# Patient Record
Sex: Male | Born: 1960
Health system: Southern US, Community
[De-identification: ages and names within clinical notes are randomized; demographics above are authoritative.]

## PROBLEM LIST (undated history)

## (undated) DIAGNOSIS — I509 Heart failure, unspecified: Secondary | ICD-10-CM

## (undated) DIAGNOSIS — K219 Gastro-esophageal reflux disease without esophagitis: Secondary | ICD-10-CM

## (undated) DIAGNOSIS — G4733 Obstructive sleep apnea (adult) (pediatric): Secondary | ICD-10-CM

## (undated) DIAGNOSIS — M1990 Unspecified osteoarthritis, unspecified site: Secondary | ICD-10-CM

## (undated) DIAGNOSIS — R809 Proteinuria, unspecified: Secondary | ICD-10-CM

## (undated) DIAGNOSIS — I10 Essential (primary) hypertension: Secondary | ICD-10-CM

## (undated) DIAGNOSIS — J45909 Unspecified asthma, uncomplicated: Secondary | ICD-10-CM

## (undated) DIAGNOSIS — N281 Cyst of kidney, acquired: Secondary | ICD-10-CM

## (undated) DIAGNOSIS — E785 Hyperlipidemia, unspecified: Secondary | ICD-10-CM

## (undated) HISTORY — DX: Gastro-esophageal reflux disease without esophagitis: K21.9

## (undated) HISTORY — DX: Proteinuria, unspecified: R80.9

## (undated) HISTORY — DX: Morbid (severe) obesity due to excess calories: E66.01

## (undated) HISTORY — PX: OTHER SURGICAL HISTORY: SHX169

## (undated) HISTORY — DX: Unspecified osteoarthritis, unspecified site: M19.90

## (undated) HISTORY — DX: Heart failure, unspecified: I50.9

## (undated) HISTORY — DX: Hyperlipidemia, unspecified: E78.5

## (undated) HISTORY — DX: Obstructive sleep apnea (adult) (pediatric): G47.33

---
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
## 2004-10-21 ENCOUNTER — Emergency Department (HOSPITAL_COMMUNITY): Admission: EM | Admit: 2004-10-21 | Discharge: 2004-10-21 | Payer: Self-pay | Admitting: Emergency Medicine

## 2006-11-06 ENCOUNTER — Emergency Department (HOSPITAL_COMMUNITY): Admission: EM | Admit: 2006-11-06 | Discharge: 2006-11-06 | Payer: Self-pay | Admitting: Emergency Medicine

## 2006-11-24 ENCOUNTER — Inpatient Hospital Stay (HOSPITAL_COMMUNITY): Admission: EM | Admit: 2006-11-24 | Discharge: 2006-12-01 | Payer: Self-pay | Admitting: Emergency Medicine

## 2006-11-24 ENCOUNTER — Encounter: Admission: RE | Admit: 2006-11-24 | Discharge: 2006-11-24 | Payer: Self-pay | Admitting: Orthopedic Surgery

## 2006-11-25 ENCOUNTER — Encounter: Payer: Self-pay | Admitting: Vascular Surgery

## 2006-11-25 ENCOUNTER — Ambulatory Visit: Payer: Self-pay | Admitting: Infectious Diseases

## 2006-11-25 ENCOUNTER — Ambulatory Visit: Payer: Self-pay | Admitting: Vascular Surgery

## 2006-11-26 ENCOUNTER — Encounter (INDEPENDENT_AMBULATORY_CARE_PROVIDER_SITE_OTHER): Payer: Self-pay | Admitting: *Deleted

## 2009-11-06 ENCOUNTER — Ambulatory Visit: Payer: Self-pay | Admitting: Internal Medicine

## 2009-11-06 ENCOUNTER — Inpatient Hospital Stay (HOSPITAL_COMMUNITY): Admission: AD | Admit: 2009-11-06 | Discharge: 2009-11-14 | Payer: Self-pay | Admitting: Internal Medicine

## 2009-11-07 ENCOUNTER — Encounter (INDEPENDENT_AMBULATORY_CARE_PROVIDER_SITE_OTHER): Payer: Self-pay | Admitting: Internal Medicine

## 2009-11-07 ENCOUNTER — Ambulatory Visit: Payer: Self-pay | Admitting: Vascular Surgery

## 2009-11-08 ENCOUNTER — Encounter (INDEPENDENT_AMBULATORY_CARE_PROVIDER_SITE_OTHER): Payer: Self-pay | Admitting: Internal Medicine

## 2010-02-15 ENCOUNTER — Ambulatory Visit (HOSPITAL_BASED_OUTPATIENT_CLINIC_OR_DEPARTMENT_OTHER): Admission: RE | Admit: 2010-02-15 | Discharge: 2010-02-15 | Payer: Self-pay | Admitting: Internal Medicine

## 2010-02-28 ENCOUNTER — Ambulatory Visit: Payer: Self-pay | Admitting: Pulmonary Disease

## 2010-12-02 LAB — URINALYSIS, MICROSCOPIC ONLY
Ketones, ur: NEGATIVE mg/dL
Nitrite: NEGATIVE
Specific Gravity, Urine: 1.013 (ref 1.005–1.030)
Urobilinogen, UA: 1 mg/dL (ref 0.0–1.0)

## 2010-12-02 LAB — CBC
HCT: 44 % (ref 39.0–52.0)
HCT: 44.5 % (ref 39.0–52.0)
HCT: 46.8 % (ref 39.0–52.0)
HCT: 49.5 % (ref 39.0–52.0)
HCT: 52.1 % — ABNORMAL HIGH (ref 39.0–52.0)
Hemoglobin: 12.8 g/dL — ABNORMAL LOW (ref 13.0–17.0)
Hemoglobin: 13.3 g/dL (ref 13.0–17.0)
Hemoglobin: 13.6 g/dL (ref 13.0–17.0)
Hemoglobin: 13.8 g/dL (ref 13.0–17.0)
Hemoglobin: 14.7 g/dL (ref 13.0–17.0)
Hemoglobin: 15.3 g/dL (ref 13.0–17.0)
MCHC: 29 g/dL — ABNORMAL LOW (ref 30.0–36.0)
MCHC: 29.4 g/dL — ABNORMAL LOW (ref 30.0–36.0)
MCHC: 29.4 g/dL — ABNORMAL LOW (ref 30.0–36.0)
MCHC: 29.5 g/dL — ABNORMAL LOW (ref 30.0–36.0)
MCHC: 30.4 g/dL (ref 30.0–36.0)
MCHC: 30.6 g/dL (ref 30.0–36.0)
MCV: 72.8 fL — ABNORMAL LOW (ref 78.0–100.0)
MCV: 72.9 fL — ABNORMAL LOW (ref 78.0–100.0)
MCV: 73.1 fL — ABNORMAL LOW (ref 78.0–100.0)
MCV: 73.2 fL — ABNORMAL LOW (ref 78.0–100.0)
MCV: 73.8 fL — ABNORMAL LOW (ref 78.0–100.0)
Platelets: 224 10*3/uL (ref 150–400)
Platelets: 233 10*3/uL (ref 150–400)
Platelets: 248 10*3/uL (ref 150–400)
Platelets: 261 10*3/uL (ref 150–400)
Platelets: 329 K/uL (ref 150–400)
Platelets: 332 10*3/uL (ref 150–400)
Platelets: 345 10*3/uL (ref 150–400)
Platelets: 354 10*3/uL (ref 150–400)
RBC: 6.22 MIL/uL — ABNORMAL HIGH (ref 4.22–5.81)
RBC: 6.27 MIL/uL — ABNORMAL HIGH (ref 4.22–5.81)
RBC: 6.62 MIL/uL — ABNORMAL HIGH (ref 4.22–5.81)
RBC: 7.13 MIL/uL — ABNORMAL HIGH (ref 4.22–5.81)
RBC: 7.15 MIL/uL — ABNORMAL HIGH (ref 4.22–5.81)
RDW: 23.3 % — ABNORMAL HIGH (ref 11.5–15.5)
RDW: 23.6 % — ABNORMAL HIGH (ref 11.5–15.5)
RDW: 23.9 % — ABNORMAL HIGH (ref 11.5–15.5)
RDW: 24 % — ABNORMAL HIGH (ref 11.5–15.5)
RDW: 24 % — ABNORMAL HIGH (ref 11.5–15.5)
RDW: 24 % — ABNORMAL HIGH (ref 11.5–15.5)
RDW: 25.2 % — ABNORMAL HIGH (ref 11.5–15.5)
WBC: 11.7 10*3/uL — ABNORMAL HIGH (ref 4.0–10.5)
WBC: 11.7 10*3/uL — ABNORMAL HIGH (ref 4.0–10.5)
WBC: 11.9 10*3/uL — ABNORMAL HIGH (ref 4.0–10.5)
WBC: 12.6 K/uL — ABNORMAL HIGH (ref 4.0–10.5)
WBC: 6.8 10*3/uL (ref 4.0–10.5)
WBC: 9.5 10*3/uL (ref 4.0–10.5)

## 2010-12-02 LAB — VITAMIN B12: Vitamin B-12: >2000 pg/mL — ABNORMAL HIGH (ref 211–911)

## 2010-12-02 LAB — LEGIONELLA ANTIGEN, URINE: Legionella Antigen, Urine: NEGATIVE

## 2010-12-02 LAB — URINALYSIS, ROUTINE W REFLEX MICROSCOPIC
Glucose, UA: NEGATIVE mg/dL
Ketones, ur: NEGATIVE mg/dL
Leukocytes, UA: NEGATIVE
Nitrite: NEGATIVE
Protein, ur: >300 mg/dL — AB
Specific Gravity, Urine: 1.016 (ref 1.005–1.030)
Urobilinogen, UA: 1 mg/dL (ref 0.0–1.0)
pH: 5 (ref 5.0–8.0)

## 2010-12-02 LAB — CARDIAC PANEL(CRET KIN+CKTOT+MB+TROPI)
CK, MB: 3.5 ng/mL (ref 0.3–4.0)
CK, MB: 5.5 ng/mL — ABNORMAL HIGH (ref 0.3–4.0)
CK, MB: 5.8 ng/mL — ABNORMAL HIGH (ref 0.3–4.0)
Relative Index: 2 (ref 0.0–2.5)
Total CK: 128 U/L (ref 7–232)
Total CK: 281 U/L — ABNORMAL HIGH (ref 7–232)
Troponin I: 0.47 ng/mL — ABNORMAL HIGH (ref 0.00–0.06)
Troponin I: 0.67 ng/mL (ref 0.00–0.06)

## 2010-12-02 LAB — PHOSPHORUS: Phosphorus: 5 mg/dL — ABNORMAL HIGH (ref 2.3–4.6)

## 2010-12-02 LAB — BASIC METABOLIC PANEL
BUN: 20 mg/dL (ref 6–23)
BUN: 29 mg/dL — ABNORMAL HIGH (ref 6–23)
BUN: 40 mg/dL — ABNORMAL HIGH (ref 6–23)
BUN: 46 mg/dL — ABNORMAL HIGH (ref 6–23)
BUN: 54 mg/dL — ABNORMAL HIGH (ref 6–23)
CO2: 40 mEq/L — ABNORMAL HIGH (ref 19–32)
CO2: 42 mEq/L (ref 19–32)
CO2: 44 mEq/L (ref 19–32)
Calcium: 7.8 mg/dL — ABNORMAL LOW (ref 8.4–10.5)
Calcium: 8.1 mg/dL — ABNORMAL LOW (ref 8.4–10.5)
Calcium: 8.4 mg/dL (ref 8.4–10.5)
Chloride: 90 mEq/L — ABNORMAL LOW (ref 96–112)
Chloride: 93 mEq/L — ABNORMAL LOW (ref 96–112)
Creatinine, Ser: 1.25 mg/dL (ref 0.4–1.5)
Creatinine, Ser: 1.42 mg/dL (ref 0.4–1.5)
Creatinine, Ser: 1.56 mg/dL — ABNORMAL HIGH (ref 0.4–1.5)
Creatinine, Ser: 1.66 mg/dL — ABNORMAL HIGH (ref 0.4–1.5)
GFR calc Af Amer: 58 mL/min — ABNORMAL LOW (ref >60)
GFR calc Af Amer: >60 mL/min (ref >60)
GFR calc non Af Amer: 38 mL/min — ABNORMAL LOW (ref >60)
GFR calc non Af Amer: 53 mL/min — ABNORMAL LOW (ref >60)
GFR calc non Af Amer: >60 mL/min (ref >60)
Glucose, Bld: 131 mg/dL — ABNORMAL HIGH (ref 70–99)
Glucose, Bld: 84 mg/dL (ref 70–99)
Glucose, Bld: 85 mg/dL (ref 70–99)
Glucose, Bld: 85 mg/dL (ref 70–99)
Potassium: 3.2 mEq/L — ABNORMAL LOW (ref 3.5–5.1)
Potassium: 3.3 mEq/L — ABNORMAL LOW (ref 3.5–5.1)
Sodium: 145 mEq/L (ref 135–145)

## 2010-12-02 LAB — RENAL FUNCTION PANEL
Albumin: 2.6 g/dL — ABNORMAL LOW (ref 3.5–5.2)
BUN: 64 mg/dL — ABNORMAL HIGH (ref 6–23)
BUN: 80 mg/dL — ABNORMAL HIGH (ref 6–23)
CO2: 32 mEq/L (ref 19–32)
CO2: 37 mEq/L — ABNORMAL HIGH (ref 19–32)
Calcium: 8.2 mg/dL — ABNORMAL LOW (ref 8.4–10.5)
Calcium: 8.3 mg/dL — ABNORMAL LOW (ref 8.4–10.5)
Chloride: 91 mEq/L — ABNORMAL LOW (ref 96–112)
Creatinine, Ser: 2.37 mg/dL — ABNORMAL HIGH (ref 0.4–1.5)
GFR calc Af Amer: 36 mL/min — ABNORMAL LOW (ref >60)
GFR calc non Af Amer: 27 mL/min — ABNORMAL LOW (ref >60)
GFR calc non Af Amer: 29 mL/min — ABNORMAL LOW (ref >60)
Glucose, Bld: 70 mg/dL (ref 70–99)
Glucose, Bld: 77 mg/dL (ref 70–99)
Phosphorus: 4.6 mg/dL (ref 2.3–4.6)
Potassium: 3.2 mEq/L — ABNORMAL LOW (ref 3.5–5.1)
Potassium: 3.5 mEq/L (ref 3.5–5.1)
Sodium: 143 mEq/L (ref 135–145)

## 2010-12-02 LAB — BLOOD GAS, ARTERIAL
Bicarbonate: 30.8 mEq/L — ABNORMAL HIGH (ref 20.0–24.0)
Bicarbonate: 30.8 mEq/L — ABNORMAL HIGH (ref 20.0–24.0)
Bicarbonate: 32 mEq/L — ABNORMAL HIGH (ref 20.0–24.0)
Expiratory PAP: 5
FIO2: 0.4 %
Inspiratory PAP: 12
O2 Saturation: 92.2 %
O2 Saturation: 93.3 %
Patient temperature: 98.6
Patient temperature: 98.6
TCO2: 33.3 mmol/L (ref 0–100)
TCO2: 33.9 mmol/L (ref 0–100)
pCO2 arterial: 87.2 mmHg (ref 35.0–45.0)
pO2, Arterial: 60.6 mmHg — ABNORMAL LOW (ref 80.0–100.0)
pO2, Arterial: 84.1 mmHg (ref 80.0–100.0)

## 2010-12-02 LAB — COMPREHENSIVE METABOLIC PANEL
ALT: 141 U/L — ABNORMAL HIGH (ref 0–53)
ALT: 80 U/L — ABNORMAL HIGH (ref 0–53)
ALT: 99 U/L — ABNORMAL HIGH (ref 0–53)
AST: 141 U/L — ABNORMAL HIGH (ref 0–37)
AST: 41 U/L — ABNORMAL HIGH (ref 0–37)
AST: 69 U/L — ABNORMAL HIGH (ref 0–37)
Albumin: 2.8 g/dL — ABNORMAL LOW (ref 3.5–5.2)
Albumin: 3.1 g/dL — ABNORMAL LOW (ref 3.5–5.2)
Albumin: 3.2 g/dL — ABNORMAL LOW (ref 3.5–5.2)
Alkaline Phosphatase: 100 U/L (ref 39–117)
Alkaline Phosphatase: 118 U/L — ABNORMAL HIGH (ref 39–117)
Alkaline Phosphatase: 124 U/L — ABNORMAL HIGH (ref 39–117)
CO2: 26 mEq/L (ref 19–32)
CO2: 28 mEq/L (ref 19–32)
Calcium: 7.4 mg/dL — ABNORMAL LOW (ref 8.4–10.5)
Calcium: 8.7 mg/dL (ref 8.4–10.5)
Chloride: 90 mEq/L — ABNORMAL LOW (ref 96–112)
Chloride: 92 mEq/L — ABNORMAL LOW (ref 96–112)
Chloride: 92 mEq/L — ABNORMAL LOW (ref 96–112)
Creatinine, Ser: 5.03 mg/dL — ABNORMAL HIGH (ref 0.4–1.5)
Creatinine, Ser: 5.2 mg/dL — ABNORMAL HIGH (ref 0.4–1.5)
GFR calc Af Amer: 13 mL/min — ABNORMAL LOW (ref >60)
GFR calc Af Amer: 14 mL/min — ABNORMAL LOW (ref >60)
GFR calc Af Amer: 15 mL/min — ABNORMAL LOW (ref >60)
GFR calc non Af Amer: 10 mL/min — ABNORMAL LOW (ref >60)
GFR calc non Af Amer: 12 mL/min — ABNORMAL LOW (ref >60)
Glucose, Bld: 122 mg/dL — ABNORMAL HIGH (ref 70–99)
Potassium: 4.2 mEq/L (ref 3.5–5.1)
Potassium: 4.4 mEq/L (ref 3.5–5.1)
Potassium: 4.9 mEq/L (ref 3.5–5.1)
Sodium: 133 mEq/L — ABNORMAL LOW (ref 135–145)
Sodium: 133 mEq/L — ABNORMAL LOW (ref 135–145)
Sodium: 135 mEq/L (ref 135–145)
Total Bilirubin: 2 mg/dL — ABNORMAL HIGH (ref 0.3–1.2)
Total Bilirubin: 2.1 mg/dL — ABNORMAL HIGH (ref 0.3–1.2)
Total Protein: 6.1 g/dL (ref 6.0–8.3)
Total Protein: 6.6 g/dL (ref 6.0–8.3)
Total Protein: 6.7 g/dL (ref 6.0–8.3)

## 2010-12-02 LAB — HEPARIN LEVEL (UNFRACTIONATED)
Heparin Unfractionated: 0.1 [IU]/mL — ABNORMAL LOW (ref 0.30–0.70)
Heparin Unfractionated: 0.36 IU/mL (ref 0.30–0.70)
Heparin Unfractionated: 0.46 IU/mL (ref 0.30–0.70)
Heparin Unfractionated: 0.47 IU/mL (ref 0.30–0.70)
Heparin Unfractionated: 1.07 IU/mL — ABNORMAL HIGH (ref 0.30–0.70)
Heparin Unfractionated: <0.1 IU/mL — ABNORMAL LOW (ref 0.30–0.70)

## 2010-12-02 LAB — URINE MICROSCOPIC-ADD ON

## 2010-12-02 LAB — HEMOGLOBINOPATHY EVALUATION
Hemoglobin Other: 0 % (ref 0.0–0.0)
Hgb A: 97.9 % — ABNORMAL HIGH (ref 96.8–97.8)

## 2010-12-02 LAB — LIPID PANEL
LDL Cholesterol: 68 mg/dL (ref 0–99)
Total CHOL/HDL Ratio: 4.5 RATIO
Triglycerides: 134 mg/dL (ref <150)
VLDL: 27 mg/dL (ref 0–40)

## 2010-12-02 LAB — RETICULOCYTES
RBC.: 6.44 MIL/uL — ABNORMAL HIGH (ref 4.22–5.81)
Retic Count, Absolute: 77.3 10*3/uL (ref 19.0–186.0)
Retic Ct Pct: 1.2 % (ref 0.4–3.1)

## 2010-12-02 LAB — IRON AND TIBC
Iron: 23 ug/dL — ABNORMAL LOW (ref 42–135)
Saturation Ratios: 8 % — ABNORMAL LOW (ref 20–55)
TIBC: 293 ug/dL (ref 215–435)
UIBC: 270 ug/dL

## 2010-12-02 LAB — MAGNESIUM
Magnesium: 1.2 mg/dL — ABNORMAL LOW (ref 1.5–2.5)
Magnesium: 1.6 mg/dL (ref 1.5–2.5)
Magnesium: 1.9 mg/dL (ref 1.5–2.5)

## 2010-12-02 LAB — STREP PNEUMONIAE URINARY ANTIGEN: Strep Pneumo Urinary Antigen: NEGATIVE

## 2010-12-02 LAB — URINE CULTURE

## 2010-12-02 LAB — CULTURE, BLOOD (ROUTINE X 2)

## 2010-12-02 LAB — BRAIN NATRIURETIC PEPTIDE: Pro B Natriuretic peptide (BNP): 339 pg/mL — ABNORMAL HIGH (ref 0.0–100.0)

## 2011-01-24 NOTE — Procedures (Signed)
EEG#:  [id number].   CLINICAL HISTORY:  A 50 year old man with a history of fall.  EEG is  performed for evaluation.  This is a portable EEG done without photic  stimulation or hyperventilation.  The patient is described as awake.   ATTENDING PHYSICIAN:  Dr. Waymon Amato.   DESCRIPTION:  The dominant rhythm of this tracing is a moderate  amplitude alpha rhythm of 9-10 Hz which predominates posteriorly without  abnormal asymmetry and attenuates with eye opening and closing.  Abundant low-amplitude fast activity is seen frontally and centrally and  it appears without abnormal asymmetry.  No focal slowing is noted.  No  epileptiform discharges are seen.  The patient remained in the awake  state throughout the recording.  Photic stimulation and hyperventilation  were not performed.  A single-channel devoted EKG revealed sinus rhythm  throughout with a rate of approximately 96 beats per minute.   CONCLUSIONS:  A normal study in awake state.      Michael L. Thad Ranger, M.D.  Electronically Signed     BJY:NWGN  D:  11/26/2006 14:07:00  T:  11/26/2006 14:23:09  Job #:  [id number]

## 2011-01-24 NOTE — Consult Note (Signed)
NAME:  [name], [name] NO.:  [id number]   MEDICAL RECORD NO.:  [id number]          PATIENT TYPE:  INP   LOCATION:  0103                         FACILITY:  [facility]   PHYSICIAN:  Marcellus Scott, MD     DATE OF BIRTH:  1961/02/03   DATE OF CONSULTATION:  DATE OF DISCHARGE:                                 CONSULTATION   IDENTIFICATION:  The patient is unassigned to the [facility].  [facility]   Referring physician is Georges Lynch. Darrelyn Hillock, M.D., orthopedics.   CHIEF COMPLAINT:  right lower extremity pain, redness, swelling, and  fever.   HISTORY OF PRESENT ILLNESS:  The patient is a 50 year old pleasant  African-American male patient with past medical history of untreated  hypertension, childhood asthma, obesity.  He has been admitted by the  orthopedic team for evaluation and management of his right lower  extremity cellulitis.  The patient apparently fell and hurt his right  lower extremity, the anterior aspect, on a sharp metal spike close to 2  weeks ago.  Since then, he has been treated with courses of antibiotics;  however, the patient's lower extremity continues to be painful, swollen,  hot, and red, with associated fever.  So, patient has been evaluated by  orthopedics and admitted to rule out an abscess versus DVT.  And we have  been asked to consult for his untreated hypertension.   PAST MEDICAL HISTORY:  1. Hypertension of 20 years' duration.  The patient says he was given      some kind of prescription for antihypertensive medication more than      a year ago but does not remember the name and never took it.  2. Asthma since childhood, but infrequent attacks.  Uses Primatene      mist infrequently.  Never been intubated.  3. Obesity.   PAST SURGICAL HISTORY:  Right wrist fracture surgery.   ALLERGIES:  THERE ARE NO KNOWN DRUG ALLERGIES.   MEDICATIONS:  Primatene mist p.r.n.   FAMILY HISTORY:  The patient has a  brother with history of diabetes.  The patient's mother with history of diabetes.   SOCIAL HISTORY:  The patient is single.  Works as a Glass blower/designer at [employer].  Denies tobacco use.  Social alcohol intake and no abuse of  drugs.   REVIEW OF SYSTEMS:  HEENT:  No headache or visual disturbances.  GENERAL:  With fever.  No chills or rigors.  RESPIRATORY:  With no  dyspnea, wheezing, chest tightness, cough.  CARDIOVASCULAR SYSTEM:  No  chest pain, palpitations, orthopnea, dyspnea.  ABDOMEN/GI:  No nausea,  vomiting, abdominal pain, constipation, or diarrhea.  GENITOURINARY:  With no frequency or dysuria.  CENTRAL NERVOUS SYSTEM:  Noncontributory.  EXTREMITIES:  Per the history of presenting illness.  The patient also  with history of leg swelling intermittently.   PHYSICAL EXAMINATION:  GENERAL:  The patient is a morbidly obese young  male patient in no obvious cardiopulmonary or painful distress.  VITAL SIGNS:  His current vital signs are temperature of 98.5 degrees  Fahrenheit, blood pressure of 223/122 mmHg with an appropriate sized  cuff, right arm, pulse of 88 per minute, respirations 22 per minute,  saturating at 94% on room air.  HEENT:  Nontraumatic, normocephalic.  Pupils equally reacting to light  and accommodation.  Fundus visualized, with questionable AV nicking, but  no hemorrhages, no papilledema.  Oral cavity without any pharyngeal  edema.  NECK:  Neck is thick.  No JVD, carotid bruit, lymphadenopathy, or  goiter. Supple  RESPIRATORY SYSTEM:  Clear to auscultation bilaterally.  CARDIOVASCULAR SYSTEM:  First and 2nd heart sounds heard.  No 3rd or 4th  heart sounds.  No murmurs, rubs, gallops, or clicks.  ABDOMEN:  Abdomen is obese, nontender.  No organomegaly or mass.  Bowel  sounds are preserved.  NEUROLOGIC:  awake, alert, and oriented x3, with no focal deficits.  EXTREMITIES:  His right lower extremity is swollen below the knee with a  wound on the anterior aspect just  below the knee with eschar.  The  patient's leg is also red, warm, tender.  Peripheral pulses are  symmetrically felt.  The patient's left lower extremity with 1+ pitting  edema.  SKIN:  The skin is without any rashes.   LABORATORY DATA:  A CBC with a hemoglobin of 11.9, hematocrit 36.9, MCV  of 78, WBC of 11.4, platelet count of 403,000.  Coagulation index is  normal.  Basic metabolic panel essentially BUN is 20, creatinine of 1.1.  Hepatic panel remarkable for albumin of 3.  Chest x-ray with impression  of cardiomegaly, but otherwise clear.  EKG with normal sinus rhythm,  possible left atrial enlargement, some T inversions in first 1 and aVL.   ASSESSMENT AND PLAN:  1. Hypertensive urgency.  Will suggest admitting the patient to step-      down.  Will obtain echocardiogram, urinary toxicology, urinalysis,      thyroid-stimulating hormone.  Will provide the patient with a stat      dose of intravenous Lasix and then place the patient on a      nicardipine drip and titrate the blood pressure.  Will also place      the patient on an angiotensin-converting enzyme inhibitor and      monitor BP closely.  2. Asthma, which is stable. For oxygen and nebulizations p.r.n.  3. Morbid obesity. Councilled regarding weight loss, diet, and      exercise as an outpatient.  4. Microcytic anemia.  Will obtain an iron panel and hemoglobin      electrophoresis.  5. Deep vein thrombosis prophylaxis per the primary team.  6. Right lower extremity cellulitis, R/O Abscess and DVT = management      per the primary team.      Marcellus Scott, MD  Electronically Signed     AH/MEDQ  D:  11/25/2006  T:  11/25/2006  Job:  [id number]   cc:   Georges Lynch. Darrelyn Hillock, M.D.  Fax: [phone]

## 2011-01-24 NOTE — H&P (Signed)
NAME:  [name], [name] NO.:  [id number]   MEDICAL RECORD NO.:  [id number]          PATIENT TYPE:  EMS   LOCATION:  ED                           FACILITY:  [facility]   PHYSICIAN:  Georges Lynch. Gioffre, M.D.DATE OF BIRTH:  [birth year]   DATE OF ADMISSION:  11/24/2006  DATE OF DISCHARGE:                              HISTORY & PHYSICAL   CHIEF COMPLAINT:  Painful, nonhealing ulcer of right lower extremity.   HISTORY OF PRESENT ILLNESS:  The patient is a 50 year old gentleman here  today for a follow-up evaluation of his right lower extremity.  The  patient was initially seen on November 17, 2006, for evaluation of an  injury to his right lower extremity.  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  He was out on the docks about November 08, 2006, when  he stepped between the dumpster and the loading dock, when his leg fell  down and he slipped and he cut his anterior lateral aspect of his  proximal tibia on a sharp metal spike.  The patient went to the  emergency room at [facility] and was placed on an antibiotic  and was given a tetanus shot.  He then returned to the [facility] clinic for  evaluation and had it changed over to Levaquin, and he continues to have  swelling and issues related to the wound on his right lower extremity.  The patient states his leg is significantly swollen.  It is hot.  It is  painful, but he denies any fevers or chills, and he has completed the  Levaquin today.  He denies any other injuries.   CURRENT MEDICATIONS:  1. Just completed Levaquin  2. Primatene Mist   PAST MEDICAL HISTORY:  1. Asthma.  2. Hypertension.  3. Obesity.   ALLERGIES:  NO KNOWN DRUG ALLERGIES.   PAST SURGICAL HISTORY:  Closed reduction of a fracture of the right  wrist   SOCIAL HISTORY:  The patient is single and lives in a three-bedroom  house.  Currently is not working due to this injury. Denies any tobacco  use.  Has an occasional alcoholic beverage,  mostly alcohol on weekends  and denies any drug use.   REVIEW OF SYSTEMS:  Review of systems is negative for any fevers or  chills.  No nausea or vomiting.  He does occasionally loose breath with  nerves, for which she uses Primatene Mist. He has never been  hospitalized.  He denies any cardiac symptoms like chest pain, rapid  heart beats or skipping beats.  He denies any GI or GU issues.  He  denies any neurologic issues.  He denies any diabetes.   PHYSICAL EXAMINATION:  VITAL SIGNS:  Temperature is 98.3, blood pressure  is 220/120, pulse of 74 and regular, respirations 14 and unlabored.  GENERAL:  The patient is not septic appearing.  This is a morbidly obese  male near his stated age.  He ambulates with the use of a walker.  He  has an obviously significantly swollen right lower extremity.  HEENT:  Head was  normocephalic.  Pupils equal, round, reactive.  Gross  hearing is intact.  NECK:  Was supple.  No palpable lymphadenopathy.  He had excellent range  of motion, without any discomfort.  CHEST:  Lung sounds were distant but clear and equal.  No wheezes.  HEART:  Regular rate and rhythm.  No murmurs, rubs or gallops.  ABDOMEN:  The abdomen was obese, soft and nontender.  EXTREMITIES:  Upper extremities were symmetric in size and shape.  He  had excellent range of motion of his shoulders, elbows and wrists.  Lower extremities:  He had a good range of motion of both hips.  He had  painful range of motion of both knees.  He has had arthritic issues.  They are significantly obese in the soft tissues over the proximal  aspects.  His right lower extremity was twice the size of his left lower  extremity.  It was very tense and swollen with diffuse erythema.  It was  very tender and tight throughout.  On the anterior, proximal, lateral  aspect he had a large abrasion area, probably about three to four inches  in diameter and a large area of eschar in the middle.  It had a weeping   bloody-type drainage from the very distal aspect.  PERIPHERAL VASCULAR:  The carotid pulses were 2+, radial pulses 2+,  dorsalis pedis pulses were 1+.  NEUROLOGIC:  The patient was grossly conscious, alert and appropriate.  He had no gross neurologic defects.  He had excellent sensation in his  lower extremities. distal to his swelling on the right and in his foot  on the left.  BREASTS/RECTAL/GENITOURINARY:  Exams were deferred.   IMPRESSION:  1. Swollen, red, painful, nonhealing ulcer, right lower extremity,      questionable deep abscess versus deep venous thrombosis, not      improving with Ancef or Levaquin.  2. Hypertension, uncontrolled and not treated.  3. Morbid obesity.  4. Asthma, self-treating with Primatene Mist.   PLAN:  At this time Dr. Georges Lynch. Gioffre is concerned about his right  lower extremity.  He is concerned about a deep abscess, versus possible  DVT and not improving with the current oral antibiotic treatment.  The  patient denies any diabetes status.  That is also a concern.  So at this  time we have made arrangements for him to go to [facility] and get a  stat creatinine and then go to the [facility] with an open scanner, to get an  MRI with contrast of his right lower extremity. to rule out a deep  abscess of his right lower extremity.  We are having this done as an  outpatient, due to the fact that he is morbidly obese.  He is greater  than 400 pounds.  For his waistline he uses a size 68.  And then the  patient is to present to the [facility] Emergency Room where  he is going to start his initial workup with routine blood work, chest x-  ray and EKG and get an IV in him and start on some Ancef routine.  We  are also going to contact the next-up hospitalists' service for a  medical evaluation due to his hypertension, and we are also going to  contact infectious disease services for evaluation for  recommendations on further treatments and  antibiotics for this issue.  The patient is  aware of our concerns at this time, as he is also concerned.  Further  treatments will be decided according to the current workup which is  planned.      Jamelle Rushing, P.A.    ______________________________  Georges Lynch Darrelyn Hillock, M.D.    RWK/MEDQ  D:  11/24/2006  T:  11/24/2006  Job:  [id number]

## 2011-01-24 NOTE — Discharge Summary (Signed)
[name], [name]                ACCOUNT NO.:  [id number]   MEDICAL RECORD NO.:  [id number]          PATIENT TYPE:  INP   LOCATION:  1431                         FACILITY:  [facility]   PHYSICIAN:  RONALD A. GIOFFRE, M.D.DATE OF BIRTH:  06-May-1961   DATE OF ADMISSION:  11/24/2006  DATE OF DISCHARGE:  12/01/2006                               DISCHARGE SUMMARY   INITIAL DIAGNOSIS:  1. Swollen right lower extremity, rule-out cellulitis versus deep soft      tissue abscess infection versus deep venous thrombosis.  2. Uncontrolled hypertension.  3. Morbid obesity.  4. Asthma, self-treated with Primatene Mist.   DISCHARGE DIAGNOSES:  1. Cellulitis right lower extremity with deep soft tissue hematoma,      treated readmission several weeks with p.o. antibiotics.  Cultures      were negative at the time of incision and drainage. Continue to be      treated as an outpatient on discharge with Augmentin.  2. Uncontrolled hypertension, now being treated with p.o.      antihypertensive medications.  3. Morbid obesity.  4. Asthma.   HISTORY OF PRESENT ILLNESS:  Patient is a 50 year old gentleman who was  admitted  to [facility] for evaluation and treatment of his  right lower extremity.  Patient was injured March 2nd when he stepped  off the loading docks causing a laceration to the interior of the  lateral calf on the right.  Patient was treated through the Emergency  Room Urgent Care with p.o. antibiotics.  Presented on the day of  admission to Dr. Jeannetta Ellis office for evaluation with a significantly  swollen, red, hot, tense, infected right lower extremity.  The patient  was admitted for evaluation and possible deep abscess versus cellulitis  versus a DVT.  Due to the patient's significant morbid obesity  arrangements were made for the patient as an outpatient to get his MRI  scan of his right lower extremity at an open scanner, then admitted to  have his further work-up  completed.   ADMISSION MEDICATIONS:  1. Levaquin.  2. Primatene mist.   ALLERGIES:  NO KNOWN DRUG ALLERGIES.   SURGICAL PROCEDURES:  November 27, 2006 the patient was taken to the O. R.  by Dr. Worthy Rancher.  Under general anesthesia had a I & D and subsequent  deep wound hematoma debridement.  Cultures were sent for evaluation.  There were no complications.  Patient was transferred from the recovery  room back to the regular flood bed without any further complications,  still on I.V. pain medicines and antibiotics.   CONSULTATIONS:  The following consulted were requested:  1. An [facility] hospitalist consult was requested for evaluation and      treatment of the patient's uncontrolled hypertension and multiple      other medical issues.  2. Infectious disease consult was requested for evaluation of      patient's wound and I.V. antibiotic treatment recommendations.  3. A wound care consult was also requested for evaluation of      appropriate visceral wound VAC post debridement.   HOSPITAL COURSE:  On November 24, 2006, the patient was admitted to [facility] under the care of Dr. Worthy Rancher for evaluation of his  right lower extremity for a possible cellulitis, deep wound abscess,  DVT.  A [facility] hospitalist consult was requested due to the fact the  patient had uncontrolled hypertension on admission. In the office he was  220/120.  His weight was 400 pounds and the patient did not have a  primary care physician.  The patient was evaluated for this, placed on  p.o. antihypertensive medications along with further work-up including  an echo, TSH, urinary evaluation for his multiple medical issues.  The  patient' MRI report was received.  He was found to have a deep area of  outline by 2 cm of deep within the soft tissue with enhanced fluid.  It  was questioned whether it was an abscess versus deep hematoma and upon  infectious disease consults they recommended to have the  patient be on  Unasyn until further cultures and information was obtained and they also  recommended that the patient have an I & D in case it is a deep abscess.  The patient was then subsequently taken to the O. R. where an I & D was  performed and found to be a deep hematoma, but cultures were sent to the  lab for further evaluation for cultures.  The patient tolerated the I &  D well.  He did have improvement of his discomfort in his right lower  extremity after the I & D of the decompression.  Patient did have some  labile blood pressure issues during his hospitalization and he had his  medications adjusted.  For a while there he was on I.V. antihypertensive  medications and they were able to get him under control and adjusted to  where it was stable with p.o. medications.  The patient had his  echocardiogram completed.  Patient was evaluated by wound care services  and they did recommend that the patient would benefit from a wound VAC,  so arrangements were made.  The patient had a wound VAC placed.  Patient's cultures came back negative, probably due to the fact that he  had been on long term antibiotics prior to his debridement and ID  recommended him to be on Augmentin as an outpatient.  The patient's  blood pressures became controlled and medical services recommended  appropriate p.o. antihypertensive treatments and the patient was  tolerating the wound VAC with improved clinical symptoms of his lower  extremity cellulitis, so arrangements were made for patient to be  discharged to home with Genevieve Norlander as an outpatient home health therapist  and wound VAC management.  The patient was discharged with  recommendations on following with Dr. Darrelyn Hillock.  He did not get any  recommendations to follow-up as an outpatient from the [facility]  personnel, other than he needs to find a physician and ID made the recommendations on treatment of antibiotics and its duration.  The  patient was  discharged to home with the above mentioned recommendations.   LABORATORY DATA:  CBC on admission found WBC's 11.4, hemoglobin 11.9,  hematocrit 36.9, platelets 403.  On March 25th, WBC's were 10.1,  hemoglobin 10.7, hematocrit 33.3, platelets 288.  Sed rate on March 18th  was 38 with a C-reactive protein of 2.2.  On March 19th a TSH was 1.265,  a hemoglobin A1C was 6.2, iron anemia studies found iron at 28, TIBC  331, a percent sat  8, UIBC was 303, B12 was 818, folate serum 11.9,  ferritin 298.  INR during his hospitalization remained at 1.1.  Routine  chemistries on admission had a sodium of 138, potassium 3.9, chloride  101, C02 33, glucose 118, BUN 20, creatinine 1.21. On March 23rd sodium  was 136, potassium 3.4, chloride 93, C02 39, glucose 108, BUN 13,  creatinine 1.32.  Estimated GFR was greater than 60 through his  hospitalization, the last day it dropped to 59.  All the cultures, both  gram stain abscess culture, aerobic and anaerobic showed no growths.   Noninvasive vascular study, venous Doppler showed no obvious evidence of  DVT, superficial thrombus or Baker's cyst.   Echocardiogram showed over left ventricular systolic function was  normal.  Left ventricular ejection fraction was estimated between 55-  60%.  The study was inadequate evaluation of left ventricular regional  wall motion.  Left ventricular wall thickness was moderately and  markedly increased.  Features were consistent with pseudo normal left  ventricular filling pattern with component of abnormal relaxation and  increased filling pressures.  The study was inadequate to rule-out the  possibility of bicuspid aortic valve.  The right ventricle was mildly  dilated.  Estimated peak right ventricular systolic pressure was mildly  increased.  Estimated peak right ventricular systolic pressure was in  the range of 40-45.  Right atrium was mildly dilated and this was  interpreted by Dr. Rosine Abe.    Admission electrocardiogram was sinus rhythm with premature atrial  complexes and 92 beats per minute.   DISCHARGE INSTRUCTIONS:  1. Activity.  Patient is to do as little as activity as needed.      Patient is to have wound VAC on at all times.  2. Diet. Patient has no restrictions.  3. Wound care.  [facility] will be changing      dressings on the standard protocol.  4. Follow-up with Dr. Darrelyn Hillock one week from date of discharge.      Patient is to call [phone].  No medical follow-up recommendations      were provided by the [facility] personnel.   MEDICATIONS:  1. Coumadin 5 mg once daily.  2. Percocet 10/650 1 tablet every four hours for pain if needed.  3. Reglan 10 mg 1 tablet every eight hours if needed.  4. Robaxin 500 mg every six hours if needed.  5. Coreg 25 mg p.o. twice daily for blood pressure.  6. Norvasc 10 mg p.o. twice daily for blood pressure. 7. Lisinopril 10 mg p.o. twice daily.  8. Senokot S 1 tablet twice daily.  9. Augmentin 875 mg twice daily for ten days.  10.Hydrochlorothiazide 12.5 mg once daily.  11.Clindamycin 600 mg twice daily for ten days.  12.Hematinic Plus 1 tablet a day.   CONDITION ON DISCHARGE:  Patient's condition on discharge to home was  listed improved, good.      Jamelle Rushing, P.A.    ______________________________  Jacki Cones, M.D.    RWK/MEDQ  D:  12/23/2006  T:  12/23/2006  Job:  [id number]   cc:   Windy Fast A. Darrelyn Hillock, M.D.  Fax: [phone]

## 2011-01-24 NOTE — Op Note (Signed)
NAME:  [name], [name] NO.:  [id number]   MEDICAL RECORD NO.:  [id number]          PATIENT TYPE:  INP   LOCATION:  1605                         FACILITY:  [facility]   PHYSICIAN:  Georges Lynch. Gioffre, M.D.DATE OF BIRTH:  10/25/1960   DATE OF PROCEDURE:  11/26/2006  DATE OF DISCHARGE:                               OPERATIVE REPORT   SURGEON:  Georges Lynch. Darrelyn Hillock, M.D.   ASSISTANT:  Nurse.   PREOPERATIVE DIAGNOSIS:  The patient had deep laceration to the anterior  aspect of his right proximal tibia region.  He was initially treated as  an outpatient and he was started on Keflex apparently by the outpatient  facility then Levaquin.  Because of the swelling of his leg and  questionable infection,  he was admitted by me and also he was admitted  because of a severe uncontrollable hypertension.  For the preop  diagnosis I mentioned deep laceration right leg, rule out possibility of  an abscess right leg.   POSTOPERATIVE DIAGNOSIS:  Deep laceration right anterior leg with  subcutaneous hematoma.   OPERATION:  1. Incision drainage of the right proximal leg.   PROCEDURE:  Under general anesthesia, routine orthopedic prep was  carried out of the right leg followed by a DuraPrep of the right leg.  At this time an incision was made around the eschar area.  Portion of  the eschar was debrided.  Following this immediately upon going down in  the subcutaneous space, subcutaneous space was completely filled with  hematoma, clot and hematoma.  I then thoroughly irrigated the area out  with a water pick in all directions.  There was absolutely no purulent  material present.  Prior to doing the irrigation I did take aerobic and  anaerobic cultures as well as Gram's stain.  Thoroughly irrigated out  the wound, packed the wound open with Iodoform gauze.  Sterile dressings  were applied.  The patient will be admitted to the ICU in order to  monitor him on a blood pressure medication drip  for his blood pressure  since he had an extremely high pressure.           ______________________________  Georges Lynch. Darrelyn Hillock, M.D.     RAG/MEDQ  D:  11/26/2006  T:  11/26/2006  Job:  [id number]

## 2012-08-17 ENCOUNTER — Ambulatory Visit (INDEPENDENT_AMBULATORY_CARE_PROVIDER_SITE_OTHER): Payer: Self-pay | Admitting: General Surgery

## 2012-08-18 ENCOUNTER — Ambulatory Visit (INDEPENDENT_AMBULATORY_CARE_PROVIDER_SITE_OTHER): Payer: Medicare Other | Admitting: Surgery

## 2012-08-18 ENCOUNTER — Encounter (INDEPENDENT_AMBULATORY_CARE_PROVIDER_SITE_OTHER): Payer: Self-pay | Admitting: Surgery

## 2012-08-18 ENCOUNTER — Ambulatory Visit (INDEPENDENT_AMBULATORY_CARE_PROVIDER_SITE_OTHER): Payer: Self-pay | Admitting: Surgery

## 2012-08-18 VITALS — BP 132/80 | HR 82 | Temp 97.8°F | Resp 18 | Ht 68.5 in | Wt 243.2 lb

## 2012-08-18 DIAGNOSIS — K649 Unspecified hemorrhoids: Secondary | ICD-10-CM | POA: Insufficient documentation

## 2012-08-18 NOTE — Progress Notes (Signed)
Re:   [name] DOB:   12/20/1960 MRN:   [id number]  ASSESSMENT AND PLAN: 1.  Rectal pain - posterior anal fissure  Continue Anusol HC supp.  I gave prescription for Diltiazem 2% cream to apply QID.  He should do sitz baths 2 to 3 x day.  I can see him back in 2 or 3 months, if no better.  We also talked about needing a colonoscopy, if he has never had one.  I would wait until the fissure heals before getting this.  He is going to check his records, since he thinks that he has had one.  He sees Dr. Felipa Eth in March - and can go over these recommendations with him.  2.  HTN 3.  DJD of both knees  This led to disability in 2011 from work. 4.  Conjestive heart failure/respiratory failure March 2011 5.  Significant weight loss (>200 pounds) over last 2 years 6.  GERD 7.  On disability.  Chief Complaint  Patient presents with  . New Evaluation    throm hems   REFERRING PHYSICIAN: Hoyle Sauer, MD  HISTORY OF PRESENT ILLNESS: [name] is a 51 y.o. (DOB: [birth year])  AA male whose primary care physician is Hoyle Sauer, MD and comes to me today for rectal pain and possible hemorrhoids.  He had some trouble last year.  But it got better. Then about 2 weeks, he had a hard BM and develped rectal pain, particularly with bowel movements.  He saw [name], FNP, on 08/16/12, who referred him to our office.  He has GERD.  No other significant GI history.  He thinks that he had a colonoscopy, but cannot remember the year or the physician who did the test.  The patient has no history of stomach disease.  No history of liver disease or gall bladder disease.  No history of pancreas disease.  No history of colon disease.    Past Medical History  Diagnosis Date  . CHF (congestive heart failure)      No past surgical history on file.    Current Outpatient Prescriptions  Medication Sig Dispense Refill  . aspirin 81 MG tablet Take 81 mg by mouth daily.      Marland Kitchen BYSTOLIC 10 MG  tablet       . furosemide (LASIX) 40 MG tablet       . omeprazole (PRILOSEC) 20 MG capsule          No Known Allergies  REVIEW OF SYSTEMS: Skin:  Excess skin from weight loss Infection:  No history of hepatitis or HIV.  No history of MRSA. Neurologic:  No history of stroke.  No history of seizure.  No history of headaches. Cardiac:  History of hypertensioin.  Had CHF in 2011, but since weight loss, has done much better. Pulmonary:  Does not smoke cigarettes.  No asthma or bronchitis.  No OSA/CPAP.  Endocrine:  No diabetes. No thyroid disease. Gastrointestinal:  See HPI.  GERD. Urologic:  No history of kidney stones.  No history of bladder infections. Musculoskeletal:  DJD in both knees that he said led to his disability. Hematologic:  No bleeding disorder.  No history of anemia.  Not anticoagulated. Psycho-social:  The patient is oriented.   The patient has no obvious psychologic or social impairment to understanding our conversation and plan.  SOCIAL and FAMILY HISTORY: Unmarried.  No children. On disability since 2011.  He worked in a [employer] before disability.  PHYSICAL  EXAM: BP 132/80  Pulse 82  Temp 97.8 F (36.6 C) (Temporal)  Resp 18  Ht 5' 8.5" (1.74 m)  Wt 243 lb 3.2 oz (110.315 kg)  BMI 36.44 kg/m2  General: WN AA M who is alert and generally healthy appearing.  HEENT: Normal. Pupils equal. Neck: Supple. No mass.  No thyroid mass. Lymph Nodes:  No supraclavicular or cervical nodes. Lungs: Clear to auscultation and symmetric breath sounds. Heart:  RRR. No murmur or rub. Abdomen: Soft.No hernia. Normal bowel sounds.  No abdominal scars.  A lot of redundant skin, consistent with weight loss history. Rectal: Posterior anal fissure with sentinel tag.  He has only moderate anal spasm.  I could feel no other significant mass.  He really has almost no hemorrhoid disease. Extremities:  Walks a little gimpy because of his knees. Neurologic:  Grossly intact to motor and  sensory function. Psychiatric: Has normal mood and affect. Behavior is normal.   DATA REVIEWED: Notes from Fannie Knee Drinkard.  [name], MD,  [facility] [address]  [address]   [place], [place]    [address] Phone:  [phone] FAX:  [phone]

## 2013-02-22 ENCOUNTER — Ambulatory Visit (INDEPENDENT_AMBULATORY_CARE_PROVIDER_SITE_OTHER): Payer: Medicare Other | Admitting: Surgery

## 2013-02-22 ENCOUNTER — Encounter (INDEPENDENT_AMBULATORY_CARE_PROVIDER_SITE_OTHER): Payer: Self-pay | Admitting: Surgery

## 2013-02-22 VITALS — BP 126/74 | HR 72 | Temp 98.2°F | Resp 15 | Ht 68.0 in | Wt 242.4 lb

## 2013-02-22 DIAGNOSIS — K649 Unspecified hemorrhoids: Secondary | ICD-10-CM

## 2013-02-22 NOTE — Progress Notes (Signed)
Re:   BRELAN HANNEN DOB:   28-Dec-1960 MRN:   [id number]  Urgent Office  ASSESSMENT AND PLAN: 1.  Rectal pain - posterior anal fissure, recurrent.  He has Anusol HC supp.  I gave prescription for Diltiazem 2% cream to apply QID last visit, but he never used it.  He'll try to use it this time.  He should do sitz baths 2 to 3 x day.  I gave him a book on rectal disease/fissures.  I will see him back in 1 month.  I told him that I did want to re-examine him.  We also talked about needing a colonoscopy, he has never had one.  I would wait until the fissure heals before getting this.    Plan to return to see me in a month.  2.  HTN 3.  DJD of both knees  This led to disability in 2011 from work. 4.  Conjestive heart failure/respiratory failure March 2011 5.  Significant weight loss (>200 pounds) over last 2 years 6.  GERD 7.  On disability.  Chief Complaint  Patient presents with  . Hemorrhoids    eval hems   REFERRING PHYSICIAN: Hoyle Sauer, MD  HISTORY OF PRESENT ILLNESS: PERLE GIBBON is a 52 y.o. (DOB: 06/22/1961)  AA male whose primary care physician is Hoyle Sauer, MD and comes to me today for recurrent rectal pain.  I saw Mr. Ferrara in Dec 2013.  At that time, I thought that he had a posterior anal fissure.  He did some sitz baths, used suppositories, and his pain resolved in about one week.  He takes miralax for his bowels.  He has a BM about every other day.  He's had no recent constipation or diarrhea.   Then about one week ago, he had recurrent rectal pain.  This is similar to what he had before.  He thought it may be hemorrhoids.  He noticed no change in his bowel habits that may have precipitated this.  He's notice no blood in his stool.  Rectal Pain History from Dec 2013: He had some trouble last year.  But it got better. Then about 2 weeks, he had a hard BM and develped rectal pain, particularly with bowel movements.  He saw [name], FNP, on 08/16/12,  who referred him to our office. He has GERD.  No other significant GI history.  He thinks that he had a colonoscopy, but cannot remember the year or the physician who did the test.  The patient has no history of stomach disease.  No history of liver disease or gall bladder disease.  No history of pancreas disease.  No history of colon disease.   Past Medical History  Diagnosis Date  . CHF (congestive heart failure)      History reviewed. No pertinent past surgical history.    Current Outpatient Prescriptions  Medication Sig Dispense Refill  . aspirin 81 MG tablet Take 81 mg by mouth daily.      Marland Kitchen BYSTOLIC 10 MG tablet       . furosemide (LASIX) 40 MG tablet       . omeprazole (PRILOSEC) 20 MG capsule        No current facility-administered medications for this visit.     No Known Allergies  REVIEW OF SYSTEMS: Skin:  Excess skin from weight loss Infection:  No history of hepatitis or HIV.  No history of MRSA. Neurologic:  No history of stroke.  No history of seizure.  No history of headaches. Cardiac:  History of hypertensioin.  Had CHF in 2011, but since weight loss, has done much better. Pulmonary:  Does not smoke cigarettes.  No asthma or bronchitis.  No OSA/CPAP.  Endocrine:  No diabetes. No thyroid disease. Gastrointestinal:  See HPI.  GERD. Urologic:  No history of kidney stones.  No history of bladder infections. Musculoskeletal:  DJD in both knees that he said led to his disability. Hematologic:  No bleeding disorder.  No history of anemia.  Not anticoagulated. Psycho-social:  The patient is oriented.   The patient has no obvious psychologic or social impairment to understanding our conversation and plan.  SOCIAL and FAMILY HISTORY: Unmarried.  No children. On disability since 2011.  He worked in a [employer] before disability.  PHYSICAL EXAM: BP 126/74  Pulse 72  Temp(Src) 98.2 F (36.8 C) (Temporal)  Resp 15  Ht 5\' 8"  (1.727 m)  Wt 242 lb 6.4 oz (109.952 kg)   BMI 36.87 kg/m2  General: WN AA M who is alert and generally healthy appearing.  HEENT: Normal. Pupils equal. Neck: Supple. No mass.  No thyroid mass. Lymph Nodes:  No supraclavicular or cervical nodes. Lungs: Clear to auscultation and symmetric breath sounds. Heart:  RRR. No murmur or rub. Abdomen: Soft.No hernia. Normal bowel sounds.  No abdominal scars.  A lot of redundant skin, consistent with weight loss history. Rectal: Posterior anal fissure with sentinel tag.  He has only moderate anal spasm. He has a little nodularity around the fissure.  He has no hemorrhoid disease. Extremities:  Walks a little gimpy because of his knees.  DATA REVIEWED: No new notes.  [name], MD,  [facility] [address]  [address]   [place], [place]    [address] Phone:  [phone] FAX:  [phone]

## 2013-03-07 ENCOUNTER — Telehealth (INDEPENDENT_AMBULATORY_CARE_PROVIDER_SITE_OTHER): Payer: Self-pay

## 2013-03-07 ENCOUNTER — Telehealth (INDEPENDENT_AMBULATORY_CARE_PROVIDER_SITE_OTHER): Payer: Self-pay | Admitting: General Surgery

## 2013-03-07 NOTE — Telephone Encounter (Signed)
Per Dr. Allene Pyo office visit note patient is to start using Diltiazem 2 % cream ; [facility] notified  Of authorization  refill

## 2013-03-07 NOTE — Telephone Encounter (Signed)
Received request for diltiazem HCL 2% cream ; I did not see  Documentation  of her seeing one of our physicians [facility] notified [phone]

## 2013-03-07 NOTE — Telephone Encounter (Signed)
Patient calling stating he contacted [facility] who told him they faxed [place] a refill request on Friday (03/04/2013) and Today (03/07/2013) to ask for a refill of diltiazem cream. Patient has not heard anything and is asking for a refill. Please advise. [phone].

## 2013-03-24 ENCOUNTER — Encounter (INDEPENDENT_AMBULATORY_CARE_PROVIDER_SITE_OTHER): Payer: Self-pay | Admitting: Surgery

## 2013-03-24 ENCOUNTER — Ambulatory Visit (INDEPENDENT_AMBULATORY_CARE_PROVIDER_SITE_OTHER): Payer: Medicare Other | Admitting: Surgery

## 2013-03-24 VITALS — BP 158/90 | HR 60 | Resp 16 | Ht 68.0 in | Wt 249.6 lb

## 2013-03-24 DIAGNOSIS — K602 Anal fissure, unspecified: Secondary | ICD-10-CM

## 2013-03-24 NOTE — Progress Notes (Signed)
 Re:   [name] DOB:   09/19/1960 MRN:   [id number]  Urgent Office  ASSESSMENT AND PLAN: 1.  Rectal pain - posterior anal fissure, recurrent.  Persistant pain secondary to anal fissure.  Discussed lateral internal sphincterotomy as surgical treatment for the fissure.  Discussed indications and potential complications of the surgery.  The potential complications include, but not limited to, infection, bleeding, incontinence of wind/stool, recurrent pain, and recurrent fissure.  Will obtain Dr. Avva's most recent office notes (he sees him about q 3 months)  I also discussed a colonoscopy at the same time.  Discussed risks of colonoscopy, which include, but are not limited to perforation and bleeding.  2.  HTN 3.  DJD of both knees  This led to disability in 2011 from work. 4.  Conjestive heart failure/respiratory failure March 2011 5.  Significant weight loss (>200 pounds) over last 2 years 6.  GERD 7.  On disability.  No chief complaint on file.  REFERRING PHYSICIAN: AVVA,RAVISANKAR R, MD  HISTORY OF PRESENT ILLNESS: [name] is a 52 y.o. (DOB: 12/07/1960)  AA male whose primary care physician is AVVA,RAVISANKAR R, MD and comes to me today for persistent rectal pain.  The patient has similar symptoms when I saw him on 02/22/2013. He is tropic Anusol HC suppositories, but has not had improvement in symptoms. He tried Diltiazem cream without improvement in symptoms. He does do some sitz baths which she says "cools off" his rectal pain.  One thing peculiar is that his rectal pain often occurs a couple of hours after his bowel movement.  He says that his BM are regular, soft.  He uses Miralax almost daily. He is ready to consider surgery.  He is also unsure of his last colonoscopy and we discussed doing this at the same time as the sphincterotomy for his fissure.  History of anal fissure: I saw [name] in Dec 2013.  At that time, I thought that he had a posterior anal  fissure.  He did some sitz baths, used suppositories, and his pain resolved in about one week.  He takes miralax for his bowels.  He has a BM about every other day.  He's had no recent constipation or diarrhea.   Then about one week ago, he had recurrent rectal pain.  This is similar to what he had before.  He thought it may be hemorrhoids.  He noticed no change in his bowel habits that may have precipitated this.  He's notice no blood in his stool.  Rectal Pain History from Dec 2013: He had some trouble last year.  But it got better. Then about 2 weeks, he had a hard BM and develped rectal pain, particularly with bowel movements.  He saw Justin Drinkard, FNP, on 08/16/12, who referred him to our office. He has GERD.  No other significant GI history.  He thinks that he had a colonoscopy, but cannot remember the year or the physician who did the test.  The patient has no history of stomach disease.  No history of liver disease or gall bladder disease.  No history of pancreas disease.  No history of colon disease.   Past Medical History  Diagnosis Date  . CHF (congestive heart failure)      No past surgical history on file.    Current Outpatient Prescriptions  Medication Sig Dispense Refill  . aspirin 81 MG tablet Take 81 mg by mouth daily.      . BYSTOLIC   10 MG tablet       . furosemide (LASIX) 40 MG tablet       . omeprazole (PRILOSEC) 20 MG capsule        No current facility-administered medications for this visit.     No Known Allergies  REVIEW OF SYSTEMS: Skin:  Excess skin from weight loss Infection:  No history of hepatitis or HIV.  No history of MRSA. Neurologic:  No history of stroke.  No history of seizure.  No history of headaches. Cardiac:  History of hypertensioin.  Had CHF in 2011, but since weight loss, has done much better. Pulmonary:  Does not smoke cigarettes.  No asthma or bronchitis.  No OSA/CPAP.  Endocrine:  No diabetes. No thyroid disease. Gastrointestinal:  See  HPI.  GERD. Urologic:  No history of kidney stones.  No history of bladder infections. Musculoskeletal:  DJD in both knees that he said led to his disability. Hematologic:  No bleeding disorder.  No history of anemia.  Not anticoagulated. Psycho-social:  The patient is oriented.   The patient has no obvious psychologic or social impairment to understanding our conversation and plan.  SOCIAL and FAMILY HISTORY: Unmarried.  No children. On disability since 2011.  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  PHYSICAL EXAM: BP 158/90  Pulse 60  Resp 16  Ht 5' 8" (1.727 m)  Wt 249 lb 9.6 oz (113.218 kg)  BMI 37.96 kg/m2  General: WN AA M who is alert and generally healthy appearing.  HEENT: Normal. Pupils equal. Neck: Supple. No mass.  No thyroid mass. Lymph Nodes:  No supraclavicular or cervical nodes. Lungs: Clear to auscultation and symmetric breath sounds. Heart:  RRR. No murmur or rub. Abdomen: Soft.No hernia. Normal bowel sounds.  No abdominal scars.  A lot of redundant skin, consistent with weight loss history. Rectal: Posterior anal fissure with sentinel tag.  He has only moderate anal spasm. He has a little nodularity around the fissure.  He has no hemorrhoid disease. Extremities:  Walks a little gimpy because of his knees.  DATA REVIEWED: No new notes.  Deshonda Cryderman, MD,  [facility] [address]  [address]   [place],     [address] Phone:  [phone] FAX:  [phone]  

## 2013-04-06 NOTE — Progress Notes (Signed)
Need orders in [facility] for surgery 04/22/13.  Thank You.

## 2013-04-07 ENCOUNTER — Other Ambulatory Visit (INDEPENDENT_AMBULATORY_CARE_PROVIDER_SITE_OTHER): Payer: Self-pay | Admitting: Surgery

## 2013-04-11 ENCOUNTER — Encounter (HOSPITAL_COMMUNITY): Payer: Self-pay | Admitting: Pharmacy Technician

## 2013-04-14 ENCOUNTER — Other Ambulatory Visit (HOSPITAL_COMMUNITY): Payer: Self-pay | Admitting: Surgery

## 2013-04-14 NOTE — Patient Instructions (Addendum)
20 STRYKER VEASEY  04/14/2013   Your procedure is scheduled on: 04-22-2013  Report to [facility] at 530  AM.  Call this number if you have problems the morning of surgery [phone]   Remember:follow all bowel prep instructions from dr [name]   Do not eat food or drink liquids :After Midnight.     Take these medicines the morning of surgery with A SIP OF WATER: bystolic, omeprazole                                SEE [facility] PREPARING FOR SURGERY SHEET   Do not wear jewelry, make-up or nail polish.  Do not wear lotions, powders, or perfumes. You may wear deodorant.   Men may shave face and neck.  Do not bring valuables to the hospital. [facility] IS NOT RESPONSIBLE FOR VALUEABLES.  Contacts, dentures or bridgework may not be worn into surgery.  Leave suitcase in the car. After surgery it may be brought to your room.  For patients admitted to the hospital, checkout time is 11:00 AM the day of discharge.   Patients discharged the day of surgery will not be allowed to drive home.  Name and phone number of your driver:mother rowenia Mault home [phone]  Special Instructions: N/A   Please read over the following fact sheets that you were given: MRSA Information.  Call Cain Sieve RN pre op nurse if needed [phone]    FAILURE TO FOLLOW THESE INSTRUCTIONS MAY RESULT IN THE CANCELLATION OF YOUR SURGERY.  PATIENT SIGNATURE___________________________________________  NURSE SIGNATURE_____________________________________________

## 2013-04-15 ENCOUNTER — Encounter (HOSPITAL_COMMUNITY)
Admission: RE | Admit: 2013-04-15 | Discharge: 2013-04-15 | Disposition: A | Discharge status: Home / Self Care | Payer: Medicare Other | Source: Ambulatory Visit | Attending: Surgery | Admitting: Surgery

## 2013-04-15 ENCOUNTER — Ambulatory Visit (HOSPITAL_COMMUNITY)
Admission: RE | Admit: 2013-04-15 | Discharge: 2013-04-15 | Disposition: A | Discharge status: Home / Self Care | Payer: Medicare Other | Source: Ambulatory Visit | Attending: Surgery | Admitting: Surgery

## 2013-04-15 ENCOUNTER — Encounter (HOSPITAL_COMMUNITY): Payer: Self-pay

## 2013-04-15 DIAGNOSIS — I252 Old myocardial infarction: Secondary | ICD-10-CM | POA: Insufficient documentation

## 2013-04-15 DIAGNOSIS — Z01812 Encounter for preprocedural laboratory examination: Secondary | ICD-10-CM | POA: Insufficient documentation

## 2013-04-15 DIAGNOSIS — Z01818 Encounter for other preprocedural examination: Secondary | ICD-10-CM | POA: Insufficient documentation

## 2013-04-15 DIAGNOSIS — Z01810 Encounter for preprocedural cardiovascular examination: Secondary | ICD-10-CM | POA: Insufficient documentation

## 2013-04-15 DIAGNOSIS — R9431 Abnormal electrocardiogram [ECG] [EKG]: Secondary | ICD-10-CM | POA: Insufficient documentation

## 2013-04-15 HISTORY — DX: Essential (primary) hypertension: I10

## 2013-04-15 HISTORY — DX: Unspecified asthma, uncomplicated: J45.909

## 2013-04-15 LAB — BASIC METABOLIC PANEL
CO2: 30 mEq/L (ref 19–32)
Glucose, Bld: 73 mg/dL (ref 70–99)
Potassium: 4.5 mEq/L (ref 3.5–5.1)
Sodium: 138 mEq/L (ref 135–145)

## 2013-04-15 LAB — CBC
Hemoglobin: 14.2 g/dL (ref 13.0–17.0)
MCH: 30.1 pg (ref 26.0–34.0)
MCV: 89.4 fL (ref 78.0–100.0)
RBC: 4.71 MIL/uL (ref 4.22–5.81)

## 2013-04-15 MED ORDER — CHLORHEXIDINE GLUCONATE 4 % EX LIQD
1.0000 "application " | Freq: Once | CUTANEOUS | Status: DC
  Filled 2013-04-15: qty 15

## 2013-04-15 MED ORDER — CEFOXITIN SODIUM 2 G IV SOLR
2.0000 g | INTRAVENOUS | Status: DC
  Filled 2013-04-15: qty 2

## 2013-04-15 NOTE — Progress Notes (Signed)
04/15/13 1259  OBSTRUCTIVE SLEEP APNEA  Have you ever been diagnosed with sleep apnea through a sleep study? No  Do you snore loudly (loud enough to be heard through closed doors)?  1  Do you often feel tired, fatigued, or sleepy during the daytime? 0  Has anyone observed you stop breathing during your sleep? 0  Do you have, or are you being treated for high blood pressure? 1  BMI more than 35 kg/m2? 1  Age over 52 years old? 1  Neck circumference greater than 40 cm/18 inches? 0  Gender: 1  Obstructive Sleep Apnea Score 5  Score 4 or greater  Results sent to PCP

## 2013-04-22 ENCOUNTER — Encounter (HOSPITAL_COMMUNITY): Payer: Self-pay

## 2013-04-22 ENCOUNTER — Encounter (HOSPITAL_COMMUNITY): Payer: Self-pay | Admitting: Anesthesiology

## 2013-04-22 ENCOUNTER — Ambulatory Visit (HOSPITAL_COMMUNITY)
Admission: RE | Admit: 2013-04-22 | Discharge: 2013-04-22 | Disposition: A | Discharge status: Home / Self Care | Payer: Medicare Other | Source: Ambulatory Visit | Attending: Surgery | Admitting: Surgery

## 2013-04-22 ENCOUNTER — Encounter (HOSPITAL_COMMUNITY)
Admission: RE | Disposition: A | Discharge status: Home / Self Care | Payer: Self-pay | Source: Ambulatory Visit | Attending: Surgery

## 2013-04-22 ENCOUNTER — Ambulatory Visit (HOSPITAL_COMMUNITY): Payer: Medicare Other | Admitting: Anesthesiology

## 2013-04-22 DIAGNOSIS — Z7982 Long term (current) use of aspirin: Secondary | ICD-10-CM | POA: Insufficient documentation

## 2013-04-22 DIAGNOSIS — K219 Gastro-esophageal reflux disease without esophagitis: Secondary | ICD-10-CM | POA: Insufficient documentation

## 2013-04-22 DIAGNOSIS — K5730 Diverticulosis of large intestine without perforation or abscess without bleeding: Secondary | ICD-10-CM

## 2013-04-22 DIAGNOSIS — Z79899 Other long term (current) drug therapy: Secondary | ICD-10-CM | POA: Insufficient documentation

## 2013-04-22 DIAGNOSIS — M1710 Unilateral primary osteoarthritis, unspecified knee: Secondary | ICD-10-CM | POA: Insufficient documentation

## 2013-04-22 DIAGNOSIS — K602 Anal fissure, unspecified: Secondary | ICD-10-CM

## 2013-04-22 DIAGNOSIS — Z1211 Encounter for screening for malignant neoplasm of colon: Secondary | ICD-10-CM | POA: Insufficient documentation

## 2013-04-22 DIAGNOSIS — I10 Essential (primary) hypertension: Secondary | ICD-10-CM | POA: Insufficient documentation

## 2013-04-22 HISTORY — PX: SPHINCTEROTOMY: SHX5279

## 2013-04-22 HISTORY — PX: COLONOSCOPY: SHX5424

## 2013-04-22 SURGERY — COLONOSCOPY
Anesthesia: General | Wound class: Clean Contaminated

## 2013-04-22 MED ORDER — BUPIVACAINE HCL 0.25 % IJ SOLN
INTRAMUSCULAR | Status: DC | PRN
  Administered 2013-04-22: 9 mL

## 2013-04-22 MED ORDER — LIDOCAINE HCL (CARDIAC) 20 MG/ML IV SOLN
INTRAVENOUS | Status: DC | PRN
  Administered 2013-04-22: 50 mg via INTRAVENOUS

## 2013-04-22 MED ORDER — HYDRALAZINE HCL 20 MG/ML IJ SOLN
10.0000 mg | Freq: Once | INTRAMUSCULAR | Status: AC
  Administered 2013-04-22: 10 mg via INTRAVENOUS

## 2013-04-22 MED ORDER — MEPERIDINE HCL 50 MG/ML IJ SOLN: 6.2500 mg | INTRAMUSCULAR | Status: DC | PRN

## 2013-04-22 MED ORDER — HYDROMORPHONE HCL PF 1 MG/ML IJ SOLN
0.2500 mg | INTRAMUSCULAR | Status: DC | PRN
  Administered 2013-04-22 (×2): 0.5 mg via INTRAVENOUS

## 2013-04-22 MED ORDER — PHENYLEPHRINE HCL 10 MG/ML IJ SOLN
INTRAMUSCULAR | Status: DC | PRN
  Administered 2013-04-22: 40 ug via INTRAVENOUS

## 2013-04-22 MED ORDER — HYDRALAZINE HCL 20 MG/ML IJ SOLN
INTRAMUSCULAR | Status: AC
  Filled 2013-04-22: qty 1

## 2013-04-22 MED ORDER — OXYCODONE HCL 5 MG/5ML PO SOLN
5.0000 mg | Freq: Once | ORAL | Status: DC | PRN
  Filled 2013-04-22: qty 5

## 2013-04-22 MED ORDER — MIDAZOLAM HCL 5 MG/5ML IJ SOLN
INTRAMUSCULAR | Status: DC | PRN
  Administered 2013-04-22: 2 mg via INTRAVENOUS

## 2013-04-22 MED ORDER — HYDRALAZINE HCL 20 MG/ML IJ SOLN
5.0000 mg | Freq: Once | INTRAMUSCULAR | Status: AC
  Administered 2013-04-22: 5 mg via INTRAVENOUS

## 2013-04-22 MED ORDER — LACTATED RINGERS IV SOLN
INTRAVENOUS | Status: DC | PRN
  Administered 2013-04-22: 07:00:00 via INTRAVENOUS

## 2013-04-22 MED ORDER — OXYCODONE HCL 5 MG PO TABS: 5.0000 mg | ORAL_TABLET | Freq: Once | ORAL | Status: DC | PRN

## 2013-04-22 MED ORDER — HYDROCODONE-ACETAMINOPHEN 5-325 MG PO TABS: 1.0000 | ORAL_TABLET | Freq: Four times a day (QID) | ORAL | Status: DC | PRN

## 2013-04-22 MED ORDER — PROMETHAZINE HCL 25 MG/ML IJ SOLN: 6.2500 mg | INTRAMUSCULAR | Status: DC | PRN

## 2013-04-22 MED ORDER — PROPOFOL 10 MG/ML IV BOLUS
INTRAVENOUS | Status: DC | PRN
  Administered 2013-04-22: 200 mg via INTRAVENOUS
  Administered 2013-04-22: 50 mg via INTRAVENOUS

## 2013-04-22 MED ORDER — HYDROMORPHONE HCL PF 1 MG/ML IJ SOLN
INTRAMUSCULAR | Status: AC
  Filled 2013-04-22: qty 1

## 2013-04-22 MED ORDER — DEXTROSE 5 % IV SOLN
2.0000 g | Freq: Once | INTRAVENOUS | Status: AC
  Administered 2013-04-22: 2 g via INTRAVENOUS
  Filled 2013-04-22: qty 2

## 2013-04-22 MED ORDER — FENTANYL CITRATE 0.05 MG/ML IJ SOLN
INTRAMUSCULAR | Status: DC | PRN
  Administered 2013-04-22: 25 ug via INTRAVENOUS
  Administered 2013-04-22 (×2): 50 ug via INTRAVENOUS
  Administered 2013-04-22: 25 ug via INTRAVENOUS

## 2013-04-22 MED ORDER — NEBIVOLOL HCL 10 MG PO TABS
10.0000 mg | ORAL_TABLET | Freq: Every day | ORAL | Status: AC
  Administered 2013-04-22: 10 mg via ORAL
  Filled 2013-04-22 (×2): qty 1

## 2013-04-22 MED ORDER — 0.9 % SODIUM CHLORIDE (POUR BTL) OPTIME
TOPICAL | Status: DC | PRN
  Administered 2013-04-22: 1000 mL

## 2013-04-22 SURGICAL SUPPLY — 34 items
BLADE HEX COATED 2.75 (ELECTRODE) ×3 IMPLANT
BLADE SURG 15 STRL LF DISP TIS (BLADE) ×2 IMPLANT
BLADE SURG 15 STRL SS (BLADE) ×1
CLOTH BEACON ORANGE TIMEOUT ST (SAFETY) ×3 IMPLANT
COVER SURGICAL LIGHT HANDLE (MISCELLANEOUS) ×3 IMPLANT
DECANTER SPIKE VIAL GLASS SM (MISCELLANEOUS) ×3 IMPLANT
DRAIN PENROSE 18X1/2 LTX STRL (DRAIN) IMPLANT
DRAPE LG THREE QUARTER DISP (DRAPES) ×3 IMPLANT
DRSG PAD ABDOMINAL 8X10 ST (GAUZE/BANDAGES/DRESSINGS) ×3 IMPLANT
ELECT REM PT RETURN 9FT ADLT (ELECTROSURGICAL) ×3
ELECTRODE REM PT RTRN 9FT ADLT (ELECTROSURGICAL) ×2 IMPLANT
GAUZE SPONGE 4X4 16PLY XRAY LF (GAUZE/BANDAGES/DRESSINGS) ×3 IMPLANT
GLOVE BIOGEL PI IND STRL 7.0 (GLOVE) ×2 IMPLANT
GLOVE BIOGEL PI INDICATOR 7.0 (GLOVE) ×1
GLOVE SURG SIGNA 7.5 PF LTX (GLOVE) ×3 IMPLANT
GOWN STRL NON-REIN LRG LVL3 (GOWN DISPOSABLE) ×3 IMPLANT
GOWN STRL REIN XL XLG (GOWN DISPOSABLE) ×6 IMPLANT
KIT BASIN OR (CUSTOM PROCEDURE TRAY) ×3 IMPLANT
LUBRICANT JELLY K Y 4OZ (MISCELLANEOUS) ×3 IMPLANT
NDL SAFETY ECLIPSE 18X1.5 (NEEDLE) ×2 IMPLANT
NEEDLE HYPO 18GX1.5 SHARP (NEEDLE) ×1
NEEDLE HYPO 25X1 1.5 SAFETY (NEEDLE) ×3 IMPLANT
NS IRRIG 1000ML POUR BTL (IV SOLUTION) ×3 IMPLANT
PACK LITHOTOMY IV (CUSTOM PROCEDURE TRAY) ×3 IMPLANT
PENCIL BUTTON HOLSTER BLD 10FT (ELECTRODE) ×3 IMPLANT
SPONGE GAUZE 4X4 12PLY (GAUZE/BANDAGES/DRESSINGS) ×3 IMPLANT
SPONGE SURGIFOAM ABS GEL 12-7 (HEMOSTASIS) ×3 IMPLANT
SUT CHROMIC 2 0 SH (SUTURE) IMPLANT
SUT CHROMIC 3 0 SH 27 (SUTURE) IMPLANT
SYR CONTROL 10ML LL (SYRINGE) ×3 IMPLANT
TOWEL OR 17X26 10 PK STRL BLUE (TOWEL DISPOSABLE) ×3 IMPLANT
UNDERPAD 30X30 INCONTINENT (UNDERPADS AND DIAPERS) ×3 IMPLANT
WATER STERILE IRR 1500ML POUR (IV SOLUTION) ×3 IMPLANT
YANKAUER SUCT BULB TIP 10FT TU (MISCELLANEOUS) ×3 IMPLANT

## 2013-04-22 NOTE — Op Note (Signed)
04/22/2013  8:41 AM  PATIENT:  [name], 52 y.o., male, MRN: [id number]  PREOP DIAGNOSIS:  Posterior anal fissure  POSTOP DIAGNOSIS:   Posterior anal fissure, normal colon  PROCEDURE:   Procedure(s): COLONOSCOPY,  Exam under anesthesia, left lateral internal SPHINCTEROTOMY  SURGEON:   [name], M.D.  ASSISTANT:   None  ANESTHESIA:   general  Anesthesiologist: Gaylan Gerold, MD CRNA: Valeda Malm, CRNA; Clydene Pugh [place], CRNA  General  EBL:  minimal  ml  BLOOD ADMINISTERED: none  LOCAL MEDICATIONS USED:   8 cc 1/4% marcaine  SPECIMEN:   None  COUNTS CORRECT:  YES  INDICATIONS FOR PROCEDURE:  [name] is a 52 y.o. 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  It is unclear when the patient last had a colonoscopy, so I will do this at the same time.   The indications and risks of the surgery were explained to the patient.  The risks include, but are not limited to, infection, bleeding, bowel perforation, and nerve injury.  With the sphincterotomy, I discussed the possibility of incontinence post procedure.  OPERATIVE NOTE:  The patient was taken to room # 1 in the [facility].     The patient was under general anesthesia.  He was given 2 gm of cefotetan at the beginning of the operation.   A time out was held and the checklist reviewed.   A digital rectal exam was done at the beginning of the procedure..  The patient has a lot of redundant skin from his significant weight loss, which made examining him, even under anesthesia difficult.     The flexible Pentax colonoscope was passed up the rectum without difficulty.  The scope was advanced to the cecum and the ileocecal valve was identified.  The colonic prep was good.   The right colon, transverse colon, left colon, and sigmoid colon were unremarkable.  He had a severe turn at the hepatic flexure, but I was able  to get into the cecum and visualize the ileo-cecal valve.  He did have a rare sigmoid diverticula.   The scope was withdrawn into the rectum and retroflexed.  The rectum was unremarkable.  The patient's next colonoscopy should be in 10 years.   I then prepped his rectum with betadine and sterilely draped his anus/rectum.  I did an exam with an anoscope and saw a posterior anal fissure.  There was no other mass or nodule identified in the rectal canal.  I then did a left lateral internal sphincterotomy and dilated his rectum to three fingers.  Because of the excess skin and his size, it was difficult to assess the adequacy of the sphincterotomy.  I infiltrated 8 cc of 1/4% marcaine in the incision tract.  Sponge and needle count were correct at the end of the case.   The patient tolerated the procedure well.  He was sent to the recovery room in good condition and will be discharged home today.  [name], MD, [facility] Pager: [phone] Office phone:  [phone]

## 2013-04-22 NOTE — H&P (View-Only) (Signed)
Re:   [name] DOB:   May 17, 1961 MRN:   [id number]  Urgent Office  ASSESSMENT AND PLAN: 1.  Rectal pain - posterior anal fissure, recurrent.  Persistant pain secondary to anal fissure.  Discussed lateral internal sphincterotomy as surgical treatment for the fissure.  Discussed indications and potential complications of the surgery.  The potential complications include, but not limited to, infection, bleeding, incontinence of wind/stool, recurrent pain, and recurrent fissure.  Will obtain Dr. Vicente Males most recent office notes (he sees him about q 3 months)  I also discussed a colonoscopy at the same time.  Discussed risks of colonoscopy, which include, but are not limited to perforation and bleeding.  2.  HTN 3.  DJD of both knees  This led to disability in 2011 from work. 4.  Conjestive heart failure/respiratory failure March 2011 5.  Significant weight loss (>200 pounds) over last 2 years 6.  GERD 7.  On disability.  No chief complaint on file.  REFERRING PHYSICIAN: Hoyle Sauer, MD  HISTORY OF PRESENT ILLNESS: [name] is a 52 y.o. (DOB: Mar 27, 1961)  AA male whose primary care physician is Hoyle Sauer, MD and comes to me today for persistent rectal pain.  The patient has similar symptoms when I saw him on 02/22/2013. He is tropic Anusol HC suppositories, but has not had improvement in symptoms. He tried Diltiazem cream without improvement in symptoms. He does do some sitz baths which she says "cools off" his rectal pain.  One thing peculiar is that his rectal pain often occurs a couple of hours after his bowel movement.  He says that his BM are regular, soft.  He uses Miralax almost daily. He is ready to consider surgery.  He is also unsure of his last colonoscopy and we discussed doing this at the same time as the sphincterotomy for his fissure.  History of anal fissure: I saw Mr. Ericson in Dec 2013.  At that time, I thought that he had a posterior anal  fissure.  He did some sitz baths, used suppositories, and his pain resolved in about one week.  He takes miralax for his bowels.  He has a BM about every other day.  He's had no recent constipation or diarrhea.   Then about one week ago, he had recurrent rectal pain.  This is similar to what he had before.  He thought it may be hemorrhoids.  He noticed no change in his bowel habits that may have precipitated this.  He's notice no blood in his stool.  Rectal Pain History from Dec 2013: He had some trouble last year.  But it got better. Then about 2 weeks, he had a hard BM and develped rectal pain, particularly with bowel movements.  He saw [name], FNP, on 08/16/12, who referred him to our office. He has GERD.  No other significant GI history.  He thinks that he had a colonoscopy, but cannot remember the year or the physician who did the test.  The patient has no history of stomach disease.  No history of liver disease or gall bladder disease.  No history of pancreas disease.  No history of colon disease.   Past Medical History  Diagnosis Date  . CHF (congestive heart failure)      No past surgical history on file.    Current Outpatient Prescriptions  Medication Sig Dispense Refill  . aspirin 81 MG tablet Take 81 mg by mouth daily.      Marland Kitchen BYSTOLIC  10 MG tablet       . furosemide (LASIX) 40 MG tablet       . omeprazole (PRILOSEC) 20 MG capsule        No current facility-administered medications for this visit.     No Known Allergies  REVIEW OF SYSTEMS: Skin:  Excess skin from weight loss Infection:  No history of hepatitis or HIV.  No history of MRSA. Neurologic:  No history of stroke.  No history of seizure.  No history of headaches. Cardiac:  History of hypertensioin.  Had CHF in 2011, but since weight loss, has done much better. Pulmonary:  Does not smoke cigarettes.  No asthma or bronchitis.  No OSA/CPAP.  Endocrine:  No diabetes. No thyroid disease. Gastrointestinal:  See  HPI.  GERD. Urologic:  No history of kidney stones.  No history of bladder infections. Musculoskeletal:  DJD in both knees that he said led to his disability. Hematologic:  No bleeding disorder.  No history of anemia.  Not anticoagulated. Psycho-social:  The patient is oriented.   The patient has no obvious psychologic or social impairment to understanding our conversation and plan.  SOCIAL and FAMILY HISTORY: Unmarried.  No children. On disability since 2011.  He worked in a [employer] before disability.  PHYSICAL EXAM: BP 158/90  Pulse 60  Resp 16  Ht 5\' 8"  (1.727 m)  Wt 249 lb 9.6 oz (113.218 kg)  BMI 37.96 kg/m2  General: WN AA M who is alert and generally healthy appearing.  HEENT: Normal. Pupils equal. Neck: Supple. No mass.  No thyroid mass. Lymph Nodes:  No supraclavicular or cervical nodes. Lungs: Clear to auscultation and symmetric breath sounds. Heart:  RRR. No murmur or rub. Abdomen: Soft.No hernia. Normal bowel sounds.  No abdominal scars.  A lot of redundant skin, consistent with weight loss history. Rectal: Posterior anal fissure with sentinel tag.  He has only moderate anal spasm. He has a little nodularity around the fissure.  He has no hemorrhoid disease. Extremities:  Walks a little gimpy because of his knees.  DATA REVIEWED: No new notes.  [name], MD,  [facility] [address] [place]  [address]   [place], [place]    [address] Phone:  [phone] FAX:  [phone]

## 2013-04-22 NOTE — Progress Notes (Signed)
Apresoline 5 mg IVP given as ordered per Dr. Renold Don- made aware of patient's blood pressures and heart rates.

## 2013-04-22 NOTE — Progress Notes (Signed)
Apresoline 10 mg IVP given as ordered per Dr. Renold Don- made aware of patient's blood pressures

## 2013-04-22 NOTE — Transfer of Care (Signed)
Immediate Anesthesia Transfer of Care Note  Patient: [name]  Procedure(s) Performed: Procedure(s): COLONOSCOPY (N/A) Exam under anesthesia, left lateral internal SPHINCTEROTOMY (Left)  Patient Location: PACU  Anesthesia Type:General  Level of Consciousness: awake, alert  and patient cooperative  Airway & Oxygen Therapy: Patient Spontanous Breathing and Patient connected to face mask oxygen  Post-op Assessment: Report given to PACU RN and Post -op Vital signs reviewed and stable  Post vital signs: stable  Complications: No apparent anesthesia complications

## 2013-04-22 NOTE — Anesthesia Preprocedure Evaluation (Addendum)
Anesthesia Evaluation  Patient identified by MRN, date of birth, ID band Patient awake    Reviewed: Allergy & Precautions, H&P , NPO status , Patient's Chart, lab work & pertinent test results, reviewed documented beta blocker date and time   Airway Mallampati: II TM Distance: >3 FB Neck ROM: Full    Dental  (+) Dental Advisory Given and Teeth Intact   Pulmonary asthma ,  breath sounds clear to auscultation        Cardiovascular hypertension, Pt. on medications and Pt. on home beta blockers +CHF Rhythm:Regular Rate:Normal     Neuro/Psych negative neurological ROS  negative psych ROS   GI/Hepatic negative GI ROS, Neg liver ROS, GERD-  Medicated,  Endo/Other  negative endocrine ROS  Renal/GU negative Renal ROS     Musculoskeletal negative musculoskeletal ROS (+)   Abdominal (+) + obese,   Peds  Hematology negative hematology ROS (+)   Anesthesia Other Findings   Reproductive/Obstetrics                          Anesthesia Physical Anesthesia Plan  ASA: III  Anesthesia Plan: General   Post-op Pain Management:    Induction: Intravenous  Airway Management Planned: Oral ETT and LMA  Additional Equipment:   Intra-op Plan:   Post-operative Plan: Extubation in OR  Informed Consent: I have reviewed the patients History and Physical, chart, labs and discussed the procedure including the risks, benefits and alternatives for the proposed anesthesia with the patient or authorized representative who has indicated his/her understanding and acceptance.   Dental advisory given  Plan Discussed with: CRNA  Anesthesia Plan Comments:         Anesthesia Quick Evaluation

## 2013-04-22 NOTE — Interval H&P Note (Signed)
History and Physical Interval Note:  04/22/2013 7:27 AM  [name]  has presented today for surgery, with the diagnosis of anal fissure  The various methods of treatment have been discussed with the patient and family. The patient's mother is with him today.   After consideration of risks, benefits and other options for treatment, the patient has consented to  Procedure(s): COLONOSCOPY (N/A) Exam under anesthesia, left lateral internal SPHINCTEROTOMY (N/A) as a surgical intervention .    The patient's history has been reviewed, patient examined, no change in status, stable for surgery.  I have reviewed the patient's chart and labs.  Questions were answered to the patient's satisfaction.     Kerrianne Jeng H

## 2013-04-22 NOTE — Progress Notes (Signed)
Apresoline 5 mg IVP repeated per Dr. Renold Don- made aware of patient's blood pressures

## 2013-04-22 NOTE — Progress Notes (Signed)
Blood pressure 176/84

## 2013-04-22 NOTE — Anesthesia Postprocedure Evaluation (Signed)
Anesthesia Post Note  Patient: [name]  Procedure(s) Performed: Procedure(s) (LRB): COLONOSCOPY (N/A) Exam under anesthesia, left lateral internal SPHINCTEROTOMY (Left)  Anesthesia type: General  Patient location: PACU  Post pain: Pain level controlled  Post assessment: Post-op Vital signs reviewed  Last Vitals: BP 165/91  Pulse 78  Temp(Src) 36.7 C (Oral)  Resp 18  SpO2 100%  Post vital signs: Reviewed  Level of consciousness: sedated  Complications: No apparent anesthesia complications

## 2013-04-25 ENCOUNTER — Encounter (HOSPITAL_COMMUNITY): Payer: Self-pay | Admitting: Surgery

## 2013-05-11 ENCOUNTER — Ambulatory Visit (INDEPENDENT_AMBULATORY_CARE_PROVIDER_SITE_OTHER): Payer: Medicare Other | Admitting: Surgery

## 2013-05-11 ENCOUNTER — Encounter (INDEPENDENT_AMBULATORY_CARE_PROVIDER_SITE_OTHER): Payer: Self-pay | Admitting: Surgery

## 2013-05-11 VITALS — BP 118/80 | HR 68 | Temp 97.6°F | Resp 15 | Ht 69.0 in | Wt 251.4 lb

## 2013-05-11 DIAGNOSIS — K602 Anal fissure, unspecified: Secondary | ICD-10-CM

## 2013-05-11 NOTE — Progress Notes (Signed)
Re:   [name] DOB:   02-20-1961 MRN:   [id number]  Urgent Office  ASSESSMENT AND PLAN: 1.  Rectal pain - posterior anal fissure, recurrent.  Left lateral internal sphincterotomy (with colonoscopy) - 04/22/2013  Better, though with some pain post bowel movement.  My hope is that this resolves over 4 to 6 weeks.  Return is PRN, unless he keeps having pain.  I did give him some Anusol HC suppositories (#28, with 2 refills) if he is still having pain.  2.  HTN 3.  DJD of both knees  This led to disability in 2011 from work. 4.  Conjestive heart failure/respiratory failure March 2011 5.  Significant weight loss (>200 pounds) over last 2 years 6.  GERD 7.  On disability.  Chief Complaint  Patient presents with  . Routine Post Op    p/o exam colonoscopy   REFERRING PHYSICIAN: Hoyle Sauer, MD  HISTORY OF PRESENT ILLNESS: [name] is a 52 y.o. (DOB: 1960/12/28)  AA male whose primary care physician is Justin Sauer, MD and comes to me today for follow up of a left lateral internal sphincterotomy.  His pain is better, but not totally resolved.  He is having normal bowel movements. He is eating okay.   He still has pain at the end of his bowel movements.  No blood.  History of anal fissure: I saw [name] in Dec 2013.  At that time, I thought that he had a posterior anal fissure.  He did some sitz baths, used suppositories, and his pain resolved in about one week.  He takes miralax for his bowels.  He has a BM about every other day.  He's had no recent constipation or diarrhea.   Then about one week ago, he had recurrent rectal pain.  This is similar to what he had before.  He thought it may be hemorrhoids.  He noticed no change in his bowel habits that may have precipitated this.  He's notice no blood in his stool.  Rectal Pain History from Dec 2013: He had some trouble last year.  But it got better. Then about 2 weeks, he had a hard BM and develped rectal pain,  particularly with bowel movements.  He saw Justin Modena, FNP, on 08/16/12, who referred him to our office. He has GERD.  No other significant GI history.  He thinks that he had a colonoscopy, but cannot remember the year or the physician who did the test.  The patient has no history of stomach disease.  No history of liver disease or gall bladder disease.  No history of pancreas disease.  No history of colon disease.   Past Medical History  Diagnosis Date  . CHF (congestive heart failure)   . Hypertension   . Asthma     mild, no inhaler used     Current Outpatient Prescriptions  Medication Sig Dispense Refill  . aspirin 81 MG tablet Take 81 mg by mouth daily.      Marland Kitchen BYSTOLIC 10 MG tablet Take 10 mg by mouth every morning.       . furosemide (LASIX) 40 MG tablet Take 40 mg by mouth every morning.       Marland Kitchen HYDROcodone-acetaminophen (NORCO/VICODIN) 5-325 MG per tablet Take 1-2 tablets by mouth every 6 (six) hours as needed for pain.  30 tablet  1  . omeprazole (PRILOSEC) 20 MG capsule Take 20 mg by mouth daily.       Marland Kitchen  potassium chloride SA (K-DUR,KLOR-CON) 20 MEQ tablet Take 20 mEq by mouth daily.       No current facility-administered medications for this visit.      Allergies  Allergen Reactions  . Tomato Rash    REVIEW OF SYSTEMS: Skin:  Excess skin from weight loss Cardiac:  History of hypertensioin.  Had CHF in 2011, but since weight loss, has done much better. Gastrointestinal:  See HPI.  GERD. Colonoscopy by Dr. [name] - 04/22/2013 Musculoskeletal:  DJD in both knees that he said led to his disability.  SOCIAL and FAMILY HISTORY: Unmarried.  No children. On disability since 2011.  He worked in a [employer] before disability.  PHYSICAL EXAM: BP 118/80  Pulse 68  Temp(Src) 97.6 F (36.4 C) (Temporal)  Resp 15  Ht 5\' 9"  (1.753 m)  Wt 251 lb 6.4 oz (114.034 kg)  BMI 37.11 kg/m2  General: WN AA M who is alert and generally healthy appearing.  Rectal: Lax rectum.  Still  tender, but difficult to see fissure.  I think that he just needs some more time.  No blood in stool. Extremities:  Walks a little gimpy because of his knees.  DATA REVIEWED: No new notes.  Justin Kin, MD,  [facility] [address] [place]  [address]   [place], [place]    [address] Phone:  [phone] FAX:  [phone]

## 2013-07-05 ENCOUNTER — Telehealth (INDEPENDENT_AMBULATORY_CARE_PROVIDER_SITE_OTHER): Payer: Self-pay

## 2013-07-05 NOTE — Telephone Encounter (Signed)
Patient is asking for hydrocodone 5/325mg  refill for rectal pain. I informed him Dr. [name] would be in the in the am I would call him after speaking to Dr. [name]. Patient agreeable with plan

## 2013-07-06 ENCOUNTER — Telehealth (INDEPENDENT_AMBULATORY_CARE_PROVIDER_SITE_OTHER): Payer: Self-pay

## 2013-07-06 NOTE — Telephone Encounter (Signed)
Patient aware of RX vicodin 5/325mg  for P/U at front desk

## 2013-07-18 ENCOUNTER — Telehealth (INDEPENDENT_AMBULATORY_CARE_PROVIDER_SITE_OTHER): Payer: Self-pay

## 2013-07-18 NOTE — Telephone Encounter (Signed)
F/U call; Patient states he is doing , the Anusol supp.is helping . Pain is controlled at this time . I advised him to call if his symptoms got worse or had any concerns. Patient verbalized understanding. Cancelled appt 07-20-13 with  Dr. [name]

## 2013-07-20 ENCOUNTER — Encounter (INDEPENDENT_AMBULATORY_CARE_PROVIDER_SITE_OTHER): Payer: Medicare Other | Admitting: Surgery

## 2014-12-07 DIAGNOSIS — Z6841 Body Mass Index (BMI) 40.0 and over, adult: Secondary | ICD-10-CM | POA: Diagnosis not present

## 2014-12-07 DIAGNOSIS — K6289 Other specified diseases of anus and rectum: Secondary | ICD-10-CM | POA: Diagnosis not present

## 2014-12-07 DIAGNOSIS — Z1389 Encounter for screening for other disorder: Secondary | ICD-10-CM | POA: Diagnosis not present

## 2014-12-07 DIAGNOSIS — K602 Anal fissure, unspecified: Secondary | ICD-10-CM | POA: Diagnosis not present

## 2014-12-07 DIAGNOSIS — I10 Essential (primary) hypertension: Secondary | ICD-10-CM | POA: Diagnosis not present

## 2015-01-11 DIAGNOSIS — K602 Anal fissure, unspecified: Secondary | ICD-10-CM | POA: Diagnosis not present

## 2015-02-23 DIAGNOSIS — Z0000 Encounter for general adult medical examination without abnormal findings: Secondary | ICD-10-CM | POA: Diagnosis not present

## 2015-02-23 DIAGNOSIS — E785 Hyperlipidemia, unspecified: Secondary | ICD-10-CM | POA: Diagnosis not present

## 2015-02-23 DIAGNOSIS — Z125 Encounter for screening for malignant neoplasm of prostate: Secondary | ICD-10-CM | POA: Diagnosis not present

## 2015-02-23 DIAGNOSIS — I10 Essential (primary) hypertension: Secondary | ICD-10-CM | POA: Diagnosis not present

## 2015-03-06 DIAGNOSIS — K602 Anal fissure, unspecified: Secondary | ICD-10-CM | POA: Diagnosis not present

## 2015-03-06 DIAGNOSIS — Z0000 Encounter for general adult medical examination without abnormal findings: Secondary | ICD-10-CM | POA: Diagnosis not present

## 2015-03-06 DIAGNOSIS — E785 Hyperlipidemia, unspecified: Secondary | ICD-10-CM | POA: Diagnosis not present

## 2015-03-06 DIAGNOSIS — J302 Other seasonal allergic rhinitis: Secondary | ICD-10-CM | POA: Diagnosis not present

## 2015-03-06 DIAGNOSIS — G4733 Obstructive sleep apnea (adult) (pediatric): Secondary | ICD-10-CM | POA: Diagnosis not present

## 2015-03-06 DIAGNOSIS — I509 Heart failure, unspecified: Secondary | ICD-10-CM | POA: Diagnosis not present

## 2015-03-06 DIAGNOSIS — M1990 Unspecified osteoarthritis, unspecified site: Secondary | ICD-10-CM | POA: Diagnosis not present

## 2015-03-06 DIAGNOSIS — K219 Gastro-esophageal reflux disease without esophagitis: Secondary | ICD-10-CM | POA: Diagnosis not present

## 2015-03-08 DIAGNOSIS — Z1212 Encounter for screening for malignant neoplasm of rectum: Secondary | ICD-10-CM | POA: Diagnosis not present

## 2015-03-15 DIAGNOSIS — K601 Chronic anal fissure: Secondary | ICD-10-CM | POA: Diagnosis not present

## 2015-03-15 DIAGNOSIS — K602 Anal fissure, unspecified: Secondary | ICD-10-CM | POA: Diagnosis not present

## 2016-04-23 ENCOUNTER — Telehealth: Payer: Self-pay

## 2016-04-23 ENCOUNTER — Ambulatory Visit (INDEPENDENT_AMBULATORY_CARE_PROVIDER_SITE_OTHER): Payer: Commercial Managed Care - HMO | Admitting: Family Medicine

## 2016-04-23 ENCOUNTER — Encounter: Payer: Self-pay | Admitting: Family Medicine

## 2016-04-23 VITALS — BP 154/100 | HR 79 | Ht 67.5 in | Wt 292.2 lb

## 2016-04-23 DIAGNOSIS — Z9119 Patient's noncompliance with other medical treatment and regimen: Secondary | ICD-10-CM

## 2016-04-23 DIAGNOSIS — K219 Gastro-esophageal reflux disease without esophagitis: Secondary | ICD-10-CM | POA: Insufficient documentation

## 2016-04-23 DIAGNOSIS — E876 Hypokalemia: Secondary | ICD-10-CM | POA: Insufficient documentation

## 2016-04-23 DIAGNOSIS — Z91199 Patient's noncompliance with other medical treatment and regimen due to unspecified reason: Secondary | ICD-10-CM

## 2016-04-23 DIAGNOSIS — I10 Essential (primary) hypertension: Secondary | ICD-10-CM

## 2016-04-23 DIAGNOSIS — G4733 Obstructive sleep apnea (adult) (pediatric): Secondary | ICD-10-CM | POA: Insufficient documentation

## 2016-04-23 DIAGNOSIS — Z87448 Personal history of other diseases of urinary system: Secondary | ICD-10-CM | POA: Insufficient documentation

## 2016-04-23 DIAGNOSIS — I509 Heart failure, unspecified: Secondary | ICD-10-CM | POA: Insufficient documentation

## 2016-04-23 DIAGNOSIS — Z87438 Personal history of other diseases of male genital organs: Secondary | ICD-10-CM | POA: Insufficient documentation

## 2016-04-23 DIAGNOSIS — Z8701 Personal history of pneumonia (recurrent): Secondary | ICD-10-CM | POA: Insufficient documentation

## 2016-04-23 HISTORY — DX: Gastro-esophageal reflux disease without esophagitis: K21.9

## 2016-04-23 MED ORDER — OMEPRAZOLE 20 MG PO CPDR: 20.0000 mg | DELAYED_RELEASE_CAPSULE | Freq: Every day | ORAL | 0 refills | Status: DC

## 2016-04-23 MED ORDER — NEBIVOLOL HCL 10 MG PO TABS: 10.0000 mg | ORAL_TABLET | Freq: Every morning | ORAL | 0 refills | Status: DC

## 2016-04-23 MED ORDER — FUROSEMIDE 40 MG PO TABS: 40.0000 mg | ORAL_TABLET | Freq: Two times a day (BID) | ORAL | 0 refills | Status: DC

## 2016-04-23 MED ORDER — POTASSIUM CHLORIDE CRYS ER 20 MEQ PO TBCR: 20.0000 meq | EXTENDED_RELEASE_TABLET | Freq: Every day | ORAL | 0 refills | Status: DC

## 2016-04-23 NOTE — Patient Instructions (Addendum)
Try taking the omeprazole 30 minutes before breakfast and see if this helps with acid reflux. Also avoid eating 3 hours before laying down and eat small frequent meals instead of large meals. Avoid foods that make acid reflux worse.   Return in 1 week for nurse visit and have your blood pressure rechecked.  Follow up with me in 1 month.  Call us regarding your medications and which pharmacy you plan to use going forward.    DASH Eating Plan DASH stands for "Dietary Approaches to Stop Hypertension." The DASH eating plan is a healthy eating plan that has been shown to reduce high blood pressure (hypertension). Additional health benefits may include reducing the risk of type 2 diabetes mellitus, heart disease, and stroke. The DASH eating plan may also help with weight loss. WHAT DO I NEED TO KNOW ABOUT THE DASH EATING PLAN? For the DASH eating plan, you will follow these general guidelines:  Choose foods with a percent daily value for sodium of less than 5% (as listed on the food label).  Use salt-free seasonings or herbs instead of table salt or sea salt.  Check with your health care provider or pharmacist before using salt substitutes.  Eat lower-sodium products, often labeled as "lower sodium" or "no salt added."  Eat fresh foods.  Eat more vegetables, fruits, and low-fat dairy products.  Choose whole grains. Look for the word "whole" as the first word in the ingredient list.  Choose fish and skinless chicken or [place] more often than red meat. Limit fish, poultry, and meat to 6 oz (170 g) each day.  Limit sweets, desserts, sugars, and sugary drinks.  Choose heart-healthy fats.  Limit cheese to 1 oz (28 g) per day.  Eat more home-cooked food and less restaurant, buffet, and fast food.  Limit fried foods.  Cook foods using methods other than frying.  Limit canned vegetables. If you do use them, rinse them well to decrease the sodium.  When eating at a restaurant, ask that  your food be prepared with less salt, or no salt if possible. WHAT FOODS CAN I EAT? Seek help from a dietitian for individual calorie needs. Grains Whole grain or whole wheat bread. Brown rice. Whole grain or whole wheat pasta. Quinoa, bulgur, and whole grain cereals. Low-sodium cereals. Corn or whole wheat flour tortillas. Whole grain cornbread. Whole grain crackers. Low-sodium crackers. Vegetables Fresh or frozen vegetables (raw, steamed, roasted, or grilled). Low-sodium or reduced-sodium tomato and vegetable juices. Low-sodium or reduced-sodium tomato sauce and paste. Low-sodium or reduced-sodium canned vegetables.  Fruits All fresh, canned (in natural juice), or frozen fruits. Meat and Other Protein Products Ground beef (85% or leaner), grass-fed beef, or beef trimmed of fat. Skinless chicken or [place]. Ground chicken or [place]. Pork trimmed of fat. All fish and seafood. Eggs. Dried beans, peas, or lentils. Unsalted nuts and seeds. Unsalted canned beans. Dairy Low-fat dairy products, such as skim or 1% milk, 2% or reduced-fat cheeses, low-fat ricotta or cottage cheese, or plain low-fat yogurt. Low-sodium or reduced-sodium cheeses. Fats and Oils Tub margarines without trans fats. Light or reduced-fat mayonnaise and salad dressings (reduced sodium). Avocado. Safflower, olive, or canola oils. Natural peanut or almond butter. Other Unsalted popcorn and pretzels. The items listed above may not be a complete list of recommended foods or beverages. Contact your dietitian for more options. WHAT FOODS ARE NOT RECOMMENDED? Grains White bread. White pasta. White rice. Refined cornbread. Bagels and croissants. Crackers that contain trans fat. Vegetables Creamed or  fried vegetables. Vegetables in a cheese sauce. Regular canned vegetables. Regular canned tomato sauce and paste. Regular tomato and vegetable juices. Fruits Dried fruits. Canned fruit in light or heavy syrup. Fruit juice. Meat and Other  Protein Products Fatty cuts of meat. Ribs, chicken wings, bacon, sausage, bologna, salami, chitterlings, fatback, hot dogs, bratwurst, and packaged luncheon meats. Salted nuts and seeds. Canned beans with salt. Dairy Whole or 2% milk, cream, half-and-half, and cream cheese. Whole-fat or sweetened yogurt. Full-fat cheeses or blue cheese. Nondairy creamers and whipped toppings. Processed cheese, cheese spreads, or cheese curds. Condiments Onion and garlic salt, seasoned salt, table salt, and sea salt. Canned and packaged gravies. Worcestershire sauce. Tartar sauce. Barbecue sauce. Teriyaki sauce. Soy sauce, including reduced sodium. Steak sauce. Fish sauce. Oyster sauce. Cocktail sauce. Horseradish. Ketchup and mustard. Meat flavorings and tenderizers. Bouillon cubes. Hot sauce. Tabasco sauce. Marinades. Taco seasonings. Relishes. Fats and Oils Butter, stick margarine, lard, shortening, ghee, and bacon fat. Coconut, palm kernel, or palm oils. Regular salad dressings. Other Pickles and olives. Salted popcorn and pretzels. The items listed above may not be a complete list of foods and beverages to avoid. Contact your dietitian for more information. WHERE CAN I FIND MORE INFORMATION? National Heart, Lung, and Blood Institute: [employer]   This information is not intended to replace advice given to you by your health care provider. Make sure you discuss any questions you have with your health care provider.   Document Released: 08/14/2011 Document Revised: 09/15/2014 Document Reviewed: 06/29/2013 Elsevier Interactive Patient Education [employer]

## 2016-04-23 NOTE — Telephone Encounter (Signed)
Placed in your folder for review.  Note rcvd that pt has been dismissed from their office as well. [name]
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
## 2016-04-24 LAB — COMPLETE METABOLIC PANEL WITH GFR
ALBUMIN: 4 g/dL (ref 3.6–5.1)
ALK PHOS: 40 U/L (ref 40–115)
ALT: 16 U/L (ref 9–46)
AST: 22 U/L (ref 10–35)
BUN: 14 mg/dL (ref 7–25)
CHLORIDE: 103 mmol/L (ref 98–110)
CO2: 26 mmol/L (ref 20–31)
Calcium: 9.2 mg/dL (ref 8.6–10.3)
Creat: 1.09 mg/dL (ref 0.70–1.33)
GFR, EST NON AFRICAN AMERICAN: 76 mL/min (ref >=60)
GFR, Est African American: 88 mL/min (ref >=60)
GLUCOSE: 78 mg/dL (ref 65–99)
POTASSIUM: 4.5 mmol/L (ref 3.5–5.3)
SODIUM: 138 mmol/L (ref 135–146)
Total Bilirubin: 0.4 mg/dL (ref 0.2–1.2)
Total Protein: 6.5 g/dL (ref 6.1–8.1)

## 2016-04-24 LAB — CBC WITH DIFFERENTIAL/PLATELET
BASOS PCT: 1 %
Basophils Absolute: 62 cells/uL (ref 0–200)
EOS ABS: 868 {cells}/uL — AB (ref 15–500)
Eosinophils Relative: 14 %
HCT: 42.7 % (ref 38.5–50.0)
HEMOGLOBIN: 14.1 g/dL (ref 13.2–17.1)
Lymphocytes Relative: 17 %
Lymphs Abs: 1054 cells/uL (ref 850–3900)
MCH: 30.3 pg (ref 27.0–33.0)
MCHC: 33 g/dL (ref 32.0–36.0)
MCV: 91.6 fL (ref 80.0–100.0)
MONO ABS: 496 {cells}/uL (ref 200–950)
MPV: 10.5 fL (ref 7.5–12.5)
Monocytes Relative: 8 %
NEUTROS ABS: 3720 {cells}/uL (ref 1500–7800)
Neutrophils Relative %: 60 %
Platelets: 265 10*3/uL (ref 140–400)
RBC: 4.66 MIL/uL (ref 4.20–5.80)
RDW: 15.7 % — AB (ref 11.0–15.0)
WBC: 6.2 10*3/uL (ref 4.0–10.5)

## 2016-04-24 LAB — TSH: TSH: 1.33 m[IU]/L (ref 0.40–4.50)

## 2016-04-28 ENCOUNTER — Encounter: Payer: Self-pay | Admitting: Family Medicine

## 2016-04-29 ENCOUNTER — Encounter: Payer: Self-pay | Admitting: Family Medicine

## 2016-05-01 ENCOUNTER — Other Ambulatory Visit: Payer: Commercial Managed Care - HMO

## 2016-05-20 ENCOUNTER — Ambulatory Visit (INDEPENDENT_AMBULATORY_CARE_PROVIDER_SITE_OTHER): Payer: Commercial Managed Care - HMO | Admitting: Family Medicine

## 2016-05-20 ENCOUNTER — Encounter: Payer: Self-pay | Admitting: Family Medicine

## 2016-05-20 VITALS — BP 140/88 | HR 72 | Wt 292.6 lb

## 2016-05-20 DIAGNOSIS — I10 Essential (primary) hypertension: Secondary | ICD-10-CM | POA: Diagnosis not present

## 2016-05-20 DIAGNOSIS — E876 Hypokalemia: Secondary | ICD-10-CM | POA: Diagnosis not present

## 2016-05-20 MED ORDER — FUROSEMIDE 40 MG PO TABS: 40.0000 mg | ORAL_TABLET | Freq: Two times a day (BID) | ORAL | 5 refills | Status: DC

## 2016-05-20 MED ORDER — NEBIVOLOL HCL 10 MG PO TABS: 10.0000 mg | ORAL_TABLET | Freq: Every morning | ORAL | 5 refills | Status: DC

## 2016-05-20 MED ORDER — POTASSIUM CHLORIDE CRYS ER 20 MEQ PO TBCR: 20.0000 meq | EXTENDED_RELEASE_TABLET | Freq: Every day | ORAL | 5 refills | Status: DC

## 2016-05-20 NOTE — Progress Notes (Signed)
   Subjective:    Patient ID: [name], male    DOB: 1961-08-31, 55 y.o.   MRN: [id number]  HPI Chief Complaint  Patient presents with  . Hypertension    follow up   He is here for follow up on HTN. States he is taking his daily blood pressure medication.  He states he is trying to limit salt,  bread and sugar, and sodas. Paying closer attention to diet.   States he would like to have a medication to control appetite.  Diet recall: he appears to be eating carbohydrates and not as much protein.  States he does not exercise and does not have time to.   Denies fever, chills, fatigue, chest pain, shortness of breath.   Patient answered his cell phone and started having a telephone conversation during my interview with him. I stepped out of the room at that time.     Past Medical History:  Diagnosis Date  . Asthma    mild, no inhaler used  . CHF (congestive heart failure) (HCC)   . DJD (degenerative joint disease)    has taken tramadol 50mg  q8h in past  . GERD (gastroesophageal reflux disease)   . Hyperlipidemia   . Hypertension   . Morbid obesity (HCC)    weighed in excess of 500 lbs at one point  . OSA (obstructive sleep apnea)    did not tolerate CPAP, uses nasal cannula  . Proteinuria    GFR 94.12 February 2015     Review of Systems Pertinent positives and negatives in the history of present illness.     Objective:   Physical Exam BP 140/88   Pulse 72   Wt 292 lb 9.6 oz (132.7 kg)   BMI 45.15 kg/m   Alert and oriented and in no acute distress.      Assessment & Plan:  Essential hypertension  Morbid obesity, unspecified obesity type (HCC)  Discussed that his blood pressure has improved some but would still like to see it <140/80. Advised on diet and exercise.  Discussed that a weight loss pill without modifications to lifestyle would not be appropriate. Offered him a referral to medical nutritionist.  Patient answered a cell phone call during the visit and  while I was trying to counsel him.  Recommend that he keep an eye on his blood pressure and if he is not seeing readings in normal range then he should let me know .

## 2016-05-20 NOTE — Addendum Note (Signed)
Addended by: Avanell Shackleton on: 05/20/2016 02:23 PM   Modules accepted: Orders

## 2016-05-20 NOTE — Patient Instructions (Signed)
Keep an eye on your blood pressure and if you are not seeing readings <140/80 let me know. If you would like a referral to the medical nutritionist as we discussed then you can call and let us know.  Healthy diet and exercise is my recommendation for weight loss and managing your chronic health conditions.   DASH Eating Plan DASH stands for "Dietary Approaches to Stop Hypertension." The DASH eating plan is a healthy eating plan that has been shown to reduce high blood pressure (hypertension). Additional health benefits may include reducing the risk of type 2 diabetes mellitus, heart disease, and stroke. The DASH eating plan may also help with weight loss. WHAT DO I NEED TO KNOW ABOUT THE DASH EATING PLAN? For the DASH eating plan, you will follow these general guidelines:  Choose foods with a percent daily value for sodium of less than 5% (as listed on the food label).  Use salt-free seasonings or herbs instead of table salt or sea salt.  Check with your health care provider or pharmacist before using salt substitutes.  Eat lower-sodium products, often labeled as "lower sodium" or "no salt added."  Eat fresh foods.  Eat more vegetables, fruits, and low-fat dairy products.  Choose whole grains. Look for the word "whole" as the first word in the ingredient list.  Choose fish and skinless chicken or [place] more often than red meat. Limit fish, poultry, and meat to 6 oz (170 g) each day.  Limit sweets, desserts, sugars, and sugary drinks.  Choose heart-healthy fats.  Limit cheese to 1 oz (28 g) per day.  Eat more home-cooked food and less restaurant, buffet, and fast food.  Limit fried foods.  Cook foods using methods other than frying.  Limit canned vegetables. If you do use them, rinse them well to decrease the sodium.  When eating at a restaurant, ask that your food be prepared with less salt, or no salt if possible. WHAT FOODS CAN I EAT? Seek help from a dietitian for  individual calorie needs. Grains Whole grain or whole wheat bread. Brown rice. Whole grain or whole wheat pasta. Quinoa, bulgur, and whole grain cereals. Low-sodium cereals. Corn or whole wheat flour tortillas. Whole grain cornbread. Whole grain crackers. Low-sodium crackers. Vegetables Fresh or frozen vegetables (raw, steamed, roasted, or grilled). Low-sodium or reduced-sodium tomato and vegetable juices. Low-sodium or reduced-sodium tomato sauce and paste. Low-sodium or reduced-sodium canned vegetables.  Fruits All fresh, canned (in natural juice), or frozen fruits. Meat and Other Protein Products Ground beef (85% or leaner), grass-fed beef, or beef trimmed of fat. Skinless chicken or [place]. Ground chicken or [place]. Pork trimmed of fat. All fish and seafood. Eggs. Dried beans, peas, or lentils. Unsalted nuts and seeds. Unsalted canned beans. Dairy Low-fat dairy products, such as skim or 1% milk, 2% or reduced-fat cheeses, low-fat ricotta or cottage cheese, or plain low-fat yogurt. Low-sodium or reduced-sodium cheeses. Fats and Oils Tub margarines without trans fats. Light or reduced-fat mayonnaise and salad dressings (reduced sodium). Avocado. Safflower, olive, or canola oils. Natural peanut or almond butter. Other Unsalted popcorn and pretzels. The items listed above may not be a complete list of recommended foods or beverages. Contact your dietitian for more options. WHAT FOODS ARE NOT RECOMMENDED? Grains White bread. White pasta. White rice. Refined cornbread. Bagels and croissants. Crackers that contain trans fat. Vegetables Creamed or fried vegetables. Vegetables in a cheese sauce. Regular canned vegetables. Regular canned tomato sauce and paste. Regular tomato and vegetable juices. Fruits Dried  fruits. Canned fruit in light or heavy syrup. Fruit juice. Meat and Other Protein Products Fatty cuts of meat. Ribs, chicken wings, bacon, sausage, bologna, salami, chitterlings, fatback, hot  dogs, bratwurst, and packaged luncheon meats. Salted nuts and seeds. Canned beans with salt. Dairy Whole or 2% milk, cream, half-and-half, and cream cheese. Whole-fat or sweetened yogurt. Full-fat cheeses or blue cheese. Nondairy creamers and whipped toppings. Processed cheese, cheese spreads, or cheese curds. Condiments Onion and garlic salt, seasoned salt, table salt, and sea salt. Canned and packaged gravies. Worcestershire sauce. Tartar sauce. Barbecue sauce. Teriyaki sauce. Soy sauce, including reduced sodium. Steak sauce. Fish sauce. Oyster sauce. Cocktail sauce. Horseradish. Ketchup and mustard. Meat flavorings and tenderizers. Bouillon cubes. Hot sauce. Tabasco sauce. Marinades. Taco seasonings. Relishes. Fats and Oils Butter, stick margarine, lard, shortening, ghee, and bacon fat. Coconut, palm kernel, or palm oils. Regular salad dressings. Other Pickles and olives. Salted popcorn and pretzels. The items listed above may not be a complete list of foods and beverages to avoid. Contact your dietitian for more information. WHERE CAN I FIND MORE INFORMATION? National Heart, Lung, and Blood Institute: [employer]   This information is not intended to replace advice given to you by your health care provider. Make sure you discuss any questions you have with your health care provider.   Document Released: 08/14/2011 Document Revised: 09/15/2014 Document Reviewed: 06/29/2013 Elsevier Interactive Patient Education [employer]

## 2016-05-21 ENCOUNTER — Encounter: Payer: Self-pay | Admitting: Family Medicine

## 2016-06-22 ENCOUNTER — Other Ambulatory Visit: Payer: Self-pay | Admitting: Family Medicine

## 2016-06-22 DIAGNOSIS — E876 Hypokalemia: Secondary | ICD-10-CM

## 2016-07-03 ENCOUNTER — Ambulatory Visit (INDEPENDENT_AMBULATORY_CARE_PROVIDER_SITE_OTHER): Payer: Commercial Managed Care - HMO | Admitting: Family Medicine

## 2016-07-03 ENCOUNTER — Encounter: Payer: Self-pay | Admitting: Family Medicine

## 2016-07-03 VITALS — BP 140/82 | HR 74 | Temp 98.1°F | Wt 291.8 lb

## 2016-07-03 DIAGNOSIS — M542 Cervicalgia: Secondary | ICD-10-CM

## 2016-07-03 DIAGNOSIS — S80811A Abrasion, right lower leg, initial encounter: Secondary | ICD-10-CM

## 2016-07-03 DIAGNOSIS — M25561 Pain in right knee: Secondary | ICD-10-CM | POA: Diagnosis not present

## 2016-07-03 DIAGNOSIS — S1091XA Abrasion of unspecified part of neck, initial encounter: Secondary | ICD-10-CM | POA: Diagnosis not present

## 2016-07-03 MED ORDER — NAPROXEN SODIUM 220 MG PO TABS
220.0000 mg | ORAL_TABLET | Freq: Two times a day (BID) | ORAL | 0 refills | Status: DC
Start: 2016-07-03 — End: 2019-06-20
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
## 2016-07-03 NOTE — Patient Instructions (Signed)
Use Bacitracin to the abrasions for the next few days. Take 2 Aleve twice daily with food. You can also you heat or ice to your low back, neck for comfort.  Follow up in 1 week to see how you are doing.

## 2016-07-10 ENCOUNTER — Ambulatory Visit: Payer: Commercial Managed Care - HMO | Admitting: Family Medicine

## 2016-07-11 ENCOUNTER — Encounter: Payer: Self-pay | Admitting: Family Medicine

## 2016-07-11 ENCOUNTER — Ambulatory Visit (INDEPENDENT_AMBULATORY_CARE_PROVIDER_SITE_OTHER): Payer: Commercial Managed Care - HMO | Admitting: Family Medicine

## 2016-07-11 VITALS — BP 120/78 | HR 66 | Wt 285.8 lb

## 2016-07-11 DIAGNOSIS — Z09 Encounter for follow-up examination after completed treatment for conditions other than malignant neoplasm: Secondary | ICD-10-CM | POA: Diagnosis not present

## 2016-07-11 NOTE — Progress Notes (Signed)
   Subjective:    Patient ID: [name], male    DOB: 1961/04/20, 55 y.o.   MRN: [id number]  HPI Chief Complaint  Patient presents with  . follow-up    follow-up on pain. 30% better but still pain in knees and back   He is here for follow-up on multiple musculoskeletal issues.  Reports he has continued low back and bilateral knee pain. State he is some better but not totally. States he does have a history of chronic knee pain and has seen an orthopedist in the past for for this. Reports having had knee injections. He cannot recall who is orthopedist is.  States his neck pain has resolved. The multiple scratches he had at his previous visit have healed.  Has been taking aleve once daily with relie .   States he needs disability paperwork filled out. Reports he was declared disabled approximately 9 years ago due to COPD.   Denies fever, chills, headache, visual changes, chest pain, palpitations, shortness of breath, abdominal pain, N/V/D. No numbness, tingling, weakness.     Review of Systems Pertinent positives and negatives in the history of present illness.     Objective:   Physical Exam BP 120/78   Pulse 66   Wt 285 lb 12.8 oz (129.6 kg)   BMI 44.10 kg/m   Alert and oriented and in no acute distress. Normal gait. Well healed abrasion to his right lateral neck and right anterior lower leg.      Assessment & Plan:  Follow up  Discussed that overall he appears to be improving. Plan to have him give it more time and he may continue taking anti-inflammatories as needed for pain. He may need to see his orthopedist for knee pain if not continuing to improve as he does have a history of chronic knee pain. He will follow up in 1 month.   Advised him to contact disability office and have them recommend a provider to see to have continued documentation regarding his disability or follow up with the pulmonologist who declared him disabled.

## 2016-08-08 ENCOUNTER — Encounter: Payer: Self-pay | Admitting: Family Medicine

## 2016-08-08 ENCOUNTER — Ambulatory Visit (INDEPENDENT_AMBULATORY_CARE_PROVIDER_SITE_OTHER): Payer: Commercial Managed Care - HMO | Admitting: Family Medicine

## 2016-08-08 VITALS — BP 150/90 | HR 67 | Resp 16 | Wt 292.8 lb

## 2016-08-08 DIAGNOSIS — M25562 Pain in left knee: Secondary | ICD-10-CM | POA: Diagnosis not present

## 2016-08-08 DIAGNOSIS — G8929 Other chronic pain: Secondary | ICD-10-CM | POA: Diagnosis not present

## 2016-08-08 DIAGNOSIS — Z8739 Personal history of other diseases of the musculoskeletal system and connective tissue: Secondary | ICD-10-CM | POA: Diagnosis not present

## 2016-08-08 DIAGNOSIS — M25561 Pain in right knee: Secondary | ICD-10-CM | POA: Diagnosis not present
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
## 2016-08-22 ENCOUNTER — Other Ambulatory Visit: Payer: Self-pay | Admitting: Family Medicine

## 2016-08-22 DIAGNOSIS — K219 Gastro-esophageal reflux disease without esophagitis: Secondary | ICD-10-CM

## 2016-09-15 ENCOUNTER — Ambulatory Visit (INDEPENDENT_AMBULATORY_CARE_PROVIDER_SITE_OTHER): Payer: Commercial Managed Care - HMO

## 2016-09-15 ENCOUNTER — Ambulatory Visit (INDEPENDENT_AMBULATORY_CARE_PROVIDER_SITE_OTHER): Payer: Commercial Managed Care - HMO | Admitting: Orthopaedic Surgery

## 2016-09-15 ENCOUNTER — Encounter (INDEPENDENT_AMBULATORY_CARE_PROVIDER_SITE_OTHER): Payer: Self-pay | Admitting: Orthopaedic Surgery

## 2016-09-15 VITALS — Ht 68.0 in | Wt 278.0 lb

## 2016-09-15 DIAGNOSIS — M25562 Pain in left knee: Secondary | ICD-10-CM

## 2016-09-15 DIAGNOSIS — M25561 Pain in right knee: Secondary | ICD-10-CM | POA: Diagnosis not present

## 2016-09-15 DIAGNOSIS — G8929 Other chronic pain: Secondary | ICD-10-CM

## 2016-09-15 MED ORDER — METHYLPREDNISOLONE ACETATE 40 MG/ML IJ SUSP
80.0000 mg | INTRAMUSCULAR | Status: AC | PRN
  Administered 2016-09-15: 80 mg

## 2016-09-15 MED ORDER — BUPIVACAINE HCL 0.5 % IJ SOLN
3.0000 mL | INTRAMUSCULAR | Status: AC | PRN
  Administered 2016-09-15: 3 mL via INTRA_ARTICULAR

## 2016-09-15 MED ORDER — LIDOCAINE HCL 1 % IJ SOLN
5.0000 mL | INTRAMUSCULAR | Status: AC | PRN
  Administered 2016-09-15: 5 mL
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
## 2016-11-13 ENCOUNTER — Other Ambulatory Visit: Payer: Self-pay | Admitting: Family Medicine

## 2016-11-13 DIAGNOSIS — E876 Hypokalemia: Secondary | ICD-10-CM

## 2016-11-13 DIAGNOSIS — I10 Essential (primary) hypertension: Secondary | ICD-10-CM

## 2016-11-13 DIAGNOSIS — K219 Gastro-esophageal reflux disease without esophagitis: Secondary | ICD-10-CM

## 2016-11-17 ENCOUNTER — Ambulatory Visit: Payer: Commercial Managed Care - HMO | Admitting: Family Medicine

## 2016-11-19 ENCOUNTER — Ambulatory Visit (INDEPENDENT_AMBULATORY_CARE_PROVIDER_SITE_OTHER): Payer: Medicare HMO | Admitting: Family Medicine

## 2016-11-19 ENCOUNTER — Encounter: Payer: Self-pay | Admitting: Family Medicine

## 2016-11-19 VITALS — BP 120/82 | HR 64 | Ht 66.4 in | Wt 298.4 lb

## 2016-11-19 DIAGNOSIS — I10 Essential (primary) hypertension: Secondary | ICD-10-CM | POA: Diagnosis not present

## 2016-11-19 DIAGNOSIS — K219 Gastro-esophageal reflux disease without esophagitis: Secondary | ICD-10-CM

## 2016-11-19 DIAGNOSIS — E785 Hyperlipidemia, unspecified: Secondary | ICD-10-CM

## 2016-11-19 DIAGNOSIS — G4733 Obstructive sleep apnea (adult) (pediatric): Secondary | ICD-10-CM

## 2016-11-19 NOTE — Patient Instructions (Signed)
Come in for a lab visit next week anytime after 8:30 am to have fasting labs (nothing to eat or drink except for 8 hours).
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
## 2017-05-17 ENCOUNTER — Other Ambulatory Visit: Payer: Self-pay | Admitting: Family Medicine

## 2017-05-17 DIAGNOSIS — I10 Essential (primary) hypertension: Secondary | ICD-10-CM

## 2017-05-17 DIAGNOSIS — E876 Hypokalemia: Secondary | ICD-10-CM

## 2017-05-18 NOTE — Telephone Encounter (Signed)
Pt is coming in tomorrow for labs but it out of meds. Will refill for 30 days only at this point

## 2017-05-19 ENCOUNTER — Other Ambulatory Visit: Payer: Medicare HMO

## 2017-05-19 DIAGNOSIS — E785 Hyperlipidemia, unspecified: Secondary | ICD-10-CM | POA: Diagnosis not present

## 2017-05-19 DIAGNOSIS — I10 Essential (primary) hypertension: Secondary | ICD-10-CM | POA: Diagnosis not present

## 2017-05-19 LAB — COMPREHENSIVE METABOLIC PANEL
AG RATIO: 1.5 (calc) (ref 1.0–2.5)
ALT: 11 U/L (ref 9–46)
AST: 18 U/L (ref 10–35)
Albumin: 3.8 g/dL (ref 3.6–5.1)
Alkaline phosphatase (APISO): 41 U/L (ref 40–115)
BILIRUBIN TOTAL: 0.3 mg/dL (ref 0.2–1.2)
BUN: 17 mg/dL (ref 7–25)
CHLORIDE: 104 mmol/L (ref 98–110)
CO2: 29 mmol/L (ref 20–32)
Calcium: 9.2 mg/dL (ref 8.6–10.3)
Creat: 1.17 mg/dL (ref 0.70–1.33)
GLOBULIN: 2.6 g/dL (ref 1.9–3.7)
GLUCOSE: 81 mg/dL (ref 65–99)
Potassium: 4.3 mmol/L (ref 3.5–5.3)
SODIUM: 139 mmol/L (ref 135–146)
Total Protein: 6.4 g/dL (ref 6.1–8.1)

## 2017-05-19 LAB — LIPID PANEL
Cholesterol: 189 mg/dL (ref <200)
HDL: 63 mg/dL (ref >40)
LDL Cholesterol (Calc): 108 mg/dL (calc) — ABNORMAL HIGH
Non-HDL Cholesterol (Calc): 126 mg/dL (calc) (ref <130)
TRIGLYCERIDES: 87 mg/dL (ref <150)
Total CHOL/HDL Ratio: 3 (calc) (ref <5.0)

## 2017-05-19 LAB — CBC WITH DIFFERENTIAL/PLATELET
BASOS PCT: 1 %
Basophils Absolute: 70 cells/uL (ref 0–200)
EOS PCT: 10.3 %
Eosinophils Absolute: 721 cells/uL — ABNORMAL HIGH (ref 15–500)
HEMATOCRIT: 40.2 % (ref 38.5–50.0)
HEMOGLOBIN: 13.1 g/dL — AB (ref 13.2–17.1)
LYMPHS ABS: 1176 {cells}/uL (ref 850–3900)
MCH: 29.9 pg (ref 27.0–33.0)
MCHC: 32.6 g/dL (ref 32.0–36.0)
MCV: 91.8 fL (ref 80.0–100.0)
MPV: 11.2 fL (ref 7.5–12.5)
Monocytes Relative: 6.6 %
NEUTROS ABS: 4571 {cells}/uL (ref 1500–7800)
Neutrophils Relative %: 65.3 %
Platelets: 222 10*3/uL (ref 140–400)
RBC: 4.38 10*6/uL (ref 4.20–5.80)
RDW: 14 % (ref 11.0–15.0)
Total Lymphocyte: 16.8 %
WBC mixed population: 462 cells/uL (ref 200–950)
WBC: 7 10*3/uL (ref 3.8–10.8)

## 2017-06-22 ENCOUNTER — Other Ambulatory Visit: Payer: Self-pay | Admitting: Family Medicine

## 2017-06-22 DIAGNOSIS — E876 Hypokalemia: Secondary | ICD-10-CM

## 2017-06-22 DIAGNOSIS — I10 Essential (primary) hypertension: Secondary | ICD-10-CM

## 2017-08-08 ENCOUNTER — Other Ambulatory Visit: Payer: Self-pay | Admitting: Family Medicine

## 2017-08-08 DIAGNOSIS — K219 Gastro-esophageal reflux disease without esophagitis: Secondary | ICD-10-CM

## 2017-08-13 ENCOUNTER — Ambulatory Visit: Payer: Medicare HMO | Admitting: Family Medicine

## 2017-08-13 ENCOUNTER — Encounter: Payer: Self-pay | Admitting: Family Medicine

## 2017-08-13 VITALS — BP 128/94 | HR 73 | Temp 99.0°F | Wt 303.6 lb

## 2017-08-13 DIAGNOSIS — M25562 Pain in left knee: Secondary | ICD-10-CM | POA: Diagnosis not present

## 2017-08-13 DIAGNOSIS — M25542 Pain in joints of left hand: Secondary | ICD-10-CM

## 2017-08-13 DIAGNOSIS — M62838 Other muscle spasm: Secondary | ICD-10-CM

## 2017-08-13 DIAGNOSIS — I10 Essential (primary) hypertension: Secondary | ICD-10-CM | POA: Diagnosis not present

## 2017-08-13 DIAGNOSIS — G8929 Other chronic pain: Secondary | ICD-10-CM

## 2017-08-13 MED ORDER — CYCLOBENZAPRINE HCL 5 MG PO TABS: 5.0000 mg | ORAL_TABLET | Freq: Three times a day (TID) | ORAL | 0 refills | Status: DC | PRN

## 2017-08-13 NOTE — Patient Instructions (Addendum)
Take 2 Aleve twice daily with food.  Use heat on your neck and you may want to try ice on your left knee. You may try the flexeril if needed for your neck spasm, this is sedating so do not drive or drink alcohol with it.   You can try a topical pain medication over-the-counter such as salon pause, Arnica gel, lidocaine patches.  You may want to ask the pharmacy staff to help you select something.  You may need to call and schedule with your orthopedist for chronic left knee pain since they have not seen you in the past for this.  You saw Dr. Cleophas Dunker last in January 2018.   Your blood pressure is elevated today.  Let us have you return in 4 weeks to reevaluate this. Continue taking her medication daily.  Cut back on salt intake.

## 2017-08-13 NOTE — Progress Notes (Signed)
Subjective:    Patient ID: [name], male    DOB: 10/29/1960, 56 y.o.   MRN: [id number]  HPI Chief Complaint  Patient presents with  . Acute Visit    neck-bending pain goes from neck to shoulder blade, finger-swelling, hard to bend; knee- hurts when walking, no bending. 2 week occurance, no OTC medications   He is here with complaints of a 2 week history of neck pain that radiates into his right shoulder near his scapula. States he woke up with this pain. He has not been taking anything for pain. Pain is worse with neck extension and right rotation.  Denies history of neck pain or surgery.  Denies numbness, tingling, weakness of upper extremities.  He is also having left knee pain.  History of chronic knee pain and has had steroid injection for left knee pain multiple times in the past.  States he has been told he may need a knee replacement.  No new injury.   Pain is worse with ambulation and improved with rest.  No locking or giving way. Denies fever, chills, dizziness, chest pain, palpitations, shortness of breath, abdominal pain, N/V/D, urinary symptoms, LE edema.   States he has been taking his BP medication.  He does not check his blood pressure at home.  He is aware that his blood pressure is elevated today.  He has gained weight, 5 pounds since his last visit.  He also reports middle finger on left hand with intermittent swelling and pain.  Denies injury.  No redness or warmth.  No other finger joints involved.  He has not taken any medication for this.  Reviewed allergies, medications, past medical, surgical, and social history.    Review of Systems Pertinent positives and negatives in the history of present illness.     Objective:   Physical Exam  Constitutional: He is oriented to person, place, and time. He appears well-developed and well-nourished. No distress.  HENT:  Mouth/Throat: Oropharynx is clear and moist.  Eyes: Conjunctivae are normal. Pupils are equal,  round, and reactive to light.  Neck: Neck supple. Muscular tenderness present.    Pain with neck extension and right rotation. Right trapezius with profound tenderness and spasm present.   Musculoskeletal:       Left knee: Normal.       Cervical back: He exhibits spasm. He exhibits no bony tenderness.       Left hand: Normal.  Neurological: He is alert and oriented to person, place, and time. He has normal strength. No cranial nerve deficit.   BP (!) 128/94 (BP Location: Left Arm, Patient Position: Sitting)   Pulse 73   Temp 99 F (37.2 C) (Oral)   Wt (!) 303 lb 9.6 oz (137.7 kg)   SpO2 98%   BMI 48.41 kg/m       Assessment & Plan:  Neck muscle spasm  Essential hypertension  Pain involving joint of finger of left hand  Chronic pain of left knee  Discussed that he appears to have a muscle spasm.  No radicular symptoms.  We will treat this conservatively with Aleve, heat, topical analgesic over-the-counter and Flexeril as needed.  Advised that Flexeril may make him drowsy and he should avoid alcohol or driving while on this medication. He has a history of chronic knee pain and his current symptoms are unchanged.  Recommend Aleve and ice for this.  If symptoms are not improving he will follow-up with his orthopedist.  We discussed that weight  loss will help with knee pain. Finger pain is most likely osteoarthritis.  Offered x-ray and he declined.  Plan to treat this conservatively with Aleve and he will let me know if his symptoms are worsening.  No infectious process. Discussed that his blood pressure is elevated and I recommend that he start checking it outside of here.  I will see him back in 4 weeks for hypertension follow-up.

## 2017-08-27 ENCOUNTER — Other Ambulatory Visit: Payer: Self-pay | Admitting: Family Medicine

## 2017-08-27 NOTE — Telephone Encounter (Signed)
Ok

## 2017-08-27 NOTE — Telephone Encounter (Signed)
Is this okay to refill? 

## 2017-09-16 ENCOUNTER — Encounter: Payer: Self-pay | Admitting: Family Medicine

## 2017-09-16 ENCOUNTER — Ambulatory Visit (INDEPENDENT_AMBULATORY_CARE_PROVIDER_SITE_OTHER): Payer: Medicare HMO | Admitting: Family Medicine

## 2017-09-16 VITALS — BP 142/92 | HR 71 | Ht 68.0 in | Wt 301.2 lb

## 2017-09-16 DIAGNOSIS — I10 Essential (primary) hypertension: Secondary | ICD-10-CM

## 2017-09-16 DIAGNOSIS — I509 Heart failure, unspecified: Secondary | ICD-10-CM

## 2017-09-16 DIAGNOSIS — G4733 Obstructive sleep apnea (adult) (pediatric): Secondary | ICD-10-CM

## 2017-09-16 LAB — COMPLETE METABOLIC PANEL WITH GFR
AG RATIO: 1.4 (calc) (ref 1.0–2.5)
ALT: 11 U/L (ref 9–46)
AST: 18 U/L (ref 10–35)
Albumin: 3.8 g/dL (ref 3.6–5.1)
Alkaline phosphatase (APISO): 44 U/L (ref 40–115)
BUN: 14 mg/dL (ref 7–25)
CALCIUM: 9.2 mg/dL (ref 8.6–10.3)
CO2: 26 mmol/L (ref 20–32)
Chloride: 102 mmol/L (ref 98–110)
Creat: 0.9 mg/dL (ref 0.70–1.33)
GFR, EST AFRICAN AMERICAN: 110 mL/min/{1.73_m2} (ref >=60)
GFR, EST NON AFRICAN AMERICAN: 95 mL/min/{1.73_m2} (ref >=60)
Globulin: 2.8 g/dL (calc) (ref 1.9–3.7)
Glucose, Bld: 82 mg/dL (ref 65–99)
POTASSIUM: 4.2 mmol/L (ref 3.5–5.3)
Sodium: 136 mmol/L (ref 135–146)
TOTAL PROTEIN: 6.6 g/dL (ref 6.1–8.1)
Total Bilirubin: 0.3 mg/dL (ref 0.2–1.2)

## 2017-09-16 LAB — CBC WITH DIFFERENTIAL/PLATELET
Basophils Absolute: 103 cells/uL (ref 0–200)
Basophils Relative: 1.1 %
Eosinophils Absolute: 921 cells/uL — ABNORMAL HIGH (ref 15–500)
Eosinophils Relative: 9.8 %
HEMATOCRIT: 39 % (ref 38.5–50.0)
Hemoglobin: 13.1 g/dL — ABNORMAL LOW (ref 13.2–17.1)
Lymphs Abs: 1598 cells/uL (ref 850–3900)
MCH: 30.4 pg (ref 27.0–33.0)
MCHC: 33.6 g/dL (ref 32.0–36.0)
MCV: 90.5 fL (ref 80.0–100.0)
MPV: 10.4 fL (ref 7.5–12.5)
Monocytes Relative: 5.9 %
Neutro Abs: 6223 cells/uL (ref 1500–7800)
Neutrophils Relative %: 66.2 %
Platelets: 277 10*3/uL (ref 140–400)
RBC: 4.31 10*6/uL (ref 4.20–5.80)
RDW: 13.6 % (ref 11.0–15.0)
Total Lymphocyte: 17 %
WBC: 9.4 10*3/uL (ref 3.8–10.8)
WBCMIX: 555 {cells}/uL (ref 200–950)

## 2017-09-16 MED ORDER — LISINOPRIL 10 MG PO TABS: 10.0000 mg | ORAL_TABLET | Freq: Every day | ORAL | 2 refills | Status: DC

## 2017-09-16 NOTE — Progress Notes (Signed)
Subjective:    Patient ID: [name], male    DOB: 28-Nov-1960, 57 y.o.   MRN: [id number]  HPI Chief Complaint  Patient presents with  . Follow-up    left knee pain, neck pain, back spasms    He is here for follow up on HTN.  Diagnosed with HTN 10 or so years ago and CHF at that time also. States he does not have the means to check his BP at home. States he has taken the same medication for his BP for at least 10 years.   Reports having a history of COPD however I do not see this in his record and he is not on any medication for this.  States he was taken out of work on disability due to COPD, CHF and chronic knee and back pain several years ago by Dr. Felipa Eth at [facility] cardiology.  He has not followed up with cardiology since then.    States conservative treatment for neck, back and knee pain has not worked.   He has a history of OSA without CPAP use. When questioned about this, he reports that he has been using oxygen via nasal cannula for 10 years approximately. States he uses it some nights but mainly during the day. States he gets this from [employer]. Denies being re-evaluated for this at any point over the past 10 years. States he does not sleep well and wakes up every hour or two. He is not sure if his oxygen drops at night or if he has apnea spells. Reports excessive fatigue most days and does not feel well rested when waking up in the morning.  Notes occasionally having some mild dyspnea with exertion but not always.   Denies fever, chills, night sweats, unexplained weight loss, sore throat, cough, shortness of breath at rest, wheezing, abdominal pain, N/V/D, LE edema, skin changes or rash.   No numbness, tingling or weakness of extremities.  Reports having chronic back and knee pain and has failed conservative therapy. Has seen Dr. Cleophas Dunker in the past for this and was told he may need a knee replacement. He is ready to return to see his orthopedist. He plans to  call and schedule this.   Reviewed allergies, medications, past medical, surgical, family, and social history.   Review of Systems Pertinent positives and negatives in the history of present illness.     Objective:   Physical Exam BP (!) 142/92 (BP Location: Right Arm, Patient Position: Sitting)   Pulse 71   Ht 5\' 8"  (1.727 m)   Wt (!) 301 lb 3.2 oz (136.6 kg)   SpO2 97%   BMI 45.80 kg/m   Alert and in no distress.  Pharyngeal area is normal. Neck is supple without adenopathy or thyromegaly. Cardiac exam shows a regular sinus rhythm without murmurs or gallops. Lungs are clear to auscultation. Extremities without edema,normal pulses.       Assessment & Plan:  Uncontrolled hypertension - Plan: lisinopril (PRINIVIL,ZESTRIL) 10 MG tablet, CBC with Differential/Platelet, COMPLETE METABOLIC PANEL WITH GFR  Congestive heart failure, unspecified HF chronicity, unspecified heart failure type (HCC) - Plan: CBC with Differential/Platelet, COMPLETE METABOLIC PANEL WITH GFR  OSA (obstructive sleep apnea) - Plan: Split night study  Morbid obesity (HCC) - Plan: Split night study  BP is not well controlled. Plan to check labs and add lisinopril to his current medication regimen. No cardiopulmonary symptoms.  I walked with him around our office while he was wearing a pulse oximeter  and his oxygen level did not drop, remained 97-98% at all times. He did not become symptomatic.  Discussed lifestyle modifications for HTN, weight loss.  Plan to send him for a sleep study due to history of OSA that is not being treated with CPAP. I do not have his previous sleep study. Will check also for nighttime pulse oximetry.  This may be affecting his BP.  He apparently has failed conservative treatment for back and knee pain. He will call and schedule with his orthopedist for further eval and treatment of chronic pain. Declines XR and will wait until he sees Dr. Cleophas Dunker.  Follow up pending labs and sleep  study or in 4-6 weeks for HTN.

## 2017-09-16 NOTE — Patient Instructions (Signed)
Your blood pressure is still not well controlled. I am sending you for a sleep study and adding a new medication called lisinopril to your current medication regimen.  We will call you with lab results.  Follow up with me in 4-6 weeks.   Follow up with Dr. Cleophas Dunker for chronic knee and back pain.  [phone] [address] [place]

## 2017-09-17 ENCOUNTER — Telehealth: Payer: Self-pay

## 2017-09-17 NOTE — Telephone Encounter (Signed)
Called patient and gave lab results. Patient had no questions or concerns.  

## 2017-09-17 NOTE — Telephone Encounter (Signed)
-----   Message from Avanell Shackleton, NP-C sent at 09/16/2017 10:33 PM EST ----- His eosinophils (one type of his white blood cells) is mildly elevated. We will recheck this in 6 months.

## 2017-09-29 ENCOUNTER — Other Ambulatory Visit: Payer: Self-pay | Admitting: *Deleted

## 2017-09-29 DIAGNOSIS — G4733 Obstructive sleep apnea (adult) (pediatric): Secondary | ICD-10-CM

## 2017-10-05 ENCOUNTER — Ambulatory Visit (INDEPENDENT_AMBULATORY_CARE_PROVIDER_SITE_OTHER): Payer: Medicare HMO | Admitting: Orthopaedic Surgery

## 2017-10-06 ENCOUNTER — Ambulatory Visit (INDEPENDENT_AMBULATORY_CARE_PROVIDER_SITE_OTHER): Payer: Medicare HMO | Admitting: Orthopaedic Surgery

## 2017-10-06 ENCOUNTER — Encounter (INDEPENDENT_AMBULATORY_CARE_PROVIDER_SITE_OTHER): Payer: Self-pay | Admitting: Orthopaedic Surgery

## 2017-10-06 VITALS — BP 132/82 | HR 62 | Resp 18 | Ht 68.0 in | Wt 285.0 lb

## 2017-10-06 DIAGNOSIS — M1712 Unilateral primary osteoarthritis, left knee: Secondary | ICD-10-CM | POA: Diagnosis not present

## 2017-10-06 MED ORDER — LIDOCAINE HCL 1 % IJ SOLN
2.0000 mL | INTRAMUSCULAR | Status: AC | PRN
  Administered 2017-10-06: 2 mL

## 2017-10-06 MED ORDER — BUPIVACAINE HCL 0.5 % IJ SOLN
2.0000 mL | INTRAMUSCULAR | Status: AC | PRN
  Administered 2017-10-06: 2 mL via INTRA_ARTICULAR

## 2017-10-06 MED ORDER — METHYLPREDNISOLONE ACETATE 40 MG/ML IJ SUSP
80.0000 mg | INTRAMUSCULAR | Status: AC | PRN
  Administered 2017-10-06: 80 mg

## 2017-10-06 NOTE — Progress Notes (Signed)
Office Visit Note   Patient: [name]           Date of Birth: 1960-09-13           MRN: [id number] Visit Date: 10/06/2017              Requested by: Avanell Shackleton, NP-C [address] [place], [place] [address] PCP: Avanell Shackleton, NP-C   Assessment & Plan: Visit Diagnoses:  1. Unilateral primary osteoarthritis, left knee     Plan: end-stage osteoarthritis left knee. Long discussion regarding severity of the arthritis and different treatment options including NSAIDs, cortisone injection, bracing. We also had a long discussion regarding weight loss as his BMI is 43. I discussed the different potential problems with his weight and surgerywhich would be the definitive procedure I.e. knee replacement.He is trying to lose weight.total time in office over 45 minutes discussing his diagnosis, treatment options and counseling regarding weight loss and exercises  Follow-Up Instructions: Return if symptoms worsen or fail to improve.   Orders:  No orders of the defined types were placed in this encounter.  No orders of the defined types were placed in this encounter.     Procedures: Large Joint Inj: L knee on 10/06/2017 3:39 PM Indications: pain and diagnostic evaluation Details: 25 G 1.5 in needle, anteromedial approach  Arthrogram: No  Medications: 2 mL lidocaine 1 %; 2 mL bupivacaine 0.5 %; 80 mg methylPREDNISolone acetate 40 MG/ML Procedure, treatment alternatives, risks and benefits explained, specific risks discussed. Consent was given by the patient. Patient was prepped and draped in the usual sterile fashion.       Clinical Data: No additional findings.   Subjective: Chief Complaint  Patient presents with  . Left Knee - Pain  . Knee Pain    Left knee pain x 1 month, swelling, popping, clicking, grinding, weakness, numbness, inj did help from last visit, difficulty walking, Tylenol helps very little, no injury, no surgery to left knee, not diabetic    actually has had trouble with his knee for over a month but is reached a point where he thought he needed to have "someone look at it". He does care for his brother who has hada stroke and is having difficulty liftinghim. He oftentimes has to walk "stiff leg it". No back or hip pain. No numbness or tingling. He has tried some over-the-counter medicines which seemed to give him some very temporary relief of his pain. He recently had x-rays of his knee that demonstrated end-stage osteoarthritis with complete collapse of the medialcompartment, subchondral sclerosis and large osteophytes. Similar findings laterally. There was significant varus. Basically he has end-stage osteoarthritis. He is not diabetic  HPI  Review of Systems  Constitutional: Positive for activity change.  HENT: Negative for trouble swallowing.   Eyes: Negative for pain.  Respiratory: Negative for shortness of breath.   Cardiovascular: Positive for leg swelling.  Gastrointestinal: Negative for constipation.  Endocrine: Negative for cold intolerance.  Genitourinary: Negative for difficulty urinating.  Musculoskeletal: Positive for gait problem and joint swelling.  Skin: Negative for rash.  Allergic/Immunologic: Positive for food allergies.  Neurological: Positive for weakness and numbness.  Hematological: Does not bruise/bleed easily.  Psychiatric/Behavioral: Negative for sleep disturbance.     Objective: Vital Signs: BP 132/82 (BP Location: Left Arm, Patient Position: Sitting, Cuff Size: Normal)   Pulse 62   Resp 18   Ht 5\' 8"  (1.727 m)   Wt 285 lb (129.3 kg)   BMI 43.33 kg/m  Physical Exam  Ortho Examawake alert and oriented 3. Comfortable sitting. Does walk with a limp referable to the left knee. Increased varus with weightbearing. The distal edema. Neurovascular exam intact Full extension left knee with only 90 of flexion. Small effusion. Diffuse medial and lateral joint pain associated with patella  crepitation. No popliteal pain. No calf discomfort. Large thighs and large abdominal panniculus consistent with his BMI of 43 Specialty Comments:  No specialty comments available.  Imaging: No results found.   PMFS History: Patient Active Problem List   Diagnosis Date Noted  . HTN (hypertension) 04/23/2016  . Esophageal reflux 04/23/2016  . Morbid obesity (HCC) 04/23/2016  . Hypokalemia 04/23/2016  . OSA (obstructive sleep apnea) 04/23/2016  . Personal history of noncompliance with medical treatment, presenting hazards to health 04/23/2016  . History of proteinuria syndrome 04/23/2016  . History of erectile dysfunction 04/23/2016  . History of pneumonia 04/23/2016  . CHF (congestive heart failure) (HCC) 04/23/2016  . Rectal fissure 03/24/2013  . Hemorrhoid 08/18/2012   Past Medical History:  Diagnosis Date  . Asthma    mild, no inhaler used  . CHF (congestive heart failure) (HCC)   . DJD (degenerative joint disease)    has taken tramadol 50mg  q8h in past  . GERD (gastroesophageal reflux disease)   . Hyperlipidemia   . Hypertension   . Morbid obesity (HCC)    weighed in excess of 500 lbs at one point  . OSA (obstructive sleep apnea)    did not tolerate CPAP, uses nasal cannula  . Proteinuria    GFR 94.12 February 2015    Family History  Problem Relation Age of Onset  . Heart disease Father        MI, DM2, HTN  . Hypertension Mother        died at 34 DM2, HTN  . Cancer Mother     Past Surgical History:  Procedure Laterality Date  . COLONOSCOPY N/A 04/22/2013   Procedure: COLONOSCOPY;  Surgeon: 04/24/2013, MD;  Location: [facility];  Service: General;  Laterality: N/A;  . leg surgery for fracture Right 2008 or 2009  . SPHINCTEROTOMY Left 04/22/2013   Procedure: Exam under anesthesia, left lateral internal SPHINCTEROTOMY;  Surgeon: 04/24/2013, MD;  Location: [facility];  Service: General;  Laterality: Left;   Social History   Occupational History  . Not on file    Tobacco Use  . Smoking status: Former Smoker    Packs/day: 0.20    Years: 1.50    Pack years: 0.30    Types: Cigarettes    Last attempt to quit: 1982    Years since quitting: 37.1  . Smokeless tobacco: Never Used  Substance and Sexual Activity  . Alcohol use: Yes    Alcohol/week: 1.8 oz    Types: 3 Standard drinks or equivalent per week    Comment: 3 drinks of rum per week  . Drug use: No  . Sexual activity: Not on file

## 2017-10-21 ENCOUNTER — Ambulatory Visit (HOSPITAL_BASED_OUTPATIENT_CLINIC_OR_DEPARTMENT_OTHER): Payer: Medicare HMO | Attending: Family Medicine | Admitting: Internal Medicine

## 2017-10-21 VITALS — Ht 69.0 in | Wt 285.0 lb

## 2017-10-21 DIAGNOSIS — G4733 Obstructive sleep apnea (adult) (pediatric): Secondary | ICD-10-CM | POA: Insufficient documentation

## 2017-10-21 DIAGNOSIS — Z6841 Body Mass Index (BMI) 40.0 and over, adult: Secondary | ICD-10-CM | POA: Diagnosis not present

## 2017-10-28 ENCOUNTER — Ambulatory Visit: Payer: Medicare HMO | Admitting: Family Medicine

## 2017-10-31 DIAGNOSIS — G4733 Obstructive sleep apnea (adult) (pediatric): Secondary | ICD-10-CM | POA: Diagnosis not present

## 2017-10-31 NOTE — Procedures (Signed)
    Patient Name: [name], [name] Date: 10/22/2017 Gender: Male D.O.B: 12/05/1960 Age (years): 50 Referring Provider: Avanell Shackleton NP Height (inches): 69 Interpreting Physician: Jetty Duhamel MD, ABSM Weight (lbs): 285 RPSGT: Celene Kras BMI: 42 MRN: [id number] Neck Size: 20.00 <br> <br> CLINICAL INFORMATION Sleep Study Type: HST Indication for sleep study: OSA  Epworth Sleepiness Score: 4  SLEEP STUDY TECHNIQUE A multi-channel overnight portable sleep study was performed. The channels recorded were: nasal airflow, thoracic respiratory movement, and oxygen saturation with a pulse oximetry. Snoring was also monitored.  MEDICATIONS Patient self administered medications include: none reported.  SLEEP ARCHITECTURE Patient was studied for 320.5 minutes. The sleep efficiency was 96.9 % and the patient was supine for 67.3%. The arousal index was 0.0 per hour.  RESPIRATORY PARAMETERS The overall AHI was 19.3 per hour, with a central apnea index of 0.0 per hour.  The oxygen nadir was 84% during sleep.  CARDIAC DATA Mean heart rate during sleep was 72.0 bpm.  IMPRESSIONS - Moderate obstructive sleep apnea occurred during this study (AHI = 19.3/h). - No significant central sleep apnea occurred during this study (CAI = 0.0/h). - Moderate oxygen desaturation was noted during this study (Min O2 = 84%). Mean 92%. Time - Patient snored.  DIAGNOSIS - Obstructive Sleep Apnea (327.23 [G47.33 ICD-10])  RECOMMENDATIONS - Recommend CPAP titration or AutoPAP. Other options would be based on clinical judgment. - Sleep hygiene should be reviewed to assess factors that may improve sleep quality. - Weight management and regular exercise should be initiated or continued.  [Electronically signed] 10/31/2017 10:36 AM  Jetty Duhamel MD, ABSM Diplomate, American Board of Sleep Medicine   NPI: [id number]                         Jetty Duhamel Diplomate,  American Board of Sleep Medicine  ELECTRONICALLY SIGNED ON:  10/31/2017, 10:33 AM [facility] PH: [phone]   FX: [phone] ACCREDITED BY THE AMERICAN ACADEMY OF SLEEP MEDICINE

## 2017-11-02 ENCOUNTER — Other Ambulatory Visit: Payer: Self-pay | Admitting: Family Medicine

## 2017-11-02 DIAGNOSIS — G4733 Obstructive sleep apnea (adult) (pediatric): Secondary | ICD-10-CM

## 2017-11-05 ENCOUNTER — Telehealth: Payer: Self-pay

## 2017-11-05 NOTE — Telephone Encounter (Signed)
Left message for pt to call me back  Called and spoke to [place] at [facility], she called pt and advised him that they got the order and that they would have to process it through insurance and it could take 10-14 days. Pt will not be sent a cpap machine to his house as he would have to come in the office for that to get it. Once pt is using cpap machine for a month, he must come back in the office for a follow-up visit for insurance purposes.

## 2017-11-05 NOTE — Telephone Encounter (Signed)
Pt says that he had sleep study and pt was dx with sleep apnea. Pt says he was advised that cpap machine will be sent out to home in ten to fourteen days . Pt was also advised that he was to return to office to discuss the c pap machine. Please advise if appt is needhenr. Please advise  Thanks [facility]

## 2017-11-05 NOTE — Telephone Encounter (Signed)
Pt was advised on how to process orders work

## 2017-11-05 NOTE — Telephone Encounter (Signed)
Please call him to clarify.

## 2017-11-20 DIAGNOSIS — G4733 Obstructive sleep apnea (adult) (pediatric): Secondary | ICD-10-CM | POA: Diagnosis not present

## 2017-12-06 ENCOUNTER — Other Ambulatory Visit: Payer: Self-pay | Admitting: Family Medicine

## 2017-12-06 DIAGNOSIS — K219 Gastro-esophageal reflux disease without esophagitis: Secondary | ICD-10-CM

## 2017-12-13 ENCOUNTER — Other Ambulatory Visit: Payer: Self-pay | Admitting: Family Medicine

## 2017-12-13 DIAGNOSIS — I10 Essential (primary) hypertension: Secondary | ICD-10-CM

## 2017-12-13 DIAGNOSIS — E876 Hypokalemia: Secondary | ICD-10-CM

## 2017-12-21 DIAGNOSIS — G4733 Obstructive sleep apnea (adult) (pediatric): Secondary | ICD-10-CM | POA: Diagnosis not present

## 2017-12-23 ENCOUNTER — Ambulatory Visit (INDEPENDENT_AMBULATORY_CARE_PROVIDER_SITE_OTHER): Payer: Medicare HMO | Admitting: Family Medicine

## 2017-12-23 ENCOUNTER — Encounter: Payer: Self-pay | Admitting: Family Medicine

## 2017-12-23 VITALS — BP 140/90 | HR 71 | Ht 67.0 in | Wt 307.6 lb

## 2017-12-23 DIAGNOSIS — Z9119 Patient's noncompliance with other medical treatment and regimen: Secondary | ICD-10-CM

## 2017-12-23 DIAGNOSIS — G4733 Obstructive sleep apnea (adult) (pediatric): Secondary | ICD-10-CM | POA: Diagnosis not present

## 2017-12-23 DIAGNOSIS — Z91199 Patient's noncompliance with other medical treatment and regimen due to unspecified reason: Secondary | ICD-10-CM

## 2017-12-23 NOTE — Progress Notes (Signed)
   Subjective:    Patient ID: [name], male    DOB: [birth year], 57 y.o.   MRN: [id number]  HPI Chief Complaint  Patient presents with  . cpap    cpap machine is working well but he is returning it today    He is here to follow up on sleep apnea and CPAP use. He reports using it nightly for the past 4 weeks and states he can not really tell a major difference in his sleep. States he "slept fine before the CPAP". Good compliance report from [employer].  States he cannot afford the $45 per month to keep the machine so he is going to turn it in after our visit.   He is aware that he has gained a significant amount more weight.   Denies fever, chills, chest pain, palpitations, shortness of breath, N/V/D.   Reviewed allergies, medications, past medical, surgical, family, and social history.   Review of Systems Pertinent positives and negatives in the history of present illness.      Objective:   Physical Exam BP 140/90   Pulse 71   Ht 5\' 7"  (1.702 m)   Wt (!) 307 lb 9.6 oz (139.5 kg)   BMI 48.18 kg/m  Alert and oriented and in no acute distress. Not otherwise examined.       Assessment & Plan:  OSA (obstructive sleep apnea)  Morbid obesity (HCC)  Personal history of noncompliance with medical treatment, presenting hazards to health  He appears to be doing well on the CPAP but due to finances plans to turn it in today. Strongly encouraged him to check with [employer] to see about financial assistance with the machine. Advised that untreated sleep apnea may lead to heart failure, stroke, uncontrolled HTN, weight gain and other chronic and life threatening health issues.  He verbalized understanding.

## 2018-01-16 ENCOUNTER — Other Ambulatory Visit: Payer: Self-pay | Admitting: Family Medicine

## 2018-01-16 DIAGNOSIS — I10 Essential (primary) hypertension: Secondary | ICD-10-CM

## 2018-01-16 DIAGNOSIS — E876 Hypokalemia: Secondary | ICD-10-CM

## 2018-01-18 NOTE — Telephone Encounter (Signed)
This okay to refill? 

## 2018-01-18 NOTE — Telephone Encounter (Signed)
ok 

## 2018-01-20 DIAGNOSIS — G4733 Obstructive sleep apnea (adult) (pediatric): Secondary | ICD-10-CM | POA: Diagnosis not present

## 2018-02-17 ENCOUNTER — Other Ambulatory Visit: Payer: Self-pay | Admitting: Family Medicine

## 2018-02-17 DIAGNOSIS — I10 Essential (primary) hypertension: Secondary | ICD-10-CM

## 2018-02-17 NOTE — Telephone Encounter (Signed)
Ok to refill for 90 days and will need an office visit

## 2018-02-17 NOTE — Telephone Encounter (Signed)
Is this ok?

## 2018-02-18 NOTE — Telephone Encounter (Signed)
Left message for pt to call back and scheduled a follow-up appt for HTN

## 2018-02-20 DIAGNOSIS — G4733 Obstructive sleep apnea (adult) (pediatric): Secondary | ICD-10-CM | POA: Diagnosis not present

## 2018-03-22 DIAGNOSIS — G4733 Obstructive sleep apnea (adult) (pediatric): Secondary | ICD-10-CM | POA: Diagnosis not present

## 2018-03-26 ENCOUNTER — Other Ambulatory Visit: Payer: Self-pay | Admitting: Family Medicine

## 2018-03-26 DIAGNOSIS — K219 Gastro-esophageal reflux disease without esophagitis: Secondary | ICD-10-CM

## 2018-04-16 ENCOUNTER — Ambulatory Visit (INDEPENDENT_AMBULATORY_CARE_PROVIDER_SITE_OTHER): Payer: Medicare HMO | Admitting: Family Medicine

## 2018-04-16 ENCOUNTER — Encounter: Payer: Self-pay | Admitting: Family Medicine

## 2018-04-16 VITALS — BP 110/70 | HR 69 | Wt 303.8 lb

## 2018-04-16 DIAGNOSIS — G4733 Obstructive sleep apnea (adult) (pediatric): Secondary | ICD-10-CM

## 2018-04-16 DIAGNOSIS — M25561 Pain in right knee: Secondary | ICD-10-CM | POA: Diagnosis not present

## 2018-04-16 DIAGNOSIS — I10 Essential (primary) hypertension: Secondary | ICD-10-CM | POA: Diagnosis not present

## 2018-04-16 DIAGNOSIS — D721 Eosinophilia, unspecified: Secondary | ICD-10-CM | POA: Insufficient documentation

## 2018-04-16 DIAGNOSIS — E7800 Pure hypercholesterolemia, unspecified: Secondary | ICD-10-CM

## 2018-04-16 DIAGNOSIS — M25562 Pain in left knee: Secondary | ICD-10-CM | POA: Diagnosis not present

## 2018-04-16 DIAGNOSIS — K219 Gastro-esophageal reflux disease without esophagitis: Secondary | ICD-10-CM | POA: Diagnosis not present

## 2018-04-16 DIAGNOSIS — G8929 Other chronic pain: Secondary | ICD-10-CM | POA: Diagnosis not present

## 2018-04-16 NOTE — Progress Notes (Signed)
Subjective:    Patient ID: [name], male    DOB: 06-Jun-1961, 57 y.o.   MRN: [id number]  HPI Chief Complaint  Patient presents with  . follow-up    follow-up on aches and pains   Presents today with complaints of bilateral chronic knee pain. Denies any new symptoms. No new injury.  States he was told he needed to have a joint replacement by his orthopedist or that weight loss could help his pain. States he really wants to lose weight and requests help with this. States he has tried short term medication in the past but this only helped while he was on it. He gained the weight back quickly.   Osteoarthritis of knees- Dr. Cleophas Dunker. Takes naproxen as needed.   HTN- taking bystolic, lisinopril, furosemide. Reports good compliance and no side effects. Does not check BP at home.   GERD- taking omeprazole daily. Has tried to skip doses and states reflux is too bothersome.   Untreated OSA- he does not want to use a CPAP.   Eosinophils elevated in past. Needs rechecked.   Denies fever, chills, dizziness, chest pain, palpitations, shortness of breath, abdominal pain, N/V/D, urinary symptoms, LE edema.   Reviewed allergies, medications, past medical, surgical, family, and social history.   Review of Systems  Pertinent positives and negatives in the history of present illness.     Objective:   Physical Exam BP 110/70   Pulse 69   Wt (!) 303 lb 12.8 oz (137.8 kg)   BMI 47.58 kg/m   Alert and oriented an in no acute distress. Not otherwise examined.      Assessment & Plan:  Chronic pain of both knees  Essential hypertension - Plan: CBC with Differential/Platelet, Comprehensive metabolic panel  OSA (obstructive sleep apnea)  Gastroesophageal reflux disease, esophagitis presence not specified  Morbid obesity (HCC) - Plan: TSH, Lipid panel, Hemoglobin A1c, Amb Ref to Medical Weight Management  Eosinophilia - Plan: CBC with Differential/Platelet  Elevated LDL  cholesterol level - Plan: Lipid panel  He appears to be motivated to lose weight.  He is aware that this could potentially help with chronic knee pain.  He is also been advised by Dr. Cleophas Dunker, his orthopedist, that he needs joint replacement.  He does not want surgery at this time.  He has no new symptoms today.  Discussed options for weight loss.  He would like to be on medication to help curb his appetite.  Discussed that short-term weight loss medication is not a good idea for him and he has failed this in the past. Discussed that Medicare does not pay for weight loss medication and he reports finances are tight. He declines the possibility of a research study for weight loss. I will refer him to the [facility] management center. His blood pressure is under good control today.  He will continue on current medication regimen.  I will check labs.  He does have a history of eosinophilia and we will recheck this. He is aware that OSA untreated can lead to potential worsening health conditions including uncontrolled hypertension, heart disease. GERD-taking daily omeprazole.  Consider referral back to GI Check fasting lipids.  History of elevated LDL.  he is not currently on a statin. Check hemoglobin A1c today.  If he is prediabetic or diabetic, I think he would be a good candidate for Victoza or a GLP-1 to help with weight loss He will return for Medicare annual wellness visit in the next 2  to 3 months.

## 2018-04-17 LAB — CBC WITH DIFFERENTIAL/PLATELET
BASOS ABS: 0.1 10*3/uL (ref 0.0–0.2)
BASOS: 1 %
EOS (ABSOLUTE): 0.9 10*3/uL — ABNORMAL HIGH (ref 0.0–0.4)
Eos: 13 %
HEMOGLOBIN: 13.1 g/dL (ref 13.0–17.7)
Hematocrit: 40.3 % (ref 37.5–51.0)
IMMATURE GRANS (ABS): 0 10*3/uL (ref 0.0–0.1)
Immature Granulocytes: 0 %
LYMPHS: 20 %
Lymphocytes Absolute: 1.4 10*3/uL (ref 0.7–3.1)
MCH: 30.3 pg (ref 26.6–33.0)
MCHC: 32.5 g/dL (ref 31.5–35.7)
MCV: 93 fL (ref 79–97)
MONOCYTES: 4 %
Monocytes Absolute: 0.3 10*3/uL (ref 0.1–0.9)
NEUTROS ABS: 4.5 10*3/uL (ref 1.4–7.0)
Neutrophils: 62 %
Platelets: 259 10*3/uL (ref 150–450)
RBC: 4.33 x10E6/uL (ref 4.14–5.80)
RDW: 15 % (ref 12.3–15.4)
WBC: 7.1 10*3/uL (ref 3.4–10.8)

## 2018-04-17 LAB — COMPREHENSIVE METABOLIC PANEL
ALBUMIN: 3.8 g/dL (ref 3.5–5.5)
ALT: 13 IU/L (ref 0–44)
AST: 18 IU/L (ref 0–40)
Albumin/Globulin Ratio: 1.4 (ref 1.2–2.2)
Alkaline Phosphatase: 44 IU/L (ref 39–117)
BUN / CREAT RATIO: 17 (ref 9–20)
BUN: 18 mg/dL (ref 6–24)
Bilirubin Total: 0.3 mg/dL (ref 0.0–1.2)
CALCIUM: 9.4 mg/dL (ref 8.7–10.2)
CO2: 24 mmol/L (ref 20–29)
CREATININE: 1.04 mg/dL (ref 0.76–1.27)
Chloride: 102 mmol/L (ref 96–106)
GFR, EST AFRICAN AMERICAN: 92 mL/min/{1.73_m2} (ref >59)
GFR, EST NON AFRICAN AMERICAN: 79 mL/min/{1.73_m2} (ref >59)
GLUCOSE: 77 mg/dL (ref 65–99)
Globulin, Total: 2.7 g/dL (ref 1.5–4.5)
Potassium: 4.5 mmol/L (ref 3.5–5.2)
Sodium: 141 mmol/L (ref 134–144)
TOTAL PROTEIN: 6.5 g/dL (ref 6.0–8.5)

## 2018-04-17 LAB — LIPID PANEL
CHOL/HDL RATIO: 3.5 ratio (ref 0.0–5.0)
Cholesterol, Total: 193 mg/dL (ref 100–199)
HDL: 55 mg/dL (ref >39)
LDL CALC: 118 mg/dL — AB (ref 0–99)
Triglycerides: 99 mg/dL (ref 0–149)
VLDL CHOLESTEROL CAL: 20 mg/dL (ref 5–40)

## 2018-04-17 LAB — TSH: TSH: 0.729 u[IU]/mL (ref 0.450–4.500)

## 2018-04-17 LAB — HEMOGLOBIN A1C
Est. average glucose Bld gHb Est-mCnc: 111 mg/dL
HEMOGLOBIN A1C: 5.5 % (ref 4.8–5.6)

## 2018-04-21 ENCOUNTER — Other Ambulatory Visit: Payer: Self-pay | Admitting: Family Medicine

## 2018-04-21 DIAGNOSIS — D72829 Elevated white blood cell count, unspecified: Secondary | ICD-10-CM

## 2018-04-22 ENCOUNTER — Other Ambulatory Visit: Payer: Self-pay | Admitting: Family Medicine

## 2018-04-22 DIAGNOSIS — G4733 Obstructive sleep apnea (adult) (pediatric): Secondary | ICD-10-CM | POA: Diagnosis not present

## 2018-04-22 DIAGNOSIS — E876 Hypokalemia: Secondary | ICD-10-CM

## 2018-04-22 DIAGNOSIS — I10 Essential (primary) hypertension: Secondary | ICD-10-CM

## 2018-05-22 ENCOUNTER — Other Ambulatory Visit: Payer: Self-pay | Admitting: Family Medicine

## 2018-05-22 DIAGNOSIS — I10 Essential (primary) hypertension: Secondary | ICD-10-CM

## 2018-05-23 DIAGNOSIS — G4733 Obstructive sleep apnea (adult) (pediatric): Secondary | ICD-10-CM | POA: Diagnosis not present

## 2018-06-16 NOTE — Progress Notes (Signed)
[name] is a 57 y.o. male who presents for annual wellness visit and follow-up on chronic medical conditions.  He has the following concerns:  ?CHF in 2011. Saw cardiology at that time but no follow up since.  Reports having a sharp pain in his left chest 2-3 times per day at random times. Feels like needles sticking him for a few seconds then it resolves spontaneously. No exertional chest pain however he does not exercise. No shortness of breath.  States father died of MI at age 86.  OSA- untreated. Reports he could not afford to keep the CPAP and did not like to use it.   Reports having decreased sexual performance for the past 3-4 , stamina is decreased and takes him longer to get an erection.  No urinary changes. Normal stream and emptying.    HTN- does not check BP at home. Reports good medication compliance. No side effects.   Asthma since child. No issues since 2009.  No Hx of allergies.   GERD- taking omeprazole daily.   History of eosinophilia. Unexplained.    Immunization History  Administered Date(s) Administered  . Influenza-Unspecified 07/01/2011, 07/19/2012  . Pneumococcal Polysaccharide-23 11/03/2011  . Td 11/03/2011  . Tdap 02/16/2014   Last colonoscopy: 2014 with Dr. [name] along with a internal sphincterotomy.  Last PSA:  Unknown  Dentist: years ago  Ophtho: 4 years ago  Exercise: none  Other doctors caring for patient include: OrthoCleophas Dunker- as needed   Depression screen:  See questionnaire below.     Depression screen [facility] 2/9 06/17/2018 08/13/2017  Decreased Interest 0 0  Down, Depressed, Hopeless 0 0  PHQ - 2 Score 0 0    Fall Screen: See Questionaire below.   Fall Risk  06/17/2018  Falls in the past year? No    ADL screen:  See questionnaire below.  Functional Status Survey: Is the patient deaf or have difficulty hearing?: No Does the patient have difficulty seeing, even when wearing glasses/contacts?: No Does the patient have  difficulty concentrating, remembering, or making decisions?: No Does the patient have difficulty walking or climbing stairs?: No Does the patient have difficulty dressing or bathing?: No Does the patient have difficulty doing errands alone such as visiting a doctor's office or shopping?: No   End of Life Discussion:  Patient does not have a living will and medical power of attorney. MOST form filled out with patient. Forms given and discussed.    Review of Systems  Constitutional: -fever, -chills, -sweats, -unexpected weight change, -anorexia, -fatigue Allergy: -sneezing, -itching, -congestion Dermatology: denies changing moles, rash, lumps, new worrisome lesions ENT: -runny nose, -ear pain, -sore throat, -hoarseness, -sinus pain, -teeth pain, -tinnitus, -hearing loss, -epistaxis Cardiology:  +chest pain, -palpitations, -edema, -orthopnea, -paroxysmal nocturnal dyspnea Respiratory: -cough, -shortness of breath, -dyspnea on exertion, -wheezing, -hemoptysis Gastroenterology: -abdominal pain, -nausea, -vomiting, -diarrhea, -constipation, -blood in stool, -changes in bowel movement, -dysphagia Hematology: -bleeding or bruising problems Musculoskeletal: -arthralgias, -myalgias, -joint swelling, -back pain, -neck pain, -cramping, -gait changes Ophthalmology: -vision changes, -eye redness, -itching, -discharge Urology: -dysuria, -difficulty urinating, -hematuria, -urinary frequency, -urgency, incontinence Neurology: -headache, -weakness, -tingling, -numbness, -speech abnormality, -memory loss, -falls, -dizziness Psychology:  -depressed mood, -agitation, -sleep problems   PHYSICAL EXAM:  BP 126/90   Pulse 65   Ht 5\' 7"  (1.702 m)   Wt 299 lb 12.8 oz (136 kg)   BMI 46.96 kg/m   General Appearance: Alert, cooperative, no distress, appears stated age Head: Normocephalic, without obvious abnormality, atraumatic  Eyes: PERRL, conjunctiva/corneas clear, EOM's intact, fundi benign Ears: Normal  TM's and external ear canals Nose: Nares normal, mucosa normal, no drainage or sinus tenderness Throat: Lips, mucosa, and tongue normal; teeth missing gums normal Neck: Supple, no lymphadenopathy, thyroid:no enlargement/tenderness/nodules; no carotid bruit or JVD Back: Spine nontender, no curvature, ROM normal, no CVA tenderness Lungs: Clear to auscultation bilaterally without wheezes, rales or ronchi; respirations unlabored Chest Wall: No tenderness or deformity Heart: Regular rate and rhythm, S1 and S2 normal, no murmur, rub or gallop Breast Exam: No chest wall tenderness or masses  Abdomen: Soft, non-tender, nondistended, normoactive bowel sounds Genitalia: refuses  Rectal: refuses . Extremities: No clubbing, cyanosis or edema Pulses: 2+ and symmetric all extremities Skin: Skin color, texture, turgor normal, no rashes or lesions Lymph nodes: Cervical, supraclavicular, and axillary nodes normal Neurologic: CNII-XII intact, normal strength, sensation and gait; reflexes 2+ and symmetric throughout   Psych: Normal mood, affect, hygiene and grooming  ASSESSMENT/PLAN: Medicare annual wellness visit, subsequent - Plan: POCT Urinalysis DIP (Proadvantage Device)  Essential hypertension - Plan: CBC with Differential/Platelet, Comprehensive metabolic panel, Ambulatory referral to Cardiology  Congestive heart failure, unspecified HF chronicity, unspecified heart failure type (HCC) - Plan: EKG 12-Lead, Ambulatory referral to Cardiology  OSA (obstructive sleep apnea) - Plan: Ambulatory referral to Cardiology  Morbid obesity (HCC) - Plan: Ambulatory referral to Cardiology  Eosinophilia - Plan: CBC with Differential/Platelet  Gastroesophageal reflux disease, esophagitis presence not specified  Screen for STD (sexually transmitted disease) - Plan: RPR, GC/Chlamydia Probe Amp, HIV Antibody (routine testing w rflx)  Need for hepatitis C screening test - Plan: Hepatitis C antibody  Erectile  dysfunction, unspecified erectile dysfunction type  Routine general medical examination at a health care facility - Plan: CBC with Differential/Platelet, Comprehensive metabolic panel  Screening for prostate cancer - Plan: PSA  Mild intermittent asthma without complication  Family history of heart disease in male family member before age 52 - Plan: Ambulatory referral to Cardiology  HTN- BP close to goal. Continue medications.  Intermittent chest discomfort and ? History of CHF along with father having an MI at age 67- refer to cardiology Morbid obesity- he agrees to schedule with [facility] weight management and has an appointment with them.  OSA- untreated and does not want to use CPAP. Discussed potential health consequences.  GERD- taking omeprazole daily. Discussed lifestyle management and encouraged him to try to cut back on usage.  Asthma- denies flare since 2009 Requests STD screening. He is sexually active and reports decreased stamina. No medication prescribed today.  PSA done.  Advance directives discussed.  Spent at least 25 minutes face to face with patient for chronic health conditions and 50% or more was in counseling and coordination of care.    Discussed PSA screening (risks/benefits), recommended at least 30 minutes of aerobic activity at least 5 days/week; proper sunscreen use reviewed; healthy diet and alcohol recommendations (less than or equal to 2 drinks/day) reviewed; regular seatbelt use; changing batteries in smoke detectors. Immunization recommendations discussed.  Colonoscopy recommendations reviewed.   Medicare Attestation I have personally reviewed: The patient's medical and social history Their use of alcohol, tobacco or illicit drugs Their current medications and supplements The patient's functional ability including ADLs,fall risks, home safety risks, cognitive, and hearing and visual impairment Diet and physical activities Evidence for depression or mood  disorders  The patient's weight, height, and BMI have been recorded in the chart.  I have made referrals, counseling, and provided education to the patient based  on review of the above and I have provided the patient with a written personalized care plan for preventive services.     Hetty Blend, NP-C   06/17/2018

## 2018-06-17 ENCOUNTER — Ambulatory Visit (INDEPENDENT_AMBULATORY_CARE_PROVIDER_SITE_OTHER): Payer: Medicare HMO | Admitting: Family Medicine

## 2018-06-17 ENCOUNTER — Encounter: Payer: Self-pay | Admitting: Family Medicine

## 2018-06-17 VITALS — BP 126/90 | HR 65 | Ht 67.0 in | Wt 299.8 lb

## 2018-06-17 DIAGNOSIS — G4733 Obstructive sleep apnea (adult) (pediatric): Secondary | ICD-10-CM | POA: Diagnosis not present

## 2018-06-17 DIAGNOSIS — Z113 Encounter for screening for infections with a predominantly sexual mode of transmission: Secondary | ICD-10-CM

## 2018-06-17 DIAGNOSIS — I509 Heart failure, unspecified: Secondary | ICD-10-CM

## 2018-06-17 DIAGNOSIS — Z1159 Encounter for screening for other viral diseases: Secondary | ICD-10-CM

## 2018-06-17 DIAGNOSIS — Z125 Encounter for screening for malignant neoplasm of prostate: Secondary | ICD-10-CM | POA: Diagnosis not present

## 2018-06-17 DIAGNOSIS — Z0000 Encounter for general adult medical examination without abnormal findings: Secondary | ICD-10-CM | POA: Diagnosis not present

## 2018-06-17 DIAGNOSIS — N529 Male erectile dysfunction, unspecified: Secondary | ICD-10-CM | POA: Insufficient documentation

## 2018-06-17 DIAGNOSIS — I10 Essential (primary) hypertension: Secondary | ICD-10-CM | POA: Diagnosis not present

## 2018-06-17 DIAGNOSIS — Z8249 Family history of ischemic heart disease and other diseases of the circulatory system: Secondary | ICD-10-CM | POA: Diagnosis not present

## 2018-06-17 DIAGNOSIS — K219 Gastro-esophageal reflux disease without esophagitis: Secondary | ICD-10-CM | POA: Diagnosis not present

## 2018-06-17 DIAGNOSIS — J4520 Mild intermittent asthma, uncomplicated: Secondary | ICD-10-CM

## 2018-06-17 DIAGNOSIS — D721 Eosinophilia, unspecified: Secondary | ICD-10-CM

## 2018-06-17 DIAGNOSIS — J45909 Unspecified asthma, uncomplicated: Secondary | ICD-10-CM | POA: Insufficient documentation

## 2018-06-17 LAB — POCT URINALYSIS DIP (PROADVANTAGE DEVICE)
Bilirubin, UA: NEGATIVE
GLUCOSE UA: NEGATIVE mg/dL
Ketones, POC UA: NEGATIVE mg/dL
Leukocytes, UA: NEGATIVE
NITRITE UA: NEGATIVE
PROTEIN UA: NEGATIVE mg/dL
RBC UA: NEGATIVE
SPECIFIC GRAVITY, URINE: 1.01
UUROB: NEGATIVE
pH, UA: 8 (ref 5.0–8.0)

## 2018-06-18 ENCOUNTER — Other Ambulatory Visit: Payer: Self-pay | Admitting: Family Medicine

## 2018-06-18 DIAGNOSIS — K219 Gastro-esophageal reflux disease without esophagitis: Secondary | ICD-10-CM

## 2018-06-18 LAB — CBC WITH DIFFERENTIAL/PLATELET
Basophils Absolute: 0.1 10*3/uL (ref 0.0–0.2)
Basos: 1 %
EOS (ABSOLUTE): 0.8 10*3/uL — AB (ref 0.0–0.4)
Eos: 10 %
HEMATOCRIT: 43.2 % (ref 37.5–51.0)
Hemoglobin: 14.3 g/dL (ref 13.0–17.7)
IMMATURE GRANULOCYTES: 0 %
Immature Grans (Abs): 0 10*3/uL (ref 0.0–0.1)
LYMPHS ABS: 1.4 10*3/uL (ref 0.7–3.1)
Lymphs: 16 %
MCH: 30.6 pg (ref 26.6–33.0)
MCHC: 33.1 g/dL (ref 31.5–35.7)
MCV: 92 fL (ref 79–97)
MONOS ABS: 0.4 10*3/uL (ref 0.1–0.9)
Monocytes: 5 %
NEUTROS PCT: 68 %
Neutrophils Absolute: 5.9 10*3/uL (ref 1.4–7.0)
PLATELETS: 271 10*3/uL (ref 150–450)
RBC: 4.68 x10E6/uL (ref 4.14–5.80)
RDW: 14.9 % (ref 12.3–15.4)
WBC: 8.7 10*3/uL (ref 3.4–10.8)

## 2018-06-18 LAB — HEPATITIS C ANTIBODY: Hep C Virus Ab: <0.1 s/co ratio (ref 0.0–0.9)

## 2018-06-18 LAB — COMPREHENSIVE METABOLIC PANEL
A/G RATIO: 1.4 (ref 1.2–2.2)
ALK PHOS: 46 IU/L (ref 39–117)
ALT: 11 IU/L (ref 0–44)
AST: 18 IU/L (ref 0–40)
Albumin: 3.9 g/dL (ref 3.5–5.5)
BILIRUBIN TOTAL: 0.3 mg/dL (ref 0.0–1.2)
BUN/Creatinine Ratio: 15 (ref 9–20)
BUN: 18 mg/dL (ref 6–24)
CALCIUM: 9.4 mg/dL (ref 8.7–10.2)
CHLORIDE: 98 mmol/L (ref 96–106)
CO2: 30 mmol/L — ABNORMAL HIGH (ref 20–29)
Creatinine, Ser: 1.18 mg/dL (ref 0.76–1.27)
GFR calc Af Amer: 79 mL/min/{1.73_m2} (ref >59)
GFR, EST NON AFRICAN AMERICAN: 68 mL/min/{1.73_m2} (ref >59)
GLOBULIN, TOTAL: 2.7 g/dL (ref 1.5–4.5)
Glucose: 76 mg/dL (ref 65–99)
POTASSIUM: 4.6 mmol/L (ref 3.5–5.2)
SODIUM: 138 mmol/L (ref 134–144)
Total Protein: 6.6 g/dL (ref 6.0–8.5)

## 2018-06-18 LAB — HIV ANTIBODY (ROUTINE TESTING W REFLEX): HIV Screen 4th Generation wRfx: NONREACTIVE

## 2018-06-18 LAB — RPR: RPR: NONREACTIVE

## 2018-06-18 LAB — PSA: Prostate Specific Ag, Serum: 0.7 ng/mL (ref 0.0–4.0)

## 2018-06-19 LAB — GC/CHLAMYDIA PROBE AMP
Chlamydia trachomatis, NAA: NEGATIVE
NEISSERIA GONORRHOEAE BY PCR: NEGATIVE

## 2018-06-22 DIAGNOSIS — G4733 Obstructive sleep apnea (adult) (pediatric): Secondary | ICD-10-CM | POA: Diagnosis not present

## 2018-07-23 DIAGNOSIS — G4733 Obstructive sleep apnea (adult) (pediatric): Secondary | ICD-10-CM | POA: Diagnosis not present

## 2018-07-24 ENCOUNTER — Other Ambulatory Visit: Payer: Self-pay | Admitting: Family Medicine

## 2018-07-24 DIAGNOSIS — E876 Hypokalemia: Secondary | ICD-10-CM

## 2018-07-24 DIAGNOSIS — I10 Essential (primary) hypertension: Secondary | ICD-10-CM

## 2018-07-26 ENCOUNTER — Encounter: Payer: Self-pay | Admitting: *Deleted

## 2018-07-26 ENCOUNTER — Ambulatory Visit (INDEPENDENT_AMBULATORY_CARE_PROVIDER_SITE_OTHER): Payer: Medicare HMO | Admitting: Cardiology

## 2018-07-26 ENCOUNTER — Encounter: Payer: Self-pay | Admitting: Cardiology

## 2018-07-26 VITALS — BP 126/86 | HR 66 | Ht 67.0 in | Wt 306.0 lb

## 2018-07-26 DIAGNOSIS — R079 Chest pain, unspecified: Secondary | ICD-10-CM

## 2018-07-26 DIAGNOSIS — I10 Essential (primary) hypertension: Secondary | ICD-10-CM

## 2018-07-26 NOTE — Progress Notes (Signed)
Cardiology Office Note:    Date:  07/26/2018   ID:  [name], DOB 1961/07/19, MRN [id number]  PCP:  Avanell Shackleton, NP-C  Cardiologist:  No primary care provider on file.  Electrophysiologist:  None   Referring MD: Avanell Shackleton, NP-C     History of Present Illness:    [name] is a 57 y.o. male here for evaluation of sharp pain left chest. Needle. Strtch yawn. Non exertions. COPD. Non smoker. OSA.   Father died MI at 67.   No DM.   Past Medical History:  Diagnosis Date  . Asthma    mild, no inhaler used  . CHF (congestive heart failure) (HCC)   . DJD (degenerative joint disease)    has taken tramadol 50mg  q8h in past  . GERD (gastroesophageal reflux disease)   . Hyperlipidemia   . Hypertension   . Morbid obesity (HCC)    weighed in excess of 500 lbs at one point  . OSA (obstructive sleep apnea)    did not tolerate CPAP, uses nasal cannula  . Proteinuria    GFR 94.12 February 2015    Past Surgical History:  Procedure Laterality Date  . COLONOSCOPY N/A 04/22/2013   Procedure: COLONOSCOPY;  Surgeon: 04/24/2013, MD;  Location: [facility];  Service: General;  Laterality: N/A;  . leg surgery for fracture Right 2008 or 2009  . SPHINCTEROTOMY Left 04/22/2013   Procedure: Exam under anesthesia, left lateral internal SPHINCTEROTOMY;  Surgeon: 04/24/2013, MD;  Location: [facility];  Service: General;  Laterality: Left;    Current Medications: Current Meds  Medication Sig  . aspirin 81 MG tablet Take 81 mg by mouth daily.  Kandis Cocking BYSTOLIC 10 MG tablet TAKE 1 TABLET BY MOUTH EVERY DAY IN THE MORNING  . cyclobenzaprine (FLEXERIL) 5 MG tablet TAKE 1 TABLET BY MOUTH THREE TIMES A DAY AS NEEDED FOR MUSCLE SPASMS  . furosemide (LASIX) 80 MG tablet TAKE 1/2 TABLET BY MOUTH TWICE A DAY  . KLOR-CON M20 20 MEQ tablet TAKE 1 TABLET BY MOUTH EVERY DAY  . KLOR-CON M20 20 MEQ tablet TAKE 1 TABLET BY MOUTH EVERY DAY  . lisinopril (PRINIVIL,ZESTRIL) 10 MG tablet TAKE 1 TABLET BY  MOUTH EVERY DAY  . naproxen sodium (ALEVE) 220 MG tablet Take 1 tablet (220 mg total) by mouth 2 (two) times daily with a meal.  . omeprazole (PRILOSEC) 20 MG capsule TAKE ONE CAPSULE BY MOUTH EVERY DAY     Allergies:   Tomato   Social History   Socioeconomic History  . Marital status: Single    Spouse name: Not on file  . Number of children: Not on file  . Years of education: Not on file  . Highest education level: Not on file  Occupational History  . Not on file  Social Needs  . Financial resource strain: Not on file  . Food insecurity:    Worry: Not on file    Inability: Not on file  . Transportation needs:    Medical: Not on file    Non-medical: Not on file  Tobacco Use  . Smoking status: Former Smoker    Packs/day: 0.20    Years: 1.50    Pack years: 0.30    Types: Cigarettes    Last attempt to quit: 1982    Years since quitting: 37.9  . Smokeless tobacco: Never Used  Substance and Sexual Activity  . Alcohol use: Yes    Alcohol/week: 3.0 standard  drinks    Types: 3 Standard drinks or equivalent per week    Comment: 3 drinks of rum per week  . Drug use: No  . Sexual activity: Not on file  Lifestyle  . Physical activity:    Days per week: Not on file    Minutes per session: Not on file  . Stress: Not on file  Relationships  . Social connections:    Talks on phone: Not on file    Gets together: Not on file    Attends religious service: Not on file    Active member of club or organization: Not on file    Attends meetings of clubs or organizations: Not on file    Relationship status: Not on file  Other Topics Concern  . Not on file  Social History Narrative  . Not on file     Family History: The patient's family history includes Cancer in his mother; Heart disease in his father; Hypertension in his mother.  ROS:   Please see the history of present illness.    Denies any fevers chills nausea vomiting syncope bleeding.  He does snore.  All other systems  reviewed and are negative.  EKGs/Labs/Other Studies Reviewed:    The following studies were reviewed today: Prior office note EKG lab work  EKG:  EKG is not ordered today.  Prior EKG from 06/17/2018 personally reviewed and interpreted shows sinus rhythm with nonspecific ST-T wave changes.  Recent Labs: 04/16/2018: TSH 0.729 06/17/2018: ALT 11; BUN 18; Creatinine, Ser 1.18; Hemoglobin 14.3; Platelets 271; Potassium 4.6; Sodium 138  Recent Lipid Panel    Component Value Date/Time   CHOL 193 04/16/2018 1508   TRIG 99 04/16/2018 1508   HDL 55 04/16/2018 1508   CHOLHDL 3.5 04/16/2018 1508   CHOLHDL 3.0 05/19/2017 0923   VLDL 27 11/07/2009 0257   LDLCALC 118 (H) 04/16/2018 1508   LDLCALC 108 (H) 05/19/2017 0923    Physical Exam:    VS:  BP 126/86   Pulse 66   Ht 5\' 7"  (1.702 m)   Wt (!) 306 lb (138.8 kg)   SpO2 99%   BMI 47.93 kg/m     Wt Readings from Last 3 Encounters:  07/26/18 (!) 306 lb (138.8 kg)  06/17/18 299 lb 12.8 oz (136 kg)  04/16/18 (!) 303 lb 12.8 oz (137.8 kg)     GEN: Obese well nourished, well developed in no acute distress HEENT: Normal NECK: No JVD; No carotid bruits LYMPHATICS: No lymphadenopathy CARDIAC: RRR, no murmurs, rubs, gallops RESPIRATORY:  Clear to auscultation without rales, wheezing or rhonchi  ABDOMEN: Soft, non-tender, non-distended MUSCULOSKELETAL:  No edema; No deformity  SKIN: Warm and dry NEUROLOGIC:  Alert and oriented x 3 PSYCHIATRIC:  Normal affect   ASSESSMENT:    1. Chest pain, unspecified type   2. Essential hypertension   3. Morbid obesity (HCC)    PLAN:    In order of problems listed above:  Atypical chest pain - He is unable to walk on the treadmill.  We will go ahead and plan on a pharmacologic stress test, Lexiscan nuclear.  Hopefully we will be able to utilize D-SPECT camera to improve clarity given his body habitus. - I described to him that the symptoms most likely are musculoskeletal in etiology.  However  given his strong family history of CAD with his father having an MI at age 17 and dying we will check this out further.  Morbid obesity -Continue to encourage weight  loss, decrease carbohydrates.   Medication Adjustments/Labs and Tests Ordered: Current medicines are reviewed at length with the patient today.  Concerns regarding medicines are outlined above.  Orders Placed This Encounter  Procedures  . MYOCARDIAL PERFUSION IMAGING   No orders of the defined types were placed in this encounter.   Patient Instructions  Medication Instructions:  The current medical regimen is effective;  continue present plan and medications.  Testing/Procedures: Your physician has requested that you have a lexiscan myoview. For further information please visit [email or web address] Please follow instruction sheet, as given.  Follow-Up: Follow up will be determined based on the results of the above.  Thank you for choosing [facility]!!         Signed, Donato Schultz, MD  07/26/2018 5:09 PM    [facility]

## 2018-07-26 NOTE — Patient Instructions (Signed)
Medication Instructions:  The current medical regimen is effective;  continue present plan and medications.  Testing/Procedures: Your physician has requested that you have a lexiscan myoview. For further information please visit [email or web address] Please follow instruction sheet, as given.  Follow-Up: Follow up will be determined based on the results of the above.  Thank you for choosing [facility]!!

## 2018-08-16 ENCOUNTER — Telehealth (HOSPITAL_COMMUNITY): Payer: Self-pay | Admitting: *Deleted

## 2018-08-16 NOTE — Telephone Encounter (Signed)
Patient given detailed instructions per Myocardial Perfusion Study Information Sheet for the test on 08/18/18 at 1315. Patient notified to arrive 15 minutes early and that it is imperative to arrive on time for appointment to keep from having the test rescheduled.  If you need to cancel or reschedule your appointment, please call the office within 24 hours of your appointment. . Patient verbalized understanding.[name], Adelene Idler

## 2018-08-18 ENCOUNTER — Other Ambulatory Visit: Payer: Self-pay | Admitting: Family Medicine

## 2018-08-18 ENCOUNTER — Ambulatory Visit (HOSPITAL_COMMUNITY): Payer: Medicare HMO | Attending: Cardiology

## 2018-08-18 DIAGNOSIS — I10 Essential (primary) hypertension: Secondary | ICD-10-CM | POA: Insufficient documentation

## 2018-08-18 DIAGNOSIS — R079 Chest pain, unspecified: Secondary | ICD-10-CM | POA: Diagnosis not present

## 2018-08-18 MED ORDER — REGADENOSON 0.4 MG/5ML IV SOLN
0.4000 mg | Freq: Once | INTRAVENOUS | Status: AC
  Administered 2018-08-18: 0.4 mg via INTRAVENOUS

## 2018-08-18 MED ORDER — TECHNETIUM TC 99M TETROFOSMIN IV KIT
32.4000 | PACK | Freq: Once | INTRAVENOUS | Status: AC | PRN
  Administered 2018-08-18: 32.4 via INTRAVENOUS
  Filled 2018-08-18: qty 33

## 2018-08-19 ENCOUNTER — Ambulatory Visit (HOSPITAL_COMMUNITY): Payer: Medicare HMO | Attending: Internal Medicine

## 2018-08-19 LAB — MYOCARDIAL PERFUSION IMAGING
CHL CUP NUCLEAR SDS: 2
CHL CUP NUCLEAR SRS: 1
LV dias vol: 141 mL (ref 62–150)
LV sys vol: 66 mL
NUC STRESS TID: 1.06
Peak HR: 88 {beats}/min
Rest HR: 61 {beats}/min
SSS: 3

## 2018-08-19 MED ORDER — TECHNETIUM TC 99M TETROFOSMIN IV KIT
30.5000 | PACK | Freq: Once | INTRAVENOUS | Status: AC | PRN
  Administered 2018-08-19: 30.5 via INTRAVENOUS
  Filled 2018-08-19: qty 31

## 2018-08-22 DIAGNOSIS — G4733 Obstructive sleep apnea (adult) (pediatric): Secondary | ICD-10-CM | POA: Diagnosis not present

## 2018-08-23 ENCOUNTER — Telehealth: Payer: Self-pay

## 2018-08-23 DIAGNOSIS — R079 Chest pain, unspecified: Secondary | ICD-10-CM

## 2018-08-23 NOTE — Telephone Encounter (Signed)
Notes recorded by Sigurd Sos, RN on 08/23/2018 at 9:53 AM EST [id number] 12/16 ------

## 2018-08-23 NOTE — Telephone Encounter (Signed)
-----   Message from Mark C Skains, MD sent at 08/20/2018  4:36 PM EST ----- Overall low risk NUC stress test. However, please order ECHO to evaluate cardiac size and pump function. Dx. Chest pain Mark Skains, MD  

## 2018-08-23 NOTE — Telephone Encounter (Signed)
-----   Message from Jake Bathe, MD sent at 08/20/2018  4:36 PM EST ----- Overall low risk NUC stress test. However, please order ECHO to evaluate cardiac size and pump function. Dx. Chest pain Donato Schultz, MD

## 2018-08-23 NOTE — Telephone Encounter (Signed)
Notes recorded by Sigurd Sos, RN on 08/23/2018 at 1:00 PM EST The patient has been notified of the result and verbalized understanding. I ordered the Echo. All questions (if any) were answered. Sigurd Sos, RN 08/23/2018 1:00 PM

## 2018-09-02 ENCOUNTER — Ambulatory Visit (HOSPITAL_COMMUNITY): Payer: Medicare HMO | Attending: Cardiovascular Disease

## 2018-09-02 ENCOUNTER — Other Ambulatory Visit: Payer: Self-pay

## 2018-09-02 DIAGNOSIS — R079 Chest pain, unspecified: Secondary | ICD-10-CM

## 2018-09-22 DIAGNOSIS — G4733 Obstructive sleep apnea (adult) (pediatric): Secondary | ICD-10-CM | POA: Diagnosis not present

## 2018-10-23 ENCOUNTER — Other Ambulatory Visit: Payer: Self-pay | Admitting: Family Medicine

## 2018-10-23 DIAGNOSIS — I10 Essential (primary) hypertension: Secondary | ICD-10-CM

## 2018-10-23 DIAGNOSIS — E876 Hypokalemia: Secondary | ICD-10-CM

## 2018-10-25 NOTE — Telephone Encounter (Signed)
I am ok with refilling the bystolic but he is on a rather high dose of the furosemide (40 mg twice daily). I would prefer his cardiologist approve this or we try to reduce his dose if his BP tolerates. Is he checking his BP at home?

## 2018-10-25 NOTE — Telephone Encounter (Signed)
Is this ok to refill?  

## 2018-10-25 NOTE — Telephone Encounter (Signed)
He should continue on potassium depending on the dose of Lasix and will need to have a BMP to determine if the potassium dose is appropriate. Have him let me know if his cardiologist recommends staying on his current regimen. I am ok managing this for him if so but we should check labs in the next month and see him in the office.

## 2018-10-25 NOTE — Telephone Encounter (Signed)
Pt was notified and will call Dr. Anne Fu office to see if they will refill the lasix. He has not been checking his bp at home. Its only being checked with going to a doctor's appt. Is it ok to refill potassium?

## 2018-10-25 NOTE — Telephone Encounter (Signed)
Pt states he will call cardiologist then call us back and to schedule with [place]. I will deny potassium med for now until I hear back

## 2018-10-29 ENCOUNTER — Other Ambulatory Visit: Payer: Self-pay | Admitting: Cardiology

## 2018-10-29 ENCOUNTER — Other Ambulatory Visit: Payer: Self-pay | Admitting: Family Medicine

## 2018-10-29 DIAGNOSIS — K219 Gastro-esophageal reflux disease without esophagitis: Secondary | ICD-10-CM

## 2018-10-29 DIAGNOSIS — I10 Essential (primary) hypertension: Secondary | ICD-10-CM

## 2018-10-29 DIAGNOSIS — E876 Hypokalemia: Secondary | ICD-10-CM

## 2018-10-29 MED ORDER — FUROSEMIDE 80 MG PO TABS: 40.0000 mg | ORAL_TABLET | Freq: Two times a day (BID) | ORAL | 0 refills | Status: DC

## 2018-10-29 NOTE — Telephone Encounter (Signed)
Pt was advised on 2/17 that he needed to contact cardiology about lasix and potassium and get back to [place]

## 2018-10-29 NOTE — Telephone Encounter (Signed)
°*  STAT* If patient is at the pharmacy, call can be transferred to refill team.   1. Which medications need to be refilled? (please list name of each medication and dose if know) furosemide 80 mg  2. Which pharmacy/location (including street and city if local pharmacy) is medication to be sent to? [facility]  3. Do they need a 30 day or 90 day supply? 30

## 2018-11-01 ENCOUNTER — Other Ambulatory Visit: Payer: Self-pay | Admitting: Family Medicine

## 2018-11-01 DIAGNOSIS — E876 Hypokalemia: Secondary | ICD-10-CM

## 2018-11-02 ENCOUNTER — Ambulatory Visit (INDEPENDENT_AMBULATORY_CARE_PROVIDER_SITE_OTHER): Payer: Medicare HMO | Admitting: Family Medicine

## 2018-11-02 ENCOUNTER — Encounter: Payer: Self-pay | Admitting: Family Medicine

## 2018-11-02 VITALS — BP 120/88 | HR 67 | Wt 310.4 lb

## 2018-11-02 DIAGNOSIS — E876 Hypokalemia: Secondary | ICD-10-CM | POA: Diagnosis not present

## 2018-11-02 DIAGNOSIS — N529 Male erectile dysfunction, unspecified: Secondary | ICD-10-CM

## 2018-11-02 DIAGNOSIS — I10 Essential (primary) hypertension: Secondary | ICD-10-CM

## 2018-11-02 MED ORDER — SILDENAFIL CITRATE 20 MG PO TABS: ORAL_TABLET | ORAL | 0 refills | Status: DC

## 2018-11-02 NOTE — Progress Notes (Signed)
   Subjective:    Patient ID: [name], male    DOB: 1961/04/24, 58 y.o.   MRN: [id number]  HPI Chief Complaint  Patient presents with  . needs potassium check    potassium needs checked    He is here to follow up on hypokalemia history and use of high dose Lasix.  Refilled potassium supplements. Denies fever, chills, dizziness, chest pain, palpitations, shortness of breath, abdominal pain, nausea, vomiting, diarrhea, lower extremity edema.  Requests Viagra for erectile dysfunction.  States he has desire but issues getting and maintaining an erection. He is not on nitroglycerin.  Recent evaluation by his cardiologist.  Reviewed allergies, medications, past medical, surgical, family, and social history.    Review of Systems Pertinent positives and negatives in the history of present illness.     Objective:   Physical Exam BP 120/88   Pulse 67   Wt (!) 310 lb 6.4 oz (140.8 kg)   BMI 48.62 kg/m   Alert and in no distress. Pharyngeal area is normal. Neck is supple without adenopathy or thyromegaly. Cardiac exam shows a regular rhythm without murmurs or gallops. Lungs are clear to auscultation.       Assessment & Plan:  Essential hypertension - Plan: Basic metabolic panel  Hypokalemia - Plan: Basic metabolic panel  Erectile dysfunction, unspecified erectile dysfunction type - Plan: sildenafil (REVATIO) 20 MG tablet  He does not check his blood pressure at home.  I did encourage him to start checking it.  Discussed that his diastolic is elevated today.  Recent evaluation by cardiology.  He is taking high-dose Lasix.  Refilled potassium and will recheck a BMP today.  History of hypokalemia. ED-discussed proper administration and potential side effects of sildenafil.  He verbalized understanding.

## 2018-11-02 NOTE — Patient Instructions (Signed)
Dr. Alice Reichert [phone]

## 2018-11-03 LAB — BASIC METABOLIC PANEL
BUN/Creatinine Ratio: 15 (ref 9–20)
BUN: 18 mg/dL (ref 6–24)
CO2: 23 mmol/L (ref 20–29)
Calcium: 9.4 mg/dL (ref 8.7–10.2)
Chloride: 101 mmol/L (ref 96–106)
Creatinine, Ser: 1.22 mg/dL (ref 0.76–1.27)
GFR calc Af Amer: 76 mL/min/{1.73_m2} (ref >59)
GFR calc non Af Amer: 65 mL/min/{1.73_m2} (ref >59)
Glucose: 77 mg/dL (ref 65–99)
Potassium: 4.5 mmol/L (ref 3.5–5.2)
Sodium: 136 mmol/L (ref 134–144)

## 2018-11-04 ENCOUNTER — Other Ambulatory Visit: Payer: Self-pay | Admitting: Internal Medicine

## 2018-11-04 DIAGNOSIS — E876 Hypokalemia: Secondary | ICD-10-CM

## 2018-11-04 MED ORDER — POTASSIUM CHLORIDE CRYS ER 20 MEQ PO TBCR: 20.0000 meq | EXTENDED_RELEASE_TABLET | Freq: Every day | ORAL | 5 refills | Status: DC

## 2018-11-11 ENCOUNTER — Ambulatory Visit (INDEPENDENT_AMBULATORY_CARE_PROVIDER_SITE_OTHER): Payer: Medicare HMO | Admitting: Orthopaedic Surgery

## 2018-11-11 ENCOUNTER — Encounter (INDEPENDENT_AMBULATORY_CARE_PROVIDER_SITE_OTHER): Payer: Self-pay | Admitting: Orthopaedic Surgery

## 2018-11-11 DIAGNOSIS — M170 Bilateral primary osteoarthritis of knee: Secondary | ICD-10-CM | POA: Diagnosis not present

## 2018-11-11 DIAGNOSIS — Z6841 Body Mass Index (BMI) 40.0 and over, adult: Secondary | ICD-10-CM | POA: Insufficient documentation

## 2018-11-11 MED ORDER — METHYLPREDNISOLONE ACETATE 40 MG/ML IJ SUSP
80.0000 mg | INTRAMUSCULAR | Status: AC | PRN
  Administered 2018-11-11: 80 mg via INTRA_ARTICULAR

## 2018-11-11 MED ORDER — BUPIVACAINE HCL 0.5 % IJ SOLN
2.0000 mL | INTRAMUSCULAR | Status: AC | PRN
  Administered 2018-11-11: 2 mL via INTRA_ARTICULAR

## 2018-11-11 MED ORDER — LIDOCAINE HCL 1 % IJ SOLN
2.0000 mL | INTRAMUSCULAR | Status: AC | PRN
  Administered 2018-11-11: 2 mL

## 2018-11-11 NOTE — Progress Notes (Signed)
Office Visit Note   Patient: [name]           Date of Birth: 27-Dec-1960           MRN: [id number] Visit Date: 11/11/2018              Requested by: Avanell Shackleton, NP-C [id number] [place], [place] [address] PCP: Avanell Shackleton, NP-C   Assessment & Plan: Visit Diagnoses:  1. BMI 45.0-49.9, adult (HCC)   2. Bilateral primary osteoarthritis of knee     Plan: Osteoarthritis left knee.  Has had prior cortisone injections with at least temporary relief.  Prior films have demonstrated significant bilateral knee osteoarthritis.  Long discussion regarding diagnosis and treatment options are what he can expect over time.  His BMI is still over 45.  Will inject the left knee with cortisone today and see back as needed Follow-Up Instructions: Return if symptoms worsen or fail to improve.   Orders:  Orders Placed This Encounter  Procedures  . Large Joint Inj: L knee   No orders of the defined types were placed in this encounter.     Procedures: Large Joint Inj: L knee on 11/11/2018 2:59 PM Indications: pain and diagnostic evaluation Details: 25 G 1.5 in needle, anteromedial approach  Arthrogram: No  Medications: 2 mL lidocaine 1 %; 2 mL bupivacaine 0.5 %; 80 mg methylPREDNISolone acetate 40 MG/ML Procedure, treatment alternatives, risks and benefits explained, specific risks discussed. Consent was given by the patient. Patient was prepped and draped in the usual sterile fashion.       Clinical Data: No additional findings.   Subjective: Chief Complaint  Patient presents with  . Left Knee - Pain  Patient presents with left knee pain. He was last here in January of 2019 and received a cortisone injection. Patient said that it did not last even a month. Patient states that his pain has worsened in the last year. He is not taking anything for pain. He said that the pain has become constant in the last 3 weeks. The pain is on the anterior and posterior side of his  knee. The pain radiates down into his calf.   HPI  Review of Systems   Objective: Vital Signs: There were no vitals taken for this visit.  Physical Exam Constitutional:      Appearance: He is well-developed.  Eyes:     Pupils: Pupils are equal, round, and reactive to light.  Pulmonary:     Effort: Pulmonary effort is normal.  Skin:    General: Skin is warm and dry.  Neurological:     Mental Status: He is alert and oriented to person, place, and time.  Psychiatric:        Behavior: Behavior normal.     Ortho Exam awake alert and oriented x3.  Comfortable sitting.  Minimal effusion left knee.  The knee was not particularly warm or hot.  No instability.  Predominately medial joint pain.  Some patellar crepitation.  Full extension flexed about 102 or 3 degrees.  Specialty Comments:  No specialty comments available.  Imaging: No results found.   PMFS History: Patient Active Problem List   Diagnosis Date Noted  . BMI 45.0-49.9, adult (HCC) 11/11/2018  . Bilateral primary osteoarthritis of knee 11/11/2018  . Family history of heart disease in male family member before age 58 06/17/2018  . Erectile dysfunction 06/17/2018  . Asthma   . Eosinophilia 04/16/2018  . HTN (hypertension) 04/23/2016  .  Esophageal reflux 04/23/2016  . Morbid obesity (HCC) 04/23/2016  . Hypokalemia 04/23/2016  . OSA (obstructive sleep apnea) 04/23/2016  . Personal history of noncompliance with medical treatment, presenting hazards to health 04/23/2016  . History of proteinuria syndrome 04/23/2016  . History of erectile dysfunction 04/23/2016  . History of pneumonia 04/23/2016  . CHF (congestive heart failure) (HCC) 04/23/2016  . Rectal fissure 03/24/2013  . Hemorrhoid 08/18/2012   Past Medical History:  Diagnosis Date  . Asthma    mild, no inhaler used  . CHF (congestive heart failure) (HCC)   . DJD (degenerative joint disease)    has taken tramadol 50mg  q8h in past  . GERD  (gastroesophageal reflux disease)   . Hyperlipidemia   . Hypertension   . Morbid obesity (HCC)    weighed in excess of 500 lbs at one point  . OSA (obstructive sleep apnea)    did not tolerate CPAP, uses nasal cannula  . Proteinuria    GFR 94.12 February 2015    Family History  Problem Relation Age of Onset  . Heart disease Father        MI, DM2, HTN  . Hypertension Mother        died at 77 DM2, HTN  . Cancer Mother     Past Surgical History:  Procedure Laterality Date  . COLONOSCOPY N/A 04/22/2013   Procedure: COLONOSCOPY;  Surgeon: Kandis Cocking, MD;  Location: [facility];  Service: General;  Laterality: N/A;  . leg surgery for fracture Right 2008 or 2009  . SPHINCTEROTOMY Left 04/22/2013   Procedure: Exam under anesthesia, left lateral internal SPHINCTEROTOMY;  Surgeon: Kandis Cocking, MD;  Location: [facility];  Service: General;  Laterality: Left;   Social History   Occupational History  . Not on file  Tobacco Use  . Smoking status: Former Smoker    Packs/day: 0.20    Years: 1.50    Pack years: 0.30    Types: Cigarettes    Last attempt to quit: 1982    Years since quitting: 38.2  . Smokeless tobacco: Never Used  Substance and Sexual Activity  . Alcohol use: Yes    Alcohol/week: 3.0 standard drinks    Types: 3 Standard drinks or equivalent per week    Comment: 3 drinks of rum per week  . Drug use: No  . Sexual activity: Not on file

## 2018-11-15 ENCOUNTER — Telehealth: Payer: Self-pay | Admitting: Family Medicine

## 2018-11-15 NOTE — Telephone Encounter (Signed)
P.A. SILDENAFIL  

## 2019-01-18 ENCOUNTER — Other Ambulatory Visit: Payer: Self-pay | Admitting: Family Medicine

## 2019-01-18 DIAGNOSIS — I10 Essential (primary) hypertension: Secondary | ICD-10-CM

## 2019-01-22 ENCOUNTER — Other Ambulatory Visit: Payer: Self-pay | Admitting: Cardiology

## 2019-01-22 ENCOUNTER — Other Ambulatory Visit: Payer: Self-pay | Admitting: Family Medicine

## 2019-01-22 DIAGNOSIS — I10 Essential (primary) hypertension: Secondary | ICD-10-CM

## 2019-01-22 DIAGNOSIS — K219 Gastro-esophageal reflux disease without esophagitis: Secondary | ICD-10-CM

## 2019-01-22 NOTE — Telephone Encounter (Signed)
P.A. denied, ED meds are exclusion with Medicare.  Called pt and informed of Good Rx discount card and [facility]

## 2019-03-02 ENCOUNTER — Encounter: Payer: Self-pay | Admitting: Family Medicine

## 2019-03-02 ENCOUNTER — Other Ambulatory Visit: Payer: Self-pay

## 2019-03-02 ENCOUNTER — Ambulatory Visit (INDEPENDENT_AMBULATORY_CARE_PROVIDER_SITE_OTHER): Payer: Medicare HMO | Admitting: Family Medicine

## 2019-03-02 VITALS — BP 140/90 | HR 71 | Temp 98.6°F | Wt 313.8 lb

## 2019-03-02 DIAGNOSIS — I10 Essential (primary) hypertension: Secondary | ICD-10-CM

## 2019-03-02 DIAGNOSIS — R1084 Generalized abdominal pain: Secondary | ICD-10-CM

## 2019-03-02 DIAGNOSIS — D721 Eosinophilia, unspecified: Secondary | ICD-10-CM

## 2019-03-02 DIAGNOSIS — K219 Gastro-esophageal reflux disease without esophagitis: Secondary | ICD-10-CM | POA: Diagnosis not present

## 2019-03-02 LAB — CBC WITH DIFFERENTIAL/PLATELET

## 2019-03-02 NOTE — Progress Notes (Signed)
   Subjective:    Patient ID: [name], male    DOB: December 20, 1960, 58 y.o.   MRN: [id number]  HPI Chief Complaint  Patient presents with  . abdominal pain    abdominal pain for about 3 weeks. all over stomach.    Here with complaints of generalized abdominal pain for the past 3 weeks. States pain feels like a burning sensation and "tight". States he felt bloated and his pain was worst 3 days ago. Pain has been gradually improving since.  Reports normal bowel movements, every other day which is him usual schedule. He takes 2 stool softeners and one cap full of Miralax per day to keep stools regular.   States eating helps the pain. He has noticed certain foods making his pain worse. Milk often makes his symptoms worse.   Denies fever, chills, weight loss, night sweats, vomiting, early satiety. No chest pain, palpitations, shortness of breath, cough, back pain, urinary symptoms, LE edema.   Up to date with Cologuard.   States he has not taken his BP medication today.   Reviewed allergies, medications, past medical, surgical, family, and social history.   Review of Systems Pertinent positives and negatives in the history of present illness.     Objective:   Physical Exam Constitutional:      General: He is not in acute distress.    Appearance: He is obese. He is not ill-appearing.  Eyes:     Conjunctiva/sclera: Conjunctivae normal.  Cardiovascular:     Rate and Rhythm: Normal rate and regular rhythm.     Pulses: Normal pulses.  Pulmonary:     Effort: Pulmonary effort is normal.     Breath sounds: Normal breath sounds.  Abdominal:     General: Bowel sounds are normal. There is no distension.     Palpations: Abdomen is soft.     Tenderness: There is no abdominal tenderness. There is no guarding or rebound.  Skin:    General: Skin is warm and dry.     Capillary Refill: Capillary refill takes less than 2 seconds.  Neurological:     Mental Status: He is alert.    BP  140/90   Pulse 71   Temp 98.6 F (37 C) (Oral)   Wt (!) 313 lb 12.8 oz (142.3 kg)   SpO2 97%   BMI 49.15 kg/m        Assessment & Plan:  Generalized abdominal pain - Plan: CBC with Differential/Platelet, Comprehensive metabolic panel, Lipase, Amylase. No red flag symptoms. Exam is benign. Symptoms gradually improving. Pain may be food related since he has noticed dairy worsening his pain. He will follow up if any new or worsening symptoms arise. Discussed red flag symptoms and when to seek immediate medical care.   Morbid obesity ([place]) - Plan: advised that he has gained weight. Diet is non discretionary and he does not exercise.   Gastroesophageal reflux disease, esophagitis presence not specified - Plan: he will stop omeprazole and try Dexilant 60 mg daily. I gave him samples for the next 10 days.   Essential hypertension - Plan: CBC with Differential/Platelet, Comprehensive metabolic panel. He has not taken his medication today. Advised his BP is elevated. He should check this at home.   Eosinophilia - Plan: CBC with Differential/Platelet. Recheck and monitor.   Cologuard up to date.  Consider GI referral if symptoms are worsening or not resolving in 10 days. Follow up virtually or in person.

## 2019-03-02 NOTE — Patient Instructions (Signed)
Stop the omeprazole and try taking the Dexilant samples.  This is once daily.  Try to look for any food or beverage triggers.  Anything that makes her symptoms worse or better and keep a record of this. If you develop fever, chills, severe pain, vomiting or any other worrisome symptoms then call.  As long as you are not getting worse or improving then we will follow-up in 10 days.  This can be a virtual visit if you are getting better or in the office if you do not notice significant improvement.  Your blood pressure today is 140/90.  This is most likely since you have not taking your blood pressure medication.  We will call you with your lab results.     Food Choices for Gastroesophageal Reflux Disease, Adult When you have gastroesophageal reflux disease (GERD), the foods you eat and your eating habits are very important. Choosing the right foods can help ease your discomfort. Think about working with a nutrition specialist (dietitian) to help you make good choices. What are tips for following this plan?  Meals  Choose healthy foods that are low in fat, such as fruits, vegetables, whole grains, low-fat dairy products, and lean meat, fish, and poultry.  Eat small meals often instead of 3 large meals a day. Eat your meals slowly, and in a place where you are relaxed. Avoid bending over or lying down until 2-3 hours after eating.  Avoid eating meals 2-3 hours before bed.  Avoid drinking a lot of liquid with meals.  Cook foods using methods other than frying. Bake, grill, or broil food instead.  Avoid or limit: ? Chocolate. ? Peppermint or spearmint. ? Alcohol. ? Pepper. ? Black and decaffeinated coffee. ? Black and decaffeinated tea. ? Bubbly (carbonated) soft drinks. ? Caffeinated energy drinks and soft drinks.  Limit high-fat foods such as: ? Fatty meat or fried foods. ? Whole milk, cream, butter, or ice cream. ? Nuts and nut butters. ? Pastries, donuts, and sweets made  with butter or shortening.  Avoid foods that cause symptoms. These foods may be different for everyone. Common foods that cause symptoms include: ? Tomatoes. ? Oranges, lemons, and limes. ? Peppers. ? Spicy food. ? Onions and garlic. ? Vinegar. Lifestyle  Maintain a healthy weight. Ask your doctor what weight is healthy for you. If you need to lose weight, work with your doctor to do so safely.  Exercise for at least 30 minutes for 5 or more days each week, or as told by your doctor.  Wear loose-fitting clothes.  Do not smoke. If you need help quitting, ask your doctor.  Sleep with the head of your bed higher than your feet. Use a wedge under the mattress or blocks under the bed frame to raise the head of the bed. Summary  When you have gastroesophageal reflux disease (GERD), food and lifestyle choices are very important in easing your symptoms.  Eat small meals often instead of 3 large meals a day. Eat your meals slowly, and in a place where you are relaxed.  Limit high-fat foods such as fatty meat or fried foods.  Avoid bending over or lying down until 2-3 hours after eating.  Avoid peppermint and spearmint, caffeine, alcohol, and chocolate. This information is not intended to replace advice given to you by your health care provider. Make sure you discuss any questions you have with your health care provider. Document Released: 02/24/2012 Document Revised: 09/30/2016 Document Reviewed: 09/30/2016 Elsevier Interactive Patient Education  2019 [place].

## 2019-03-03 LAB — COMPREHENSIVE METABOLIC PANEL
ALT: 14 IU/L (ref 0–44)
AST: 19 IU/L (ref 0–40)
Albumin/Globulin Ratio: 1.6 (ref 1.2–2.2)
Albumin: 4.1 g/dL (ref 3.8–4.9)
Alkaline Phosphatase: 47 IU/L (ref 39–117)
BUN/Creatinine Ratio: 16 (ref 9–20)
BUN: 18 mg/dL (ref 6–24)
Bilirubin Total: 0.3 mg/dL (ref 0.0–1.2)
CO2: 24 mmol/L (ref 20–29)
Calcium: 9.6 mg/dL (ref 8.7–10.2)
Chloride: 101 mmol/L (ref 96–106)
Creatinine, Ser: 1.16 mg/dL (ref 0.76–1.27)
GFR calc Af Amer: 80 mL/min/{1.73_m2} (ref >59)
GFR calc non Af Amer: 70 mL/min/{1.73_m2} (ref >59)
Globulin, Total: 2.6 g/dL (ref 1.5–4.5)
Glucose: 81 mg/dL (ref 65–99)
Potassium: 4.3 mmol/L (ref 3.5–5.2)
Sodium: 140 mmol/L (ref 134–144)
Total Protein: 6.7 g/dL (ref 6.0–8.5)

## 2019-03-03 LAB — CBC WITH DIFFERENTIAL/PLATELET
Basophils Absolute: 0.1 10*3/uL (ref 0.0–0.2)
Basos: 1 %
EOS (ABSOLUTE): 0.9 10*3/uL — ABNORMAL HIGH (ref 0.0–0.4)
Eos: 12 %
Hematocrit: 43.9 % (ref 37.5–51.0)
Hemoglobin: 14.3 g/dL (ref 13.0–17.7)
Immature Grans (Abs): 0 10*3/uL (ref 0.0–0.1)
Immature Granulocytes: 0 %
Lymphocytes Absolute: 1.2 10*3/uL (ref 0.7–3.1)
Lymphs: 17 %
MCH: 29.9 pg (ref 26.6–33.0)
MCHC: 32.6 g/dL (ref 31.5–35.7)
MCV: 92 fL (ref 79–97)
Monocytes Absolute: 0.4 10*3/uL (ref 0.1–0.9)
Monocytes: 6 %
Neutrophils Absolute: 4.4 10*3/uL (ref 1.4–7.0)
Neutrophils: 64 %
Platelets: 272 10*3/uL (ref 150–450)
RBC: 4.78 x10E6/uL (ref 4.14–5.80)
RDW: 14.3 % (ref 11.6–15.4)
WBC: 6.9 10*3/uL (ref 3.4–10.8)

## 2019-03-03 LAB — LIPASE: Lipase: 30 U/L (ref 13–78)

## 2019-03-03 LAB — AMYLASE: Amylase: 66 U/L (ref 31–110)

## 2019-03-21 ENCOUNTER — Telehealth: Payer: Self-pay | Admitting: Family Medicine

## 2019-03-21 DIAGNOSIS — R1084 Generalized abdominal pain: Secondary | ICD-10-CM

## 2019-03-21 DIAGNOSIS — K219 Gastro-esophageal reflux disease without esophagitis: Secondary | ICD-10-CM

## 2019-03-21 NOTE — Telephone Encounter (Signed)
I recommend that he see GI since he is not improving with the Dexilant. Please refer him if he does not have one for GERD, abdominal pain.

## 2019-03-21 NOTE — Telephone Encounter (Signed)
Pt called and said he is still having problems with his stomach and was told to let you know so you can see what the next steps would be

## 2019-03-21 NOTE — Telephone Encounter (Signed)
Sent referral to [facility]

## 2019-03-22 ENCOUNTER — Encounter: Payer: Self-pay | Admitting: Gastroenterology

## 2019-03-28 ENCOUNTER — Other Ambulatory Visit: Payer: Self-pay | Admitting: Family Medicine

## 2019-03-28 DIAGNOSIS — E876 Hypokalemia: Secondary | ICD-10-CM

## 2019-03-28 NOTE — Telephone Encounter (Signed)
Is this okay to refill? 

## 2019-04-08 ENCOUNTER — Encounter: Payer: Self-pay | Admitting: Medical

## 2019-04-08 ENCOUNTER — Other Ambulatory Visit: Payer: Self-pay

## 2019-04-08 ENCOUNTER — Ambulatory Visit
Admission: RE | Admit: 2019-04-08 | Discharge: 2019-04-08 | Disposition: A | Discharge status: Home / Self Care | Payer: Medicare HMO | Source: Ambulatory Visit | Attending: Medical | Admitting: Medical

## 2019-04-08 ENCOUNTER — Ambulatory Visit (INDEPENDENT_AMBULATORY_CARE_PROVIDER_SITE_OTHER): Payer: Medicare HMO | Admitting: Medical

## 2019-04-08 VITALS — BP 150/90 | HR 80 | Temp 98.4°F | Resp 18 | Wt 311.2 lb

## 2019-04-08 DIAGNOSIS — M5410 Radiculopathy, site unspecified: Secondary | ICD-10-CM

## 2019-04-08 DIAGNOSIS — M7918 Myalgia, other site: Secondary | ICD-10-CM

## 2019-04-08 DIAGNOSIS — M79605 Pain in left leg: Secondary | ICD-10-CM | POA: Diagnosis not present

## 2019-04-08 DIAGNOSIS — M47817 Spondylosis without myelopathy or radiculopathy, lumbosacral region: Secondary | ICD-10-CM | POA: Diagnosis not present

## 2019-04-08 MED ORDER — NAPROXEN 500 MG PO TABS: 500.0000 mg | ORAL_TABLET | Freq: Two times a day (BID) | ORAL | 0 refills | Status: DC

## 2019-04-08 MED ORDER — HYDROCODONE-ACETAMINOPHEN 7.5-325 MG PO TABS: 1.0000 | ORAL_TABLET | Freq: Four times a day (QID) | ORAL | 0 refills | Status: AC | PRN

## 2019-04-08 NOTE — Progress Notes (Signed)
Subjective: Chief Complaint  Patient presents with  . left side butt    left hig/butt pain radiating down left foot.     Here for left sided buttock pain radiating down to left foot.   Started 2 days ago.  He denies injury, no fall, no trauma.  He has had intermittent back pain in the past but no similar severe pain like this.  He is disabled due to COPD.  He is not working.  He does not exercise in general on a regular basis.  He has a disabled brother who weighs 240 pounds who is paralyzed.  He has to help transfer and lift him up in the bed.  He denies feeling any weird pop or sensation no injury recently.   He used some Tylenol over-the-counter but nothing is helping.  He denies saddle anesthesia no fever no belly pain no blood in the stool or urine, no incontinence. No other aggravating or relieving factors. No other complaint.   Past Medical History:  Diagnosis Date  . Asthma    mild, no inhaler used  . CHF (congestive heart failure) (HCC)   . DJD (degenerative joint disease)    has taken tramadol 50mg  q8h in past  . GERD (gastroesophageal reflux disease)   . Hyperlipidemia   . Hypertension   . Morbid obesity (HCC)    weighed in excess of 500 lbs at one point  . OSA (obstructive sleep apnea)    did not tolerate CPAP, uses nasal cannula  . Proteinuria    GFR 94.12 February 2015   Current Outpatient Medications on File Prior to Visit  Medication Sig Dispense Refill  . aspirin 81 MG tablet Take 81 mg by mouth daily.    14 February 2015 BYSTOLIC 10 MG tablet TAKE 1 TABLET BY MOUTH EVERY DAY IN THE MORNING 90 tablet 0  . cyclobenzaprine (FLEXERIL) 5 MG tablet TAKE 1 TABLET BY MOUTH THREE TIMES A DAY AS NEEDED FOR MUSCLE SPASMS 20 tablet 0  . furosemide (LASIX) 80 MG tablet TAKE 0.5 TABS BY MOUTH 2 TIMES DAILY. PT NEEDS TO MAKE AN APPT WITH DR.FOR REFILLS, 1ST ATTEMPT 90 tablet 1  . KLOR-CON M20 20 MEQ tablet TAKE 1 TABLET BY MOUTH EVERY DAY 90 tablet 1  . naproxen sodium (ALEVE) 220 MG tablet Take 1  tablet (220 mg total) by mouth 2 (two) times daily with a meal. 18 tablet 0  . sildenafil (REVATIO) 20 MG tablet Take 1-5 tablets as need one hour before sexual activity. No more than 100 mg in 24 hours. 25 tablet 0  . omeprazole (PRILOSEC) 20 MG capsule TAKE 1 CAPSULE BY MOUTH EVERY DAY (Patient not taking: Reported on 04/08/2019) 90 capsule 0   No current facility-administered medications on file prior to visit.    ROS as in subjective    Objective BP (!) 150/90   Pulse 80   Temp 98.4 F (36.9 C) (Tympanic)   Resp 18   Wt (!) 311 lb 3.2 oz (141.2 kg)   SpO2 98%   BMI 48.74 kg/m   General obese African American male, leaning over to the right, seemingly in pain Tender in the left buttock sciatic region, otherwise nontender, no deformity of the back, no obvious scoliosis He seems to be in pain getting up out of the chair, seems to be in pain taking steps and walking mild pain with flexion and extension the pain worse taking steps.  Unable to do heel and toe walk due to the pain  Pain in the left leg with leg extension and resisted hip extension and flexion DTRs reduced in general Pulses 1-2+ upper and lower extremity Legs are moderate varicose veins bilaterally, calves nontender legs nontender with relatively normal range of motion of the hips and knees No lower extremity edema    Assessment: Encounter Diagnoses  Name Primary?  . Radicular pain of left lower extremity Yes  . Left buttock pain   . Left leg pain     Plan: We discussed the findings and symptoms.  I suspect sciatica flareup but cannot rule out ruptured disc.  We will send for lumbar x-ray.  We discussed the following recommendations:  Recommendation:  Begin Naprosyn twice daily with food for pain and inflammation  For worse pain, you can use Norco/Hydrocodone pain pill, 1 tablet every 6 hours.   alternate at least 4 hours with Naprosyn  Rest this weekend  You can prop the left leg up on a pillow  No  lifting over the weekend  Go for lumbar spine xray  Call back by Wednesday of next week to give [place] update on symptoms  If worse over the weekend including fever, several pain, numbness in genitals, incontinence, leg weakness causing you to fall, or any new unusual symptoms, then go to the hospital  Okey was seen today for left side butt.  Diagnoses and all orders for this visit:  Radicular pain of left lower extremity -     DG Lumbar Spine Complete; Future  Left buttock pain -     DG Lumbar Spine Complete; Future  Left leg pain -     DG Lumbar Spine Complete; Future  Other orders -     HYDROcodone-acetaminophen (NORCO) 7.5-325 MG tablet; Take 1 tablet by mouth every 6 (six) hours as needed for up to 5 days for moderate pain. -     naproxen (NAPROSYN) 500 MG tablet; Take 1 tablet (500 mg total) by mouth 2 (two) times daily with a meal.   F/u pending xray

## 2019-04-08 NOTE — Patient Instructions (Addendum)
Recommendation:  Begin Naprosyn twice daily with food for pain and inflammation  For worse pain, you can use Norco/Hydrocodone pain pill, 1 tablet every 6 hours.   alternate at least 4 hours with Naprosyn  Rest this weekend  You can prop the left leg up on a pillow  No lifting over the weekend  Go for lumbar spine xray  Call back by Wednesday of next week to give [place] update on symptoms  Over the weekend, if fever, several pain, numbness in genitals, incontinence, leg weakness causing you to fall, or any new unusual symptoms, then go to the hospital    Please go to [facility] for your spine xray.   Their hours are 8am - 4:30 pm Monday - Friday.  Take your insurance card with you.  [facility] [phone]  [address], [place], [place] [address]  [address], [place] [address]      Sciatica  Sciatica is pain, weakness, tingling, or loss of feeling (numbness) along the sciatic nerve. The sciatic nerve starts in the lower back and goes down the back of each leg. Sciatica usually goes away on its own or with treatment. Sometimes, sciatica may come back (recur). What are the causes? This condition happens when the sciatic nerve is pinched or has pressure put on it. This may be the result of:  A disk in between the bones of the spine bulging out too far (herniated disk).  Changes in the spinal disks that occur with aging.  A condition that affects a muscle in the butt.  Extra bone growth near the sciatic nerve.  A break (fracture) of the area between your hip bones (pelvis).  Pregnancy.  Tumor. This is rare. What increases the risk? You are more likely to develop this condition if you:  Play sports that put pressure or stress on the spine.  Have poor strength and ease of movement (flexibility).  Have had a back injury in the past.  Have had back surgery.  Sit for long periods of time.  Do activities that involve bending  or lifting over and over again.  Are very overweight (obese). What are the signs or symptoms? Symptoms can vary from mild to very bad. They may include:  Any of these problems in the lower back, leg, hip, or butt: ? Mild tingling, loss of feeling, or dull aches. ? Burning sensations. ? Sharp pains.  Loss of feeling in the back of the calf or the sole of the foot.  Leg weakness.  Very bad back pain that makes it hard to move. These symptoms may get worse when you cough, sneeze, or laugh. They may also get worse when you sit or stand for long periods of time. How is this treated? This condition often gets better without any treatment. However, treatment may include:  Changing or cutting back on physical activity when you have pain.  Doing exercises and stretching.  Putting ice or heat on the affected area.  Medicines that help: ? To relieve pain and swelling. ? To relax your muscles.  Shots (injections) of medicines that help to relieve pain, irritation, and swelling.  Surgery. Follow these instructions at home: Medicines  Take over-the-counter and prescription medicines only as told by your doctor.  Ask your doctor if the medicine prescribed to you: ? Requires you to avoid driving or using heavy machinery. ? Can cause trouble pooping (constipation). You may need to take these steps to prevent or  treat trouble pooping:  Drink enough fluids to keep your pee (urine) pale yellow.  Take over-the-counter or prescription medicines.  Eat foods that are high in fiber. These include beans, whole grains, and fresh fruits and vegetables.  Limit foods that are high in fat and sugar. These include fried or sweet foods. Managing pain      If told, put ice on the affected area. ? Put ice in a plastic bag. ? Place a towel between your skin and the bag. ? Leave the ice on for 20 minutes, 2-3 times a day.  If told, put heat on the affected area. Use the heat source that your  doctor tells you to use, such as a moist heat pack or a heating pad. ? Place a towel between your skin and the heat source. ? Leave the heat on for 20-30 minutes. ? Remove the heat if your skin turns bright red. This is very important if you are unable to feel pain, heat, or cold. You may have a greater risk of getting burned. Activity   Return to your normal activities as told by your doctor. Ask your doctor what activities are safe for you.  Avoid activities that make your symptoms worse.  Take short rests during the day. ? When you rest for a long time, do some physical activity or stretching between periods of rest. ? Avoid sitting for a long time without moving. Get up and move around at least one time each hour.  Exercise and stretch regularly, as told by your doctor.  Do not lift anything that is heavier than 10 lb (4.5 kg) while you have symptoms of sciatica. ? Avoid lifting heavy things even when you do not have symptoms. ? Avoid lifting heavy things over and over.  When you lift objects, always lift in a way that is safe for your body. To do this, you should: ? Bend your knees. ? Keep the object close to your body. ? Avoid twisting. General instructions  Stay at a healthy weight.  Wear comfortable shoes that support your feet. Avoid wearing high heels.  Avoid sleeping on a mattress that is too soft or too hard. You might have less pain if you sleep on a mattress that is firm enough to support your back.  Keep all follow-up visits as told by your doctor. This is important. Contact a doctor if:  You have pain that: ? Wakes you up when you are sleeping. ? Gets worse when you lie down. ? Is worse than the pain you have had in the past. ? Lasts longer than 4 weeks.  You lose weight without trying. Get help right away if:  You cannot control when you pee (urinate) or poop (have a bowel movement).  You have weakness in any of these areas and it gets worse: ? Lower  back. ? The area between your hip bones. ? Butt. ? Legs.  You have redness or swelling of your back.  You have a burning feeling when you pee. Summary  Sciatica is pain, weakness, tingling, or loss of feeling (numbness) along the sciatic nerve.  This condition happens when the sciatic nerve is pinched or has pressure put on it.  Sciatica can cause pain, tingling, or loss of feeling (numbness) in the lower back, legs, hips, and butt.  Treatment often includes rest, exercise, medicines, and putting ice or heat on the affected area. This information is not intended to replace advice given to you by your health  care provider. Make sure you discuss any questions you have with your health care provider. Document Released: 06/03/2008 Document Revised: 09/13/2018 Document Reviewed: 09/13/2018 Elsevier Patient Education  2020 [employer].

## 2019-04-12 ENCOUNTER — Other Ambulatory Visit: Payer: Self-pay | Admitting: Family Medicine

## 2019-04-12 ENCOUNTER — Other Ambulatory Visit: Payer: Self-pay

## 2019-04-12 DIAGNOSIS — M5410 Radiculopathy, site unspecified: Secondary | ICD-10-CM

## 2019-04-12 DIAGNOSIS — I10 Essential (primary) hypertension: Secondary | ICD-10-CM

## 2019-04-14 ENCOUNTER — Other Ambulatory Visit: Payer: Self-pay

## 2019-04-14 ENCOUNTER — Encounter: Payer: Self-pay | Admitting: Orthopaedic Surgery

## 2019-04-14 ENCOUNTER — Ambulatory Visit (INDEPENDENT_AMBULATORY_CARE_PROVIDER_SITE_OTHER): Payer: Medicare HMO | Admitting: Orthopaedic Surgery

## 2019-04-14 VITALS — BP 168/99 | HR 66 | Ht 68.5 in | Wt 300.0 lb

## 2019-04-14 DIAGNOSIS — M545 Low back pain, unspecified: Secondary | ICD-10-CM | POA: Insufficient documentation

## 2019-04-14 MED ORDER — METHYLPREDNISOLONE 4 MG PO TBPK: ORAL_TABLET | ORAL | 0 refills | Status: DC

## 2019-04-14 NOTE — Progress Notes (Signed)
Office Visit Note   Patient: [name]           Date of Birth: [birth year]           MRN: [id number] Visit Date: 04/14/2019              Requested by: Jac Canavan, PA-C [address] [place],  [place] [address] PCP: Avanell Shackleton, NP-C   Assessment & Plan: Visit Diagnoses:  1. Acute left-sided low back pain, unspecified whether sciatica present     Plan: Acute onset of low back pain associated with left lower extremity radiculopathy about 10 days ago.  Feels a little better but still having numbness as far distally as his left foot.  Taking Naprosyn for pain.  Will obtain MRI scan of lumbar spine and try Medrol Dosepak.  Stop the Naprosyn.  Not sure if he has a ruptured disc but certainly has radiculopathy and possibly some weakness of his left foot  Follow-Up Instructions: Return after MRI L-S spine.   Orders:  Orders Placed This Encounter  Procedures   MR Lumbar Spine w/o contrast   Meds ordered this encounter  Medications   methylPREDNISolone (MEDROL DOSEPAK) 4 MG TBPK tablet    Sig: Take as directed on package.    Dispense:  21 tablet    Refill:  0      Procedures: No procedures performed   Clinical Data: No additional findings.   Subjective: Chief Complaint  Patient presents with   Lower Back - Pain  Patient presents today for lower back pain and left foot numbness. He said that it has been happening for 10days. No known injury. His pain radiates from his left buttock and down his left leg. The pain is constant, but gets worse upon getting up from resting. Then numbness is constant as well. He has noticed that he has some difficulty lifting his left leg, but not always. He is taking Naproxen for pain. He saw his PCP on July 31st and x-rays performed the same day. The x-rays are in the PACS system.  Not diabetic Films were reviewed on the PACS system.  There is diffuse calcification of the anterior longitudinal ligament with some bridging  osteophytes throughout the thoracic spine.  He has significant degenerative disc disease particularly at L4-5 and L5-S1 with diffuse facet sclerosis.  No listhesis.  No significant scoliosis  HPI  Review of Systems   Objective: Vital Signs: BP (!) 168/99    Pulse 66    Ht 5' 8.5" (1.74 m)    Wt 300 lb (136.1 kg)    BMI 44.95 kg/m   Physical Exam Constitutional:      Appearance: He is well-developed.  Eyes:     Pupils: Pupils are equal, round, and reactive to light.  Pulmonary:     Effort: Pulmonary effort is normal.  Skin:    General: Skin is warm and dry.  Neurological:     Mental Status: He is alert and oriented to person, place, and time.  Psychiatric:        Behavior: Behavior normal.     Ortho Exam BMI 45.  Straight leg raise minimally positive for posterior left thigh pain negative on the right.  Left knee reflex was decreased compared to the right.  Ankle reflexes depressed but symmetrical.  Does have some altered sensibility in his left foot and I thought some weakness with dorsiflexion of his left foot compared to his right.  Painless range of motion  of both hips.  No percussible tenderness of lumbar spine or sacroiliac joints  Specialty Comments:  No specialty comments available.  Imaging: No results found.   PMFS History: Patient Active Problem List   Diagnosis Date Noted   Low back pain radiating to left leg 04/14/2019   BMI 45.0-49.9, adult ([facility]) 11/11/2018   Bilateral primary osteoarthritis of knee 11/11/2018   Family history of heart disease in male family member before age 20 06/17/2018   Erectile dysfunction 06/17/2018   Asthma    Eosinophilia 04/16/2018   HTN (hypertension) 04/23/2016   Esophageal reflux 04/23/2016   Morbid obesity ([facility]) 04/23/2016   Hypokalemia 04/23/2016   OSA (obstructive sleep apnea) 04/23/2016   Personal history of noncompliance with medical treatment, presenting hazards to health 04/23/2016   History of  proteinuria syndrome 04/23/2016   History of erectile dysfunction 04/23/2016   History of pneumonia 04/23/2016   CHF (congestive heart failure) ([facility]) 04/23/2016   Rectal fissure 03/24/2013   Hemorrhoid 08/18/2012   Past Medical History:  Diagnosis Date   Asthma    mild, no inhaler used   CHF (congestive heart failure) ([facility])    DJD (degenerative joint disease)    has taken tramadol 50mg  q8h in past   GERD (gastroesophageal reflux disease)    Hyperlipidemia    Hypertension    Morbid obesity ([facility])    weighed in excess of 500 lbs at one point   OSA (obstructive sleep apnea)    did not tolerate CPAP, uses nasal cannula   Proteinuria    GFR 94.12 February 2015    Family History  Problem Relation Age of Onset   Heart disease Father        MI, DM2, HTN   Hypertension Mother        died at 26 DM2, HTN   Cancer Mother     Past Surgical History:  Procedure Laterality Date   COLONOSCOPY N/A 04/22/2013   Procedure: COLONOSCOPY;  Surgeon: Shann Medal, MD;  Location: [facility];  Service: General;  Laterality: N/A;   leg surgery for fracture Right 2008 or 2009   SPHINCTEROTOMY Left 04/22/2013   Procedure: Exam under anesthesia, left lateral internal SPHINCTEROTOMY;  Surgeon: Shann Medal, MD;  Location: [facility];  Service: General;  Laterality: Left;   Social History   Occupational History   Not on file  Tobacco Use   Smoking status: Former Smoker    Packs/day: 0.20    Years: 1.50    Pack years: 0.30    Types: Cigarettes    Quit date: 1982    Years since quitting: 38.6   Smokeless tobacco: Never Used  Substance and Sexual Activity   Alcohol use: Yes    Alcohol/week: 3.0 standard drinks    Types: 3 Standard drinks or equivalent per week    Comment: 3 drinks of rum per week   Drug use: No   Sexual activity: Not on file

## 2019-04-17 ENCOUNTER — Other Ambulatory Visit: Payer: Self-pay | Admitting: Family Medicine

## 2019-04-17 DIAGNOSIS — K219 Gastro-esophageal reflux disease without esophagitis: Secondary | ICD-10-CM

## 2019-04-20 ENCOUNTER — Telehealth: Payer: Self-pay | Admitting: Orthopaedic Surgery

## 2019-04-20 NOTE — Telephone Encounter (Signed)
Called patient and reiterated that it can take awhile to get authorization from insurance.  No answer. Left message on machine.

## 2019-04-20 NOTE — Telephone Encounter (Signed)
Patient called checking on the status of his MRI referral.  Patient requested a return call.

## 2019-04-20 NOTE — Telephone Encounter (Signed)
Pt has been approved thru [place] and no PA was required thru Medicaid, order has been sent to [facility]

## 2019-04-21 ENCOUNTER — Other Ambulatory Visit: Payer: Medicare HMO

## 2019-04-21 ENCOUNTER — Encounter: Payer: Self-pay | Admitting: Gastroenterology

## 2019-04-21 ENCOUNTER — Telehealth: Payer: Self-pay | Admitting: Orthopaedic Surgery

## 2019-04-21 ENCOUNTER — Ambulatory Visit: Payer: Medicare HMO | Admitting: Gastroenterology

## 2019-04-21 ENCOUNTER — Other Ambulatory Visit: Payer: Self-pay

## 2019-04-21 ENCOUNTER — Ambulatory Visit (INDEPENDENT_AMBULATORY_CARE_PROVIDER_SITE_OTHER)
Admission: RE | Admit: 2019-04-21 | Discharge: 2019-04-21 | Disposition: A | Discharge status: Home / Self Care | Payer: Medicare HMO | Source: Ambulatory Visit | Attending: Gastroenterology | Admitting: Gastroenterology

## 2019-04-21 VITALS — BP 132/78 | HR 72 | Temp 98.1°F | Ht 69.0 in | Wt 315.0 lb

## 2019-04-21 DIAGNOSIS — R140 Abdominal distension (gaseous): Secondary | ICD-10-CM

## 2019-04-21 DIAGNOSIS — R1084 Generalized abdominal pain: Secondary | ICD-10-CM | POA: Diagnosis not present

## 2019-04-21 DIAGNOSIS — F1010 Alcohol abuse, uncomplicated: Secondary | ICD-10-CM | POA: Diagnosis not present

## 2019-04-21 DIAGNOSIS — F1090 Alcohol use, unspecified, uncomplicated: Secondary | ICD-10-CM

## 2019-04-21 DIAGNOSIS — R12 Heartburn: Secondary | ICD-10-CM | POA: Diagnosis not present

## 2019-04-21 DIAGNOSIS — Z7289 Other problems related to lifestyle: Secondary | ICD-10-CM

## 2019-04-21 DIAGNOSIS — R109 Unspecified abdominal pain: Secondary | ICD-10-CM | POA: Diagnosis not present

## 2019-04-21 DIAGNOSIS — Z789 Other specified health status: Secondary | ICD-10-CM

## 2019-04-21 NOTE — Telephone Encounter (Signed)
Called and relayed this to patient via voicemail.

## 2019-04-21 NOTE — Telephone Encounter (Signed)
Patient called checking the status of his MRI.  Patient is requesting a return call.

## 2019-04-21 NOTE — Telephone Encounter (Signed)
Spoke with patient. He is going to call [facility] for scheduling.

## 2019-04-21 NOTE — Progress Notes (Signed)
MAC stop  [place] VISIT   Primary Care Provider [name], NP-C [place] [address] [phone]  Referring Provider [name], NP-C [place],  [place] [address] [phone]  Patient Profile: [name] is a 58 y.o. male with a pmh significant for CHFpEF, obesity, hypertension, hyperlipidemia, sleep apnea, diverticulosis, prior anal fissure status post treatment), alcohol use, GERD.  The patient presents to the [facility] for an evaluation and management of problem(s) noted below:  Problem List 1. Bloating   2. Generalized abdominal pain   3. Pyrosis   4. Mild alcohol use disorder     History of Present Illness This is the patient's first visit to the outpatient [facility] clinic.  The patient states that for approximately 5 weeks or potentially longer that he has been experiencing issues of a bloating sensation and abdominal burning sensation throughout his abdomen.  When asking more specifically the abdominal discomfort begins around his umbilicus subsequently moves outward.  He has described postprandial discomfort up to 20 to 30 minutes after eating even if he was eating bland foods.  At times he will experience pyrosis as a result of the types of foods that he is eaten.  For him the biggest issue over the last few weeks has been the bloating sensation itself and feeling more significant abdominal distention.  The patient had been on a PPI, based on a notation it looks like it was Dexilant, and it is not clear that made a difference or helped the patient.  The patient has a longstanding routine of drinking alcohol on a daily basis.  He normally will drink between 4 shots of rum on a daily basis over the course of a few hours.  However over the last few months he had been drinking a little bit more.  He relates that he may be drinking up to 6-8 shots of rum on a daily basis with his  mixed drinks.  The patient noted this and wanted to see if it would make a difference if he decreased his intake and over the course the last week he is now doing better without discomfort present to the extent as it had previously since he has decreased his alcohol intake.  Describes a normal bowel movement daily and has no blood in his stools.  After review of his chart and after discussion with the patient he also relays a history of a anal fissure that required treatment.  He had a colonoscopy in 2014 with notation of diverticulosis and he underwent 2 interventions 1 in 2016 and one in 2014 to attempt treatment of his anal fissure.  He describes no recurrence of his symptoms.  He takes a stool softener as needed.  There is no family history of GI cancers.  He is finding it very stressful over the course of the last few months but really has been dealing with things for years.  He takes care of his elderly mother as well as his brother who is hemiplegic and is status post bilateral BKA's as a result of underlying vascular disease.  He is their sole caretaker and does the best he can to help.  These last few months have been even more stressful than before.  He wonders if stress could be causing him some issues as well.  Does not use naproxen more recently and finished a recent Medrol pack for issues of arthritis has been seen by orthopedics.  He has  never had an upper endoscopy.  GI Review of Systems Positive as above Negative for dysphagia, odynophagia, early satiety (currently), change in bowel habits, melena, hematochezia, urgency  Review of Systems General: Denies fevers/chills/weight loss HEENT: Denies oral lesions Cardiovascular: Denies chest pain/palpitations Pulmonary: Denies shortness of breath/cough Gastroenterological: See HPI Genitourinary: Denies darkened urine Hematological: Denies easy bruising/bleeding Endocrine: Denies temperature intolerance Dermatological: Denies  jaundice Psychological: Mood is stable though relays significance stressors at home   Medications Current Outpatient Medications  Medication Sig Dispense Refill   aspirin 81 MG tablet Take 81 mg by mouth daily.     BYSTOLIC 10 MG tablet TAKE 1 TABLET BY MOUTH EVERY DAY IN THE MORNING 90 tablet 0   furosemide (LASIX) 80 MG tablet TAKE 0.5 TABS BY MOUTH 2 TIMES DAILY. PT NEEDS TO MAKE AN APPT WITH DR.FOR REFILLS, 1ST ATTEMPT 90 tablet 1   KLOR-CON M20 20 MEQ tablet TAKE 1 TABLET BY MOUTH EVERY DAY 90 tablet 1   methylPREDNISolone (MEDROL DOSEPAK) 4 MG TBPK tablet Take as directed on package. 21 tablet 0   naproxen (NAPROSYN) 500 MG tablet Take 1 tablet (500 mg total) by mouth 2 (two) times daily with a meal. 20 tablet 0   naproxen sodium (ALEVE) 220 MG tablet Take 1 tablet (220 mg total) by mouth 2 (two) times daily with a meal. 18 tablet 0   sildenafil (REVATIO) 20 MG tablet Take 1-5 tablets as need one hour before sexual activity. No more than 100 mg in 24 hours. 25 tablet 0   No current facility-administered medications for this visit.     Allergies Allergies  Allergen Reactions   Tomato Rash    Histories Past Medical History:  Diagnosis Date   Asthma    mild, no inhaler used   CHF (congestive heart failure) (HCC)    DJD (degenerative joint disease)    has taken tramadol 33m q8h in past   GERD (gastroesophageal reflux disease)    Hyperlipidemia    Hypertension    [facility]    weighed in excess of 500 lbs at one point   OSA (obstructive sleep apnea)    did not tolerate CPAP, uses nasal cannula   Proteinuria    GFR 94.12 February 2015   Past Surgical History:  Procedure Laterality Date   COLONOSCOPY N/A 04/22/2013   Procedure: COLONOSCOPY;  Surgeon: DShann Medal MD;  Location: [facility];  Service: General;  Laterality: N/A;   leg surgery for fracture Right 2008 or 2009   SPHINCTEROTOMY Left 04/22/2013   Procedure: Exam under anesthesia, left  lateral internal SPHINCTEROTOMY;  Surgeon: DShann Medal MD;  Location: [facility];  Service: General;  Laterality: Left;   Social History   Socioeconomic History   Marital status: Single    Spouse name: Not on file   Number of children: Not on file   Years of education: Not on file   Highest education level: Not on file  Occupational History   Not on file  Social Needs   Financial resource strain: Not on file   Food insecurity    Worry: Not on file    Inability: Not on file   Transportation needs    Medical: Not on file    Non-medical: Not on file  Tobacco Use   Smoking status: Former Smoker    Packs/day: 0.20    Years: 1.50    Pack years: 0.30    Types: Cigarettes    Quit date: 1982  Years since quitting: 38.6   Smokeless tobacco: Never Used  Substance and Sexual Activity   Alcohol use: Yes    Alcohol/week: 3.0 standard drinks    Types: 3 Standard drinks or equivalent per week    Comment: 3 drinks of rum per week   Drug use: No   Sexual activity: Not on file  Lifestyle   Physical activity    Days per week: Not on file    Minutes per session: Not on file   Stress: Not on file  Relationships   Social connections    Talks on phone: Not on file    Gets together: Not on file    Attends religious service: Not on file    Active member of club or organization: Not on file    Attends meetings of clubs or organizations: Not on file    Relationship status: Not on file   Intimate partner violence    Fear of current or ex partner: Not on file    Emotionally abused: Not on file    Physically abused: Not on file    Forced sexual activity: Not on file  Other Topics Concern   Not on file  Social History Narrative   Not on file   Family History  Problem Relation Age of Onset   Heart disease Father        MI, DM2, HTN   Hypertension Mother        died at 38 DM2, HTN   Cancer Mother    Colon cancer Neg Hx    Esophageal cancer Neg Hx     Inflammatory bowel disease Neg Hx    Liver disease Neg Hx    Pancreatic cancer Neg Hx    Rectal cancer Neg Hx    Stomach cancer Neg Hx    I have reviewed his medical, social, and family history in detail and updated the electronic medical record as necessary.    PHYSICAL EXAMINATION  BP 132/78 (BP Location: Right Arm, Patient Position: Sitting, Cuff Size: Normal) Comment (BP Location): forearm   Pulse 72    Temp 98.1 F (36.7 C) (Oral)    Ht _0  (1.753 m)    Wt (!) 315 lb (142.9 kg)    SpO2 99%    BMI 46.52 kg/m  Wt Readings from Last 3 Encounters:  04/21/19 (!) 315 lb (142.9 kg)  04/14/19 300 lb (136.1 kg)  04/08/19 (!) 311 lb 3.2 oz (141.2 kg)  GEN: NAD, appears stated age, doesn't appear chronically ill PSYCH: Cooperative, without pressured speech EYE: Conjunctivae pink, sclerae anicteric ENT: MMM, without oral ulcers NECK: Supple, enlarged neck girth CV: Non-tachycardic without rubs or gallops RESP: Decreased breath sounds at the bases bilaterally but no adventitious breath sounds present GI: NABS, soft, protuberant, rounded, obese, nontender, without rebound or guarding, no HSM appreciated MSK/EXT: Bilateral pedal edema present (trace) SKIN: No jaundice NEURO:  Alert & Oriented x 3, no focal deficits   REVIEW OF DATA  I reviewed the following data at the time of this encounter:  GI Procedures and Studies  August 2014 colonoscopy (performed prior to anal fissure surgery) The flexible Pentax colonoscope was passed up the rectum without difficulty.  The scope was advanced to the cecum and the ileocecal valve was identified.  The colonic prep was good.   The right colon, transverse colon, left colon, and sigmoid colon were unremarkable.  He had a severe turn at the hepatic flexure, but I was able to get  into the cecum and visualize the ileo-cecal valve.  He did have a rare sigmoid diverticula. The scope was withdrawn into the rectum and retroflexed.  The rectum was  unremarkable.  The patient's next colonoscopy should be in 10 years.  Laboratory Studies  Reviewed those in epic  Imaging Studies  2011 abdominal ultrasound IMPRESSION: 1.  The exam is significantly limited technically by large body habitus. 2.  No definite acute or significant abdominal findings. 3.  A right pleural effusion is incidentally noted. 4.  Small right renal cysts.   ASSESSMENT  Mr. Suman is a 58 y.o. male with a pmh significant for CHFpEF, obesity, hypertension, hyperlipidemia, sleep apnea, diverticulosis, prior anal fissure status post treatment), alcohol use, GERD.  The patient is seen today for evaluation and management of:  1. Bloating   2. Generalized abdominal pain   3. Pyrosis   4. Mild alcohol use disorder    The patient is hemodynamically and clinically stable.  Over the course the last week he has been doing very well and no longer is having any issues.  He has significantly decreased his intake of alcohol over this time.  And wonders whether some of this may be the reason for things.  With that being said I do think it is worthwhile for [place] to proceed with some basic work-up for the patient as he has had some recent laboratory work-up ruling out multiple issues.  However, he may end up requiring diagnostic evaluation via endoscopy and colonoscopy depending on how he is doing.  We will begin with is an ultrasound of the abdomen as well as a KUB to evaluate for any sources of abdominal pain.  Due to his body habitus it is possible that abdominal ultrasound will be difficult to interpret at which point we may consider a cross-sectional CT abdomen/pelvis.  We will send an H. pylori antigen as the patient has been off of PPI for a while and I would like to just ensure that he does not have underlying gastritis.  Interestingly, did not have improvement while on Dexilant so this may not be gastritis as to some of the postprandial abdominal discomfort.  The location and  regions of his previous discomfort are also a little bit further down than I would expect to be just from the upper GI tract.  Even though he had a reported normal colonoscopy in 2014 it may be worthwhile for [place] to consider the diagnostic colonoscopy and endoscopy if issues recur.  If they do recur, I would also plan to initiate a different PPI at high dose 40 mg twice daily likely Protonix or omeprazole or Nexium.  Patient bowel movements are doing well with his prior history of anal sphincter surgery after anal fissure but he will continue taking stool softeners and potentially try and increase his fiber supplementation as well.  I do think that the patient likely has some underlying functional GI disorder as a result of the significant stresses and anxieties that he deals with on a daily basis due to his mother and brother that he takes care of.  He otherwise does not take any medications to help with anxiety but this may be something that he can consider discussing with his primary care provider in future.  All patient questions were answered, to the best of my ability, and the patient agrees to the aforementioned plan of action with follow-up as indicated.   PLAN  Ultrasound abdomen and if unremarkable or not significant for findings  may consider cross-sectional CT abdomen/pelvis with IV/p.o. contrast KUB H. pylori stool antigen when able (while off PPI) Minimize alcohol consumption as able Try to work on optimizing stress levels at home although difficult may be worthwhile to discuss with primary care provider whether any medical or behavioral therapies could be helpful Hold on restarting PPI therapy for now We will consider diagnostic endoscopy/colonoscopy based on how patient does Minimize nonsteroidals as able   Orders Placed This Encounter  Procedures   Helicobacter pylori special antigen   US Abdomen Complete   DG Abd 1 View    New Prescriptions   No medications on file    Modified Medications   No medications on file    Planned Follow Up Return in about 6 weeks (around 06/02/2019).   Justice Britain, MD [facility]oscopy Office # [phone]

## 2019-04-21 NOTE — Patient Instructions (Signed)
Your provider has requested that you go to the basement level for lab and KUB work before leaving today. Press "B" on the elevator. The lab is located at the first door on the left as you exit the elevator.  You have been scheduled for an abdominal ultrasound at [facility] (1st floor of hospital) on 04/25/2019 at 8:15. Please arrive 15 minutes prior to your appointment for registration. Make certain not to have anything to eat or drink 6 hours prior to your appointment. Should you need to reschedule your appointment, please contact radiology at [phone]. This test typically takes about 30 minutes to perform.  Thank you for choosing me and [facility].  Dr. Rush Landmark

## 2019-04-22 DIAGNOSIS — R140 Abdominal distension (gaseous): Secondary | ICD-10-CM | POA: Insufficient documentation

## 2019-04-22 DIAGNOSIS — R12 Heartburn: Secondary | ICD-10-CM | POA: Insufficient documentation

## 2019-04-22 DIAGNOSIS — R1084 Generalized abdominal pain: Secondary | ICD-10-CM | POA: Insufficient documentation

## 2019-04-22 DIAGNOSIS — F1010 Alcohol abuse, uncomplicated: Secondary | ICD-10-CM | POA: Insufficient documentation

## 2019-04-23 ENCOUNTER — Encounter: Payer: Self-pay | Admitting: Gastroenterology

## 2019-04-25 ENCOUNTER — Other Ambulatory Visit: Payer: Medicare HMO

## 2019-04-25 ENCOUNTER — Other Ambulatory Visit: Payer: Self-pay

## 2019-04-25 ENCOUNTER — Encounter: Payer: Self-pay | Admitting: Family Medicine

## 2019-04-25 ENCOUNTER — Ambulatory Visit (HOSPITAL_COMMUNITY)
Admission: RE | Admit: 2019-04-25 | Discharge: 2019-04-25 | Disposition: A | Discharge status: Home / Self Care | Payer: Medicare HMO | Source: Ambulatory Visit | Attending: Gastroenterology | Admitting: Gastroenterology

## 2019-04-25 DIAGNOSIS — F1010 Alcohol abuse, uncomplicated: Secondary | ICD-10-CM

## 2019-04-25 DIAGNOSIS — R1084 Generalized abdominal pain: Secondary | ICD-10-CM

## 2019-04-25 DIAGNOSIS — R140 Abdominal distension (gaseous): Secondary | ICD-10-CM | POA: Diagnosis not present

## 2019-04-25 DIAGNOSIS — N281 Cyst of kidney, acquired: Secondary | ICD-10-CM

## 2019-04-25 HISTORY — DX: Cyst of kidney, acquired: N28.1

## 2019-04-26 LAB — HELICOBACTER PYLORI  SPECIAL ANTIGEN
MICRO NUMBER:: 778691
SPECIMEN QUALITY: ADEQUATE

## 2019-05-05 ENCOUNTER — Ambulatory Visit: Payer: Medicare HMO | Admitting: Orthopaedic Surgery

## 2019-05-07 ENCOUNTER — Other Ambulatory Visit: Payer: Self-pay

## 2019-05-07 ENCOUNTER — Ambulatory Visit
Admission: RE | Admit: 2019-05-07 | Discharge: 2019-05-07 | Disposition: A | Discharge status: Home / Self Care | Payer: Medicare HMO | Source: Ambulatory Visit | Attending: Orthopaedic Surgery | Admitting: Orthopaedic Surgery

## 2019-05-07 DIAGNOSIS — M545 Low back pain, unspecified: Secondary | ICD-10-CM

## 2019-05-07 DIAGNOSIS — M48061 Spinal stenosis, lumbar region without neurogenic claudication: Secondary | ICD-10-CM | POA: Diagnosis not present

## 2019-05-09 ENCOUNTER — Other Ambulatory Visit: Payer: Medicare HMO

## 2019-05-09 ENCOUNTER — Telehealth: Payer: Self-pay | Admitting: Family Medicine

## 2019-05-09 NOTE — Telephone Encounter (Signed)
Pt was notified to call Dr. Durward Fortes office

## 2019-05-09 NOTE — Telephone Encounter (Signed)
He will need to contact his orthopedist for pain medication.

## 2019-05-09 NOTE — Telephone Encounter (Signed)
Pt called and states he continues to have issues with pain. He states he went for a MRI Friday. Pt is requesting a refill of Hydrocodone. Please send to [facility]. PT can be reached at [phone].

## 2019-05-10 ENCOUNTER — Other Ambulatory Visit: Payer: Self-pay

## 2019-05-10 ENCOUNTER — Ambulatory Visit (INDEPENDENT_AMBULATORY_CARE_PROVIDER_SITE_OTHER): Payer: Medicare HMO | Admitting: Orthopaedic Surgery

## 2019-05-10 ENCOUNTER — Encounter: Payer: Self-pay | Admitting: Orthopaedic Surgery

## 2019-05-10 VITALS — Ht 69.0 in | Wt 315.0 lb

## 2019-05-10 DIAGNOSIS — M545 Low back pain: Secondary | ICD-10-CM | POA: Diagnosis not present

## 2019-05-10 DIAGNOSIS — M79605 Pain in left leg: Secondary | ICD-10-CM

## 2019-05-10 MED ORDER — HYDROCODONE-ACETAMINOPHEN 5-325 MG PO TABS: 1.0000 | ORAL_TABLET | Freq: Three times a day (TID) | ORAL | 0 refills | Status: DC | PRN

## 2019-05-10 NOTE — Progress Notes (Signed)
Office Visit Note   Patient: [name]           Date of Birth: October 14, 1960           MRN: [id number] Visit Date: 05/10/2019              Requested by: [name], NP-C [place],  [place] [address] PCP: [name], NP-C   Assessment & Plan: Visit Diagnoses:  1. Low back pain radiating to left leg     Plan: MRI scan of lumbar spine demonstrates severe multilevel facet arthrosis which could be a cause of back pain.  There was severe left L3 and right L1 neural foraminal stenosis.  [name] is not symptomatic on the right.  He also had moderate bilateral L2 and right L3 neural foraminal stenosis.  He notes that his back and leg pain are little bit better but he still  Is concerned about having numbness in the plantar aspect of his left foot and inability to flex his left great toe.  He has been having trouble since July.  He relates that he is not diabetic but his BMI is 47.  I am not sure that I can ascribe the problem with his great toe and numbness on plantar aspect of his foot to the MRI scan of his lumbar spine.  I think he is probably ruptured the flexor hallucis longus tendon. he is not having any tenderness behind the lateral malleolus but I am going to order an MRI scan of his ankle.  I will also order EMGs nerve conduction studies as he may have a neuropathy causing the numbness only on the plantar aspect of the left foot.  Will prescribe hydrocodone for pain  Follow-Up Instructions: Return After EMGs and nerve conduction studies left lower extremity and MRI of left ankle.   Orders:  No orders of the defined types were placed in this encounter.  Meds ordered this encounter  Medications  . HYDROcodone-acetaminophen (NORCO/VICODIN) 5-325 MG tablet    Sig: Take 1 tablet by mouth every 8 (eight) hours as needed for moderate pain.    Dispense:  30 tablet    Refill:  0    Order Specific Question:   Supervising Provider    Answer:   Garald Balding  [[id number]]      Procedures: No procedures performed   Clinical Data: No additional findings.   Subjective: Chief Complaint  Patient presents with  . Lower Back - Follow-up  Patient presents today for a follow up on his lower back pain. He had an MRI on 05/07/2019. Patient states that there has been no change. [name] relates that his symptoms have been present since mid to the end of July when he experienced the back , left ankle and foot pain.  His back pain is a little bit better.  Still has occasional left buttock pain but his biggest concern is inability to flex the great toe and numbness of the only plantar aspect of his foot.  He relates that he is not diabetic he did have some hydrocodone through his primary care physician in July that seemed to help.  HPI  Review of Systems   Objective: Vital Signs: Ht 5\' 9"  (1.753 m)   Wt (!) 315 lb (142.9 kg)   BMI 46.52 kg/m   Physical Exam Constitutional:      Appearance: He is well-developed.  Eyes:     Pupils: Pupils are equal, round, and  reactive to light.  Pulmonary:     Effort: Pulmonary effort is normal.  Skin:    General: Skin is warm and dry.  Neurological:     Mental Status: He is alert and oriented to person, place, and time.  Psychiatric:        Behavior: Behavior normal.     Ortho Exam left foot was not swollen.  There was subjective decreased sensibility in the plantar aspect of the left foot.  He was also unable to flex the MP and IP joint of the great toe.  There was no problem with extension.  He had normal flexion extension of the lesser toes.  He had normal inversion eversion of the ankle and the foot.  Normal feeling on the right foot.  There was no tenderness behind the medial lateral malleolus a little he did have some mild hypertrophic changes.  He does have pes planus.  Straight leg raise negative.  Painless range of motion both hips. .  Normal sensibility in the dorsum of his foot.  Specialty  Comments:  No specialty comments available.  Imaging: No results found.   PMFS History: Patient Active Problem List   Diagnosis Date Noted  . Bilateral renal cysts 04/25/2019  . Bloating 04/22/2019  . Generalized abdominal pain 04/22/2019  . Mild alcohol use disorder 04/22/2019  . Pyrosis 04/22/2019  . Low back pain radiating to left leg 04/14/2019  . BMI 45.0-49.9, adult (HCC) 11/11/2018  . Bilateral primary osteoarthritis of knee 11/11/2018  . Family history of heart disease in male family member before age 56 06/17/2018  . Erectile dysfunction 06/17/2018  . Asthma   . Eosinophilia 04/16/2018  . HTN (hypertension) 04/23/2016  . Esophageal reflux 04/23/2016  . Morbid obesity (HCC) 04/23/2016  . Hypokalemia 04/23/2016  . OSA (obstructive sleep apnea) 04/23/2016  . Personal history of noncompliance with medical treatment, presenting hazards to health 04/23/2016  . History of proteinuria syndrome 04/23/2016  . History of erectile dysfunction 04/23/2016  . History of pneumonia 04/23/2016  . CHF (congestive heart failure) (HCC) 04/23/2016  . Rectal fissure 03/24/2013  . Hemorrhoid 08/18/2012   Past Medical History:  Diagnosis Date  . Asthma    mild, no inhaler used  . Bilateral renal cysts 04/25/2019  . CHF (congestive heart failure) (HCC)   . DJD (degenerative joint disease)    has taken tramadol 50mg  q8h in past  . GERD (gastroesophageal reflux disease)   . Hyperlipidemia   . Hypertension   . Morbid obesity (HCC)    weighed in excess of 500 lbs at one point  . OSA (obstructive sleep apnea)    did not tolerate CPAP, uses nasal cannula  . Proteinuria    GFR 94.12 February 2015    Family History  Problem Relation Age of Onset  . Heart disease Father        MI, DM2, HTN  . Hypertension Mother        died at 18 DM2, HTN  . Cancer Mother   . Colon cancer Neg Hx   . Esophageal cancer Neg Hx   . Inflammatory bowel disease Neg Hx   . Liver disease Neg Hx   . Pancreatic  cancer Neg Hx   . Rectal cancer Neg Hx   . Stomach cancer Neg Hx     Past Surgical History:  Procedure Laterality Date  . COLONOSCOPY N/A 04/22/2013   Procedure: COLONOSCOPY;  Surgeon: 04/24/2013, MD;  Location: [facility];  Service: General;  Laterality: N/A;  . leg surgery for fracture Right 2008 or 2009  . SPHINCTEROTOMY Left 04/22/2013   Procedure: Exam under anesthesia, left lateral internal SPHINCTEROTOMY;  Surgeon: Kandis Cocking, MD;  Location: [facility];  Service: General;  Laterality: Left;   Social History   Occupational History  . Not on file  Tobacco Use  . Smoking status: Former Smoker    Packs/day: 0.20    Years: 1.50    Pack years: 0.30    Types: Cigarettes    Quit date: 1982    Years since quitting: 38.6  . Smokeless tobacco: Never Used  Substance and Sexual Activity  . Alcohol use: Yes    Alcohol/week: 3.0 standard drinks    Types: 3 Standard drinks or equivalent per week    Comment: 3 drinks of rum per week  . Drug use: No  . Sexual activity: Not on file

## 2019-05-10 NOTE — Addendum Note (Signed)
Addended by: Lendon Collar on: 05/10/2019 12:45 PM   Modules accepted: Orders

## 2019-05-12 ENCOUNTER — Ambulatory Visit: Payer: Medicare HMO | Admitting: Orthopaedic Surgery

## 2019-05-12 ENCOUNTER — Other Ambulatory Visit: Payer: Self-pay | Admitting: Orthopaedic Surgery

## 2019-05-12 DIAGNOSIS — M545 Low back pain: Secondary | ICD-10-CM

## 2019-05-12 DIAGNOSIS — M79605 Pain in left leg: Secondary | ICD-10-CM

## 2019-05-19 ENCOUNTER — Other Ambulatory Visit: Payer: Medicare HMO

## 2019-06-02 ENCOUNTER — Ambulatory Visit: Payer: Medicare HMO | Admitting: Gastroenterology

## 2019-06-04 ENCOUNTER — Other Ambulatory Visit: Payer: Self-pay

## 2019-06-04 ENCOUNTER — Ambulatory Visit
Admission: RE | Admit: 2019-06-04 | Discharge: 2019-06-04 | Disposition: A | Discharge status: Home / Self Care | Payer: Medicare HMO | Source: Ambulatory Visit | Attending: Orthopaedic Surgery | Admitting: Orthopaedic Surgery

## 2019-06-04 DIAGNOSIS — M545 Low back pain, unspecified: Secondary | ICD-10-CM

## 2019-06-04 DIAGNOSIS — M19072 Primary osteoarthritis, left ankle and foot: Secondary | ICD-10-CM | POA: Diagnosis not present

## 2019-06-07 ENCOUNTER — Ambulatory Visit (INDEPENDENT_AMBULATORY_CARE_PROVIDER_SITE_OTHER): Payer: Medicare HMO | Admitting: Orthopaedic Surgery

## 2019-06-07 ENCOUNTER — Other Ambulatory Visit: Payer: Self-pay

## 2019-06-07 ENCOUNTER — Encounter: Payer: Self-pay | Admitting: Orthopaedic Surgery

## 2019-06-07 VITALS — BP 141/94 | HR 71 | Ht 69.0 in | Wt 315.0 lb

## 2019-06-07 DIAGNOSIS — M545 Low back pain, unspecified: Secondary | ICD-10-CM

## 2019-06-07 DIAGNOSIS — M25572 Pain in left ankle and joints of left foot: Secondary | ICD-10-CM | POA: Diagnosis not present

## 2019-06-07 DIAGNOSIS — M79605 Pain in left leg: Secondary | ICD-10-CM

## 2019-06-07 NOTE — Progress Notes (Signed)
Office Visit Note   Patient: [name]           Date of Birth: [birth year]           MRN: [id number] Visit Date: 06/07/2019              Requested by: [name], NP-C [place],  [place] [id number] PCP: [name], NP-C   Assessment & Plan: Visit Diagnoses:  1. Low back pain radiating to left leg   2. Pain in left ankle and joints of left foot     Plan: Mr. Barnhill has several issues that we are addressing.  The first is with his low back pain.  He has had a prior MRI scan last month demonstrating severe multilevel facet arthrosis he also had severe left L3 and right L1 neural foraminal stenosis.  We will try a course of physical therapy to see if that helps.  The second is with numbness and burning in his left foot both on the dorsum and plantar aspect of his foot.  EMGs and nerve conduction studies have been ordered and will be performed sometime in the next several weeks.  Third issue is with his inability to flex his left great toe.  I obtained an MRI of his left ankle which demonstrated 8 advanced degenerative changes involving the ankle, hindfoot and midfoot.  He has mild vertical orientation of the talus and pes planus.  There is mild tenosynovitis involving the peroneal tendons and posterior tibialis tendon.  There function is intact.  There was a longitudinal split type tear involving the flexor digitorum longus tendons above the level of the ankle mortise.  He is not having any problem with dorsiflexion or plantar function of the lesser toes.  Ankle ligaments are intact.  There was edema signals on the short flexor muscles of the foot particularly the abductor hallucis muscle and moderate edema in the flexor hallucis longus that could represent a muscle sprain or tear.  He is having difficulty flexing the great toe which is a real issue for him.  I would like Dr. Sharol Given to evaluate to see if he is a candidate to explore the flexor tendons. Office visit  over 30 minutes 50% of the time in counseling  Follow-Up Instructions: Return After EMGs and nerve conduction studies.  Will refer to Dr. Sharol Given for left ankle problem.   Orders:  Orders Placed This Encounter  Procedures  . Ambulatory referral to Orthopedic Surgery   No orders of the defined types were placed in this encounter.     Procedures: No procedures performed   Clinical Data: No additional findings.   Subjective: Chief Complaint  Patient presents with  . Left Ankle - Follow-up    MRI results  Patient presents today for a follow up on his left ankle. He had an MRI on 06/04/2019 and is here today for those results. Patient states that he feels the same, no change.  His issues include low back pain which have been addressed with the MRI scan and numbness and tingling of his left foot both on the dorsum and plantar aspects.  He is not having radicular pain from his low back at this time.  He also relates that he is not diabetic.  He is not having any problems with his right foot.  His BMI is 47.  He is also had difficulty flexing his left great toe which has been addressed with the MRI scan as above  HPI  Review of Systems  Constitutional: Negative for fatigue.  HENT: Negative for ear pain.   Eyes: Negative for pain.  Respiratory: Negative for shortness of breath.   Cardiovascular: Negative for leg swelling.  Gastrointestinal: Negative for constipation and diarrhea.  Endocrine: Negative for cold intolerance and heat intolerance.  Genitourinary: Negative for difficulty urinating.  Musculoskeletal: Positive for joint swelling.  Skin: Negative for rash.  Allergic/Immunologic: Positive for food allergies.  Neurological: Negative for weakness.  Hematological: Does not bruise/bleed easily.  Psychiatric/Behavioral: Negative for sleep disturbance.     Objective: Vital Signs: BP (!) 141/94   Pulse 71   Ht 5\' 9"  (1.753 m)   Wt (!) 315 lb (142.9 kg)   BMI 46.52 kg/m    Physical Exam Constitutional:      Appearance: He is well-developed.  Eyes:     Pupils: Pupils are equal, round, and reactive to light.  Pulmonary:     Effort: Pulmonary effort is normal.  Skin:    General: Skin is warm and dry.  Neurological:     Mental Status: He is alert and oriented to person, place, and time.  Psychiatric:        Behavior: Behavior normal.     Ortho Exam left foot and ankle exam demonstrates decreased sensibility in both the dorsal and plantar aspect of the foot.  +1 pulses.  The foot was warm.  He is able to dorsiflex and plantarflex the lesser toes.  He can dorsiflex the great toe but cannot flex either the MP of the IP joint of the great toe.  Pes planus.  No local areas of tenderness about the ankle or midfoot.  May be slight decrease in dorsiflexion and plantarflexion of the ankle.  Has normal inversion and eversion of his foot.  Some swelling about his ankle is nonpitting.  Very minimal tenderness behind both medial lateral malleolar line.  Straight leg raise negative.  Specialty Comments:  No specialty comments available.  Imaging: No results found.   PMFS History: Patient Active Problem List   Diagnosis Date Noted  . Pain in left ankle and joints of left foot 06/07/2019  . Bilateral renal cysts 04/25/2019  . Bloating 04/22/2019  . Generalized abdominal pain 04/22/2019  . Mild alcohol use disorder 04/22/2019  . Pyrosis 04/22/2019  . Low back pain radiating to left leg 04/14/2019  . BMI 45.0-49.9, adult (HCC) 11/11/2018  . Bilateral primary osteoarthritis of knee 11/11/2018  . Family history of heart disease in male family member before age 78 06/17/2018  . Erectile dysfunction 06/17/2018  . Asthma   . Eosinophilia 04/16/2018  . HTN (hypertension) 04/23/2016  . Esophageal reflux 04/23/2016  . Morbid obesity (HCC) 04/23/2016  . Hypokalemia 04/23/2016  . OSA (obstructive sleep apnea) 04/23/2016  . Personal history of noncompliance with medical  treatment, presenting hazards to health 04/23/2016  . History of proteinuria syndrome 04/23/2016  . History of erectile dysfunction 04/23/2016  . History of pneumonia 04/23/2016  . CHF (congestive heart failure) (HCC) 04/23/2016  . Rectal fissure 03/24/2013  . Hemorrhoid 08/18/2012   Past Medical History:  Diagnosis Date  . Asthma    mild, no inhaler used  . Bilateral renal cysts 04/25/2019  . CHF (congestive heart failure) (HCC)   . DJD (degenerative joint disease)    has taken tramadol 50mg  q8h in past  . GERD (gastroesophageal reflux disease)   . Hyperlipidemia   . Hypertension   . Morbid obesity (HCC)    weighed in  excess of 500 lbs at one point  . OSA (obstructive sleep apnea)    did not tolerate CPAP, uses nasal cannula  . Proteinuria    GFR 94.12 February 2015    Family History  Problem Relation Age of Onset  . Heart disease Father        MI, DM2, HTN  . Hypertension Mother        died at 7479 DM2, HTN  . Cancer Mother   . Colon cancer Neg Hx   . Esophageal cancer Neg Hx   . Inflammatory bowel disease Neg Hx   . Liver disease Neg Hx   . Pancreatic cancer Neg Hx   . Rectal cancer Neg Hx   . Stomach cancer Neg Hx     Past Surgical History:  Procedure Laterality Date  . COLONOSCOPY N/A 04/22/2013   Procedure: COLONOSCOPY;  Surgeon: Kandis Cockingavid H Newman, MD;  Location: [facility];  Service: General;  Laterality: N/A;  . leg surgery for fracture Right 2008 or 2009  . SPHINCTEROTOMY Left 04/22/2013   Procedure: Exam under anesthesia, left lateral internal SPHINCTEROTOMY;  Surgeon: Kandis Cockingavid H Newman, MD;  Location: [facility];  Service: General;  Laterality: Left;   Social History   Occupational History  . Not on file  Tobacco Use  . Smoking status: Former Smoker    Packs/day: 0.20    Years: 1.50    Pack years: 0.30    Types: Cigarettes    Quit date: 1982    Years since quitting: 38.7  . Smokeless tobacco: Never Used  Substance and Sexual Activity  . Alcohol use: Yes     Alcohol/week: 3.0 standard drinks    Types: 3 Standard drinks or equivalent per week    Comment: 3 drinks of rum per week  . Drug use: No  . Sexual activity: Not on file

## 2019-06-14 ENCOUNTER — Other Ambulatory Visit: Payer: Self-pay | Admitting: Orthopaedic Surgery

## 2019-06-14 DIAGNOSIS — M545 Low back pain, unspecified: Secondary | ICD-10-CM

## 2019-06-16 ENCOUNTER — Ambulatory Visit: Payer: Medicare HMO | Admitting: Orthopedic Surgery

## 2019-06-19 DIAGNOSIS — E7800 Pure hypercholesterolemia, unspecified: Secondary | ICD-10-CM

## 2019-06-19 DIAGNOSIS — J4520 Mild intermittent asthma, uncomplicated: Secondary | ICD-10-CM | POA: Insufficient documentation

## 2019-06-19 HISTORY — DX: Pure hypercholesterolemia, unspecified: E78.00

## 2019-06-19 NOTE — Patient Instructions (Addendum)
You can try the 100 mg (5 tablets) of the sildenafil 30 minutes before desired sexual activity.   Check your BP at home.    Preventive Care 22-58 Years Old, Male Preventive care refers to lifestyle choices and visits with your health care provider that can promote health and wellness. This includes:  A yearly physical exam. This is also called an annual well check.  Regular dental and eye exams.  Immunizations.  Screening for certain conditions.  Healthy lifestyle choices, such as eating a healthy diet, getting regular exercise, not using drugs or products that contain nicotine and tobacco, and limiting alcohol use. What can I expect for my preventive care visit? Physical exam Your health care provider will check:  Height and weight. These may be used to calculate body mass index (BMI), which is a measurement that tells if you are at a healthy weight.  Heart rate and blood pressure.  Your skin for abnormal spots. Counseling Your health care provider may ask you questions about:  Alcohol, tobacco, and drug use.  Emotional well-being.  Home and relationship well-being.  Sexual activity.  Eating habits.  Work and work Statistician. What immunizations do I need?  Influenza (flu) vaccine  This is recommended every year. Tetanus, diphtheria, and pertussis (Tdap) vaccine  You may need a Td booster every 10 years. Varicella (chickenpox) vaccine  You may need this vaccine if you have not already been vaccinated. Zoster (shingles) vaccine  You may need this after age 71. Measles, mumps, and rubella (MMR) vaccine  You may need at least one dose of MMR if you were born in 1957 or later. You may also need a second dose. Pneumococcal conjugate (PCV13) vaccine  You may need this if you have certain conditions and were not previously vaccinated. Pneumococcal polysaccharide (PPSV23) vaccine  You may need one or two doses if you smoke cigarettes or if you have certain  conditions. Meningococcal conjugate (MenACWY) vaccine  You may need this if you have certain conditions. Hepatitis A vaccine  You may need this if you have certain conditions or if you travel or work in places where you may be exposed to hepatitis A. Hepatitis B vaccine  You may need this if you have certain conditions or if you travel or work in places where you may be exposed to hepatitis B. Haemophilus influenzae type b (Hib) vaccine  You may need this if you have certain risk factors. Human papillomavirus (HPV) vaccine  If recommended by your health care provider, you may need three doses over 6 months. You may receive vaccines as individual doses or as more than one vaccine together in one shot (combination vaccines). Talk with your health care provider about the risks and benefits of combination vaccines. What tests do I need? Blood tests  Lipid and cholesterol levels. These may be checked every 5 years, or more frequently if you are over 24 years old.  Hepatitis C test.  Hepatitis B test. Screening  Lung cancer screening. You may have this screening every year starting at age 30 if you have a 30-pack-year history of smoking and currently smoke or have quit within the past 15 years.  Prostate cancer screening. Recommendations will vary depending on your family history and other risks.  Colorectal cancer screening. All adults should have this screening starting at age 23 and continuing until age 20. Your health care provider may recommend screening at age 99 if you are at increased risk. You will have tests every 1-10 years,  depending on your results and the type of screening test.  Diabetes screening. This is done by checking your blood sugar (glucose) after you have not eaten for a while (fasting). You may have this done every 1-3 years.  Sexually transmitted disease (STD) testing. Follow these instructions at home: Eating and drinking  Eat a diet that includes fresh  fruits and vegetables, whole grains, lean protein, and low-fat dairy products.  Take vitamin and mineral supplements as recommended by your health care provider.  Do not drink alcohol if your health care provider tells you not to drink.  If you drink alcohol: ? Limit how much you have to 0-2 drinks a day. ? Be aware of how much alcohol is in your drink. In the [place] one drink equals one 12 oz bottle of beer (355 mL), one 5 oz glass of wine (148 mL), or one 1 oz glass of hard liquor (44 mL). Lifestyle  Take daily care of your teeth and gums.  Stay active. Exercise for at least 30 minutes on 5 or more days each week.  Do not use any products that contain nicotine or tobacco, such as cigarettes, e-cigarettes, and chewing tobacco. If you need help quitting, ask your health care provider.  If you are sexually active, practice safe sex. Use a condom or other form of protection to prevent STIs (sexually transmitted infections).  Talk with your health care provider about taking a low-dose aspirin every day starting at age 74. What's next?  Go to your health care provider once a year for a well check visit.  Ask your health care provider how often you should have your eyes and teeth checked.  Stay up to date on all vaccines. This information is not intended to replace advice given to you by your health care provider. Make sure you discuss any questions you have with your health care provider. Document Released: 09/21/2015 Document Revised: 08/19/2018 Document Reviewed: 08/19/2018 Elsevier Patient Education  2020 [employer].

## 2019-06-19 NOTE — Progress Notes (Signed)
[name] is a 58 y.o. male who presents for annual wellness visit and follow-up on chronic medical conditions.  He has the following concerns:  He has generalized abdominal pain that he describes as "bloating". Seeing GI for this.   States he has been taking only 3 sildenafil tablets (60 mg) and not having any success.    Immunization History  Administered Date(s) Administered  . Influenza-Unspecified 07/01/2011, 07/19/2012  . Pneumococcal Polysaccharide-23 11/03/2011  . Td 11/03/2011  . Tdap 02/16/2014   Last colonoscopy: 2020 Last PSA: 2019 Dentist: none Ophtho: been 10 years Exercise: minimal  Other doctors caring for patient include: GI - Dr. Meridee Score Cardiology- Dr. Mayford Knife  [facility]- Dr. Lajoyce Corners. Has seen Dr. Cleophas Dunker    HTN- states he does not check his BP at home.  Taking Lasix 40 mg daily and Bystolic 10 mg. States he has not taken lisinopril for at least 6 months.  Reports taking potassium 20 MEQ once daily. He uses "no salt". Diet does include salt with potato chips and fried foods.   Asthma- no issues since 2009.  No allergies.   OSA and not on CPAP. States he does not want to use one.   GERD- not currently on medication. Has some flare ups. Seeing GI for this now.   Morbid obesity- [facility] called him. States he can only do afternoon appts and he was told they only have morning appts.   He is currently single. No kids.   Drinks alcohol -dark rum. States he drinks 1 drink on weekdays usually and a fifth of rum on the weekends.  Denies smoking  No drug use.     Depression screen:  See questionnaire below.     Depression screen [facility] 2/9 06/20/2019 06/17/2018 08/13/2017  Decreased Interest 0 0 0  Down, Depressed, Hopeless 0 0 0  PHQ - 2 Score 0 0 0    Fall Screen: See Questionaire below.   Fall Risk  06/20/2019 06/17/2018  Falls in the past year? 0 No  Number falls in past yr: 0 -  Injury with Fall? 0 -    ADL screen:  See  questionnaire below.  Functional Status Survey: Is the patient deaf or have difficulty hearing?: No Does the patient have difficulty seeing, even when wearing glasses/contacts?: No Does the patient have difficulty concentrating, remembering, or making decisions?: No Does the patient have difficulty walking or climbing stairs?: Yes(pain in general) Does the patient have difficulty dressing or bathing?: No Does the patient have difficulty doing errands alone such as visiting a doctor's office or shopping?: No   End of Life Discussion:  Patient does not have a living will and medical power of attorney Mother [name] will be his HCPOA. MOST form reviewed and signed today.   Review of Systems  Constitutional: -fever, -chills, -sweats, -unexpected weight change, -anorexia, -fatigue Allergy: -sneezing, -itching, -congestion Dermatology: denies changing moles, rash, lumps, new worrisome lesions ENT: -runny nose, -ear pain, -sore throat, -hoarseness, -sinus pain, -teeth pain, -tinnitus, -hearing loss, -epistaxis Cardiology:  -chest pain, -palpitations, -edema, -orthopnea, -paroxysmal nocturnal dyspnea Respiratory: -cough, -shortness of breath, -dyspnea on exertion, -wheezing, -hemoptysis Gastroenterology: -+bdominal pain, -nausea, -vomiting, -diarrhea, -constipation, -blood in stool, -changes in bowel movement, -dysphagia Hematology: -bleeding or bruising problems Musculoskeletal: -arthralgias, -myalgias, -joint swelling, -back pain, -neck pain, -cramping, -gait changes Ophthalmology: -vision changes, -eye redness, -itching, -discharge Urology: -dysuria, -difficulty urinating, -hematuria, -urinary frequency, -urgency, -incontinence Neurology: -headache, -weakness, -tingling, -numbness, -speech abnormality, -memory loss, -falls, -  dizziness Psychology:  -depressed mood, -agitation, -sleep problems   PHYSICAL EXAM:  BP 140/90   Pulse 67   Temp 97.8 F (36.6 C)   Ht 5\' 8"  (1.727 m)    Wt (!) 320 lb 9.6 oz (145.4 kg)   BMI 48.75 kg/m   General Appearance: Alert, cooperative, no distress, appears stated age Head: Normocephalic, without obvious abnormality, atraumatic Eyes: PERRL, conjunctiva/corneas clear, EOM's intact Ears: Normal TM's and external ear canals Nose: Mask in place  Throat: Mask in place  Neck: Supple, no lymphadenopathy, thyroid:no enlargement/tenderness/nodules Back: Spine nontender, no curvature, ROM normal, no CVA tenderness Lungs: Clear to auscultation bilaterally without wheezes, rales or ronchi; respirations unlabored Chest Wall: No tenderness or deformity Heart: Regular rate and rhythm, S1 and S2 normal, no murmur, rub or gallop Breast Exam: No chest wall tenderness, masses or gynecomastia Abdomen: Soft, non-tender, nondistended, normoactive bowel sounds, no masses, no hepatosplenomegaly Genitalia: declines  Extremities: No clubbing, cyanosis or edema Pulses: 2+ and symmetric all extremities Skin: Skin color, texture, turgor normal, no rashes or lesions Lymph nodes: Cervical, supraclavicular, and axillary nodes normal Neurologic: CNII-XII intact, normal strength, sensation and gait; reflexes 2+ and symmetric throughout   Psych: Normal mood, affect, hygiene and grooming  ASSESSMENT/PLAN: Medicare annual wellness visit, subsequent  -Here today for Medicare wellness visit.  Denies falls, depression, memory concerns or any difficulty performing ADLs.  Updated medical care team.  Discussed advanced directives, he currently does not have these on file so he was provided with forms for living will and healthcare power of attorney.   Routine general medical examination at a health care facility - Plan: CBC with Differential/Platelet, Comprehensive metabolic panel -Preventive healthcare discussed.  Colonoscopy up-to-date.  PSA last year was normal.  We will check another PSA to look at trends.  Reviewed immunizations. He declines flu shot.   Essential  hypertension - Plan: CBC with Differential/Platelet, Comprehensive metabolic panel -discussed that his BP is higher than goal range. Encouraged low salt diet. He will start checking his BP at home and follow up with me in 2-3 weeks. Continue on Bystolic and Lasix. Consider starting him back on lisinopril if needed. It is unclear why this medication was stopped, he denies side effects.   Morbid obesity (HCC) -counseling on healthy diet. He does not seem motivated to lose weight. He did not follow up with [facility]. States there times did not work with his schedule.   Elevated LDL cholesterol level - Plan: Lipid panel -check lipid panel and follow up  Mild intermittent asthma without complication -does not seem to be an issue. No flare up since 2009.  Screening for prostate cancer - Plan: PSA -follow up pending result  Erectile dysfunction- he will try taking 100 mg of sildenafil and let me know if this is not helping. Offered referral to urology. He declines.   Eosinophilic leukocytosis, unspecified type - Plan: CBC with Differential/Platelet -discussed results   Declines flu shot      Discussed PSA screening (risks/benefits), recommended at least 30 minutes of aerobic activity at least 5 days/week; proper sunscreen use reviewed; healthy diet and alcohol recommendations (less than or equal to 2 drinks/day) reviewed; regular seatbelt use; changing batteries in smoke detectors. Immunization recommendations discussed.  Colonoscopy recommendations reviewed.   Medicare Attestation I have personally reviewed: The patient's medical and social history Their use of alcohol, tobacco or illicit drugs Their current medications and supplements The patient's functional ability including ADLs,fall risks, home safety risks, cognitive,  and hearing and visual impairment Diet and physical activities Evidence for depression or mood disorders  The patient's weight, height, and BMI  have been recorded in the chart.  I have made referrals, counseling, and provided education to the patient based on review of the above and I have provided the patient with a written personalized care plan for preventive services.     Harland Dingwall, NP-C   06/20/2019

## 2019-06-20 ENCOUNTER — Ambulatory Visit (INDEPENDENT_AMBULATORY_CARE_PROVIDER_SITE_OTHER): Payer: Medicare HMO | Admitting: Family Medicine

## 2019-06-20 ENCOUNTER — Other Ambulatory Visit: Payer: Self-pay

## 2019-06-20 ENCOUNTER — Encounter: Payer: Self-pay | Admitting: Family Medicine

## 2019-06-20 VITALS — BP 140/90 | HR 67 | Temp 97.8°F | Ht 68.0 in | Wt 320.6 lb

## 2019-06-20 DIAGNOSIS — J4520 Mild intermittent asthma, uncomplicated: Secondary | ICD-10-CM

## 2019-06-20 DIAGNOSIS — I10 Essential (primary) hypertension: Secondary | ICD-10-CM | POA: Diagnosis not present

## 2019-06-20 DIAGNOSIS — Z0000 Encounter for general adult medical examination without abnormal findings: Secondary | ICD-10-CM

## 2019-06-20 DIAGNOSIS — E7800 Pure hypercholesterolemia, unspecified: Secondary | ICD-10-CM | POA: Diagnosis not present

## 2019-06-20 DIAGNOSIS — D7219 Other eosinophilia: Secondary | ICD-10-CM | POA: Diagnosis not present

## 2019-06-20 DIAGNOSIS — N529 Male erectile dysfunction, unspecified: Secondary | ICD-10-CM | POA: Diagnosis not present

## 2019-06-20 DIAGNOSIS — Z125 Encounter for screening for malignant neoplasm of prostate: Secondary | ICD-10-CM | POA: Diagnosis not present

## 2019-06-21 ENCOUNTER — Other Ambulatory Visit: Payer: Self-pay | Admitting: Family Medicine

## 2019-06-21 DIAGNOSIS — E7800 Pure hypercholesterolemia, unspecified: Secondary | ICD-10-CM

## 2019-06-21 LAB — CBC WITH DIFFERENTIAL/PLATELET
Basophils Absolute: 0.1 10*3/uL (ref 0.0–0.2)
Basos: 1 %
EOS (ABSOLUTE): 0.7 10*3/uL — ABNORMAL HIGH (ref 0.0–0.4)
Eos: 10 %
Hematocrit: 42 % (ref 37.5–51.0)
Hemoglobin: 13.9 g/dL (ref 13.0–17.7)
Immature Grans (Abs): 0 10*3/uL (ref 0.0–0.1)
Immature Granulocytes: 0 %
Lymphocytes Absolute: 1.3 10*3/uL (ref 0.7–3.1)
Lymphs: 18 %
MCH: 30.3 pg (ref 26.6–33.0)
MCHC: 33.1 g/dL (ref 31.5–35.7)
MCV: 92 fL (ref 79–97)
Monocytes Absolute: 0.4 10*3/uL (ref 0.1–0.9)
Monocytes: 6 %
Neutrophils Absolute: 4.6 10*3/uL (ref 1.4–7.0)
Neutrophils: 65 %
Platelets: 248 10*3/uL (ref 150–450)
RBC: 4.58 x10E6/uL (ref 4.14–5.80)
RDW: 14.4 % (ref 11.6–15.4)
WBC: 7.2 10*3/uL (ref 3.4–10.8)

## 2019-06-21 LAB — COMPREHENSIVE METABOLIC PANEL
ALT: 10 IU/L (ref 0–44)
AST: 19 IU/L (ref 0–40)
Albumin/Globulin Ratio: 1.5 (ref 1.2–2.2)
Albumin: 4.1 g/dL (ref 3.8–4.9)
Alkaline Phosphatase: 48 IU/L (ref 39–117)
BUN/Creatinine Ratio: 12 (ref 9–20)
BUN: 13 mg/dL (ref 6–24)
Bilirubin Total: 0.5 mg/dL (ref 0.0–1.2)
CO2: 23 mmol/L (ref 20–29)
Calcium: 9.4 mg/dL (ref 8.7–10.2)
Chloride: 102 mmol/L (ref 96–106)
Creatinine, Ser: 1.13 mg/dL (ref 0.76–1.27)
GFR calc Af Amer: 82 mL/min/{1.73_m2} (ref >59)
GFR calc non Af Amer: 71 mL/min/{1.73_m2} (ref >59)
Globulin, Total: 2.7 g/dL (ref 1.5–4.5)
Glucose: 77 mg/dL (ref 65–99)
Potassium: 4.6 mmol/L (ref 3.5–5.2)
Sodium: 139 mmol/L (ref 134–144)
Total Protein: 6.8 g/dL (ref 6.0–8.5)

## 2019-06-21 LAB — LIPID PANEL
Chol/HDL Ratio: 3.5 ratio (ref 0.0–5.0)
Cholesterol, Total: 226 mg/dL — ABNORMAL HIGH (ref 100–199)
HDL: 64 mg/dL (ref >39)
LDL Chol Calc (NIH): 146 mg/dL — ABNORMAL HIGH (ref 0–99)
Triglycerides: 89 mg/dL (ref 0–149)
VLDL Cholesterol Cal: 16 mg/dL (ref 5–40)

## 2019-06-21 LAB — PSA: Prostate Specific Ag, Serum: 0.7 ng/mL (ref 0.0–4.0)

## 2019-06-21 MED ORDER — ATORVASTATIN CALCIUM 10 MG PO TABS: 10.0000 mg | ORAL_TABLET | Freq: Every day | ORAL | 1 refills | Status: DC

## 2019-06-23 ENCOUNTER — Encounter (INDEPENDENT_AMBULATORY_CARE_PROVIDER_SITE_OTHER): Payer: Medicare HMO | Admitting: Diagnostic Neuroimaging

## 2019-06-23 ENCOUNTER — Ambulatory Visit (INDEPENDENT_AMBULATORY_CARE_PROVIDER_SITE_OTHER): Payer: Medicare HMO | Admitting: Diagnostic Neuroimaging

## 2019-06-23 ENCOUNTER — Other Ambulatory Visit: Payer: Self-pay

## 2019-06-23 DIAGNOSIS — Z0289 Encounter for other administrative examinations: Secondary | ICD-10-CM

## 2019-06-23 DIAGNOSIS — M79605 Pain in left leg: Secondary | ICD-10-CM

## 2019-06-27 ENCOUNTER — Encounter: Payer: Self-pay | Admitting: Orthopedic Surgery

## 2019-06-27 ENCOUNTER — Ambulatory Visit (INDEPENDENT_AMBULATORY_CARE_PROVIDER_SITE_OTHER): Payer: Medicare HMO | Admitting: Orthopedic Surgery

## 2019-06-27 ENCOUNTER — Other Ambulatory Visit: Payer: Self-pay | Admitting: Orthopaedic Surgery

## 2019-06-27 ENCOUNTER — Other Ambulatory Visit: Payer: Self-pay

## 2019-06-27 ENCOUNTER — Telehealth: Payer: Self-pay | Admitting: Orthopaedic Surgery

## 2019-06-27 VITALS — Ht 68.0 in | Wt 300.0 lb

## 2019-06-27 DIAGNOSIS — Z6841 Body Mass Index (BMI) 40.0 and over, adult: Secondary | ICD-10-CM

## 2019-06-27 DIAGNOSIS — M25572 Pain in left ankle and joints of left foot: Secondary | ICD-10-CM

## 2019-06-27 DIAGNOSIS — M545 Low back pain, unspecified: Secondary | ICD-10-CM

## 2019-06-27 DIAGNOSIS — M79605 Pain in left leg: Secondary | ICD-10-CM | POA: Diagnosis not present

## 2019-06-27 MED ORDER — METHOCARBAMOL 500 MG PO TABS: ORAL_TABLET | ORAL | 0 refills | Status: DC

## 2019-06-27 MED ORDER — TRAMADOL HCL 50 MG PO TABS: ORAL_TABLET | ORAL | 0 refills | Status: DC

## 2019-06-27 NOTE — Telephone Encounter (Signed)
Robaxin 500mg  #20 1 tab po bid prn and tramadol 50mg  #30 1-2 tabs po bid prn

## 2019-06-27 NOTE — Telephone Encounter (Signed)
Patient called stating he has been experiencing "severe back pain" since Saturday, 06/25/19.  Patient is requesting a prescription of pain medicine and muscle relaxer to be sent to [facility] at [address] [address] [facility].

## 2019-06-27 NOTE — Telephone Encounter (Signed)
Patient notified that medications have been sent to his pharmacy.

## 2019-06-27 NOTE — Telephone Encounter (Signed)
Please advise 

## 2019-06-27 NOTE — Progress Notes (Signed)
Office Visit Note   Patient: [name]           Date of Birth: Oct 02, 1960           MRN: [id number] Visit Date: 06/27/2019              Requested by: Avanell Shackleton, NP-C [address] [place],  [place] [address] PCP: Avanell Shackleton, NP-C  Chief Complaint  Patient presents with  . Left Ankle - Pain      HPI: Examination patient is seen for initial evaluation for left foot and ankle pain patient complains of numbness over the lateral aspect of the left foot he states he does not have active flexion of the great toe and has weakness with dorsiflexion of the ankle.  Patient is morbidly obese denies a history of diabetes.  Patient is seen status post MRI scan of his lumbar spine and left ankle.  Assessment & Plan: Visit Diagnoses: No diagnosis found.  Plan: Discussed with the patient his decreased sensation and motor weakness in the left extremity is most likely due to his lumbar spine pathology not due to the MRI findings of the foot and ankle.  Will set up an appointment with Dr. Alvester Morin for evaluation for epidural steroid injections.  Follow-Up Instructions: No follow-ups on file.   Ortho Exam  Patient is alert, oriented, no adenopathy, well-dressed, normal affect, normal respiratory effort. Examination patient has an antalgic gait he has foot drop on the left with ambulation.  Patient does not have good flexion of the left great toe he does have good active extension of the left great toe and good flexion of the lesser toes.  Patient has weakness with dorsiflexion of the ankle.  Flexor houses longus strength 3/5 ankle dorsiflexion strength 4/5.  Examination the ankle he does not have tenderness to palpation anteriorly over the ankle he does not have tenderness to palpation over the sinus Tarsi the posterior tibial tendon and the peroneal tendons are nontender to palpation.  Review of the MRI scan does show an osteochondral defect of the talus however this is asymptomatic  the MRI scan does shows subtalar osteochondral defect which is also asymptomatic the MRI scan does show tendinitis of along the flexor digitorum longus as well as the peroneal tendons but patient has no tenderness to palpation around these tendons.  Patient's major concern is the lack of sensation of the dorsal lateral aspect of the left foot.  Imaging: No results found. No images are attached to the encounter.  Labs: Lab Results  Component Value Date   HGBA1C 5.5 04/16/2018   REPTSTATUS 11/09/2009 FINAL 11/08/2009   REPTSTATUS 11/09/2009 FINAL 11/08/2009   CULT NO GROWTH 11/08/2009     Lab Results  Component Value Date   ALBUMIN 4.1 06/20/2019   ALBUMIN 4.1 03/02/2019   ALBUMIN 3.9 06/17/2018    Lab Results  Component Value Date   MG 1.2 (L) 11/12/2009   MG 1.6 11/11/2009   MG 1.8 11/10/2009   No results found for: VD25OH  No results found for: PREALBUMIN CBC EXTENDED Latest Ref Rng & Units 06/20/2019 03/02/2019 06/17/2018  WBC 3.4 - 10.8 x10E3/uL 7.2 6.9 8.7  RBC 4.14 - 5.80 x10E6/uL 4.58 4.78 4.68  HGB 13.0 - 17.7 g/dL 70.6 23.7 62.8  HCT 31.5 - 51.0 % 42.0 43.9 43.2  PLT 150 - 450 x10E3/uL 248 272 271  NEUTROABS 1.4 - 7.0 x10E3/uL 4.6 4.4 5.9  LYMPHSABS 0.7 - 3.1 x10E3/uL 1.3 1.2  1.4     Body mass index is 45.61 kg/m.  Orders:  No orders of the defined types were placed in this encounter.  No orders of the defined types were placed in this encounter.    Procedures: No procedures performed  Clinical Data: No additional findings.  ROS:  All other systems negative, except as noted in the HPI. Review of Systems  Objective: Vital Signs: Ht 5\' 8"  (1.727 m)   Wt 300 lb (136.1 kg)   BMI 45.61 kg/m   Specialty Comments:  No specialty comments available.  PMFS History: Patient Active Problem List   Diagnosis Date Noted  . Elevated LDL cholesterol level 06/19/2019  . Mild intermittent asthma without complication 06/19/2019  . Pain in left ankle and  joints of left foot 06/07/2019  . Bilateral renal cysts 04/25/2019  . Bloating 04/22/2019  . Generalized abdominal pain 04/22/2019  . Mild alcohol use disorder 04/22/2019  . Pyrosis 04/22/2019  . Low back pain radiating to left leg 04/14/2019  . BMI 45.0-49.9, adult (HCC) 11/11/2018  . Bilateral primary osteoarthritis of knee 11/11/2018  . Family history of heart disease in male family member before age 80 06/17/2018  . Erectile dysfunction 06/17/2018  . Asthma   . Eosinophilia 04/16/2018  . HTN (hypertension) 04/23/2016  . Esophageal reflux 04/23/2016  . Morbid obesity (HCC) 04/23/2016  . Hypokalemia 04/23/2016  . OSA (obstructive sleep apnea) 04/23/2016  . Personal history of noncompliance with medical treatment, presenting hazards to health 04/23/2016  . History of proteinuria syndrome 04/23/2016  . History of erectile dysfunction 04/23/2016  . History of pneumonia 04/23/2016  . CHF (congestive heart failure) (HCC) 04/23/2016  . Rectal fissure 03/24/2013  . Hemorrhoid 08/18/2012   Past Medical History:  Diagnosis Date  . Asthma    mild, no inhaler used  . Bilateral renal cysts 04/25/2019  . CHF (congestive heart failure) (HCC)   . DJD (degenerative joint disease)    has taken tramadol 50mg  q8h in past  . GERD (gastroesophageal reflux disease)   . Hyperlipidemia   . Hypertension   . Morbid obesity (HCC)    weighed in excess of 500 lbs at one point  . OSA (obstructive sleep apnea)    did not tolerate CPAP, uses nasal cannula  . Proteinuria    GFR 94.12 February 2015    Family History  Problem Relation Age of Onset  . Heart disease Father        MI, DM2, HTN  . Hypertension Mother        68 DM2, HTN  . Cancer Mother   . Diabetes Brother   . Stroke Brother   . Colon cancer Neg Hx   . Esophageal cancer Neg Hx   . Inflammatory bowel disease Neg Hx   . Liver disease Neg Hx   . Pancreatic cancer Neg Hx   . Rectal cancer Neg Hx   . Stomach cancer Neg Hx     Past  Surgical History:  Procedure Laterality Date  . COLONOSCOPY N/A 04/22/2013   Procedure: COLONOSCOPY;  Surgeon: Kandis Cocking, MD;  Location: [facility];  Service: General;  Laterality: N/A;  . leg surgery for fracture Right 2008 or 2009  . SPHINCTEROTOMY Left 04/22/2013   Procedure: Exam under anesthesia, left lateral internal SPHINCTEROTOMY;  Surgeon: Kandis Cocking, MD;  Location: [facility];  Service: General;  Laterality: Left;   Social History   Occupational History  . Not on file  Tobacco Use  .  Smoking status: Former Smoker    Packs/day: 0.20    Years: 1.50    Pack years: 0.30    Types: Cigarettes    Quit date: 1982    Years since quitting: 38.8  . Smokeless tobacco: Never Used  Substance and Sexual Activity  . Alcohol use: Yes    Comment: 1/5 on weekend   . Drug use: No  . Sexual activity: Yes    Partners: Female

## 2019-06-28 NOTE — Procedures (Signed)
[facility]  NCS (NERVE CONDUCTION STUDY) WITH EMG (ELECTROMYOGRAPHY) REPORT   STUDY DATE: 06/23/19 PATIENT NAME: [name] DOB: 01-09-1961 MRN: [id number]  ORDERING CLINICIAN: Kearney Hard, MD  TECHNOLOGIST: Durenda Age ELECTROMYOGRAPHER: Glenford Bayley. Tekisha Darcey, MD  CLINICAL INFORMATION: 58 year old male with low back pain and left leg pain radiating to the left big toe.  Symptoms started July 2020.   FINDINGS: NERVE CONDUCTION STUDY:  Bilateral peroneal motor responses have decreased amplitudes and borderline slow conduction velocities.  Left tibial motor response could not be obtained.  Right tibial motor response has decreased amplitude and normal conduction velocity.  Bilateral sural and superficial peroneal sensory responses are decreased amplitudes.   NEEDLE ELECTROMYOGRAPHY:  Needle examination of left lower extremity and left lumbar paraspinal muscles normal.   IMPRESSION:   Abnormal study demonstrating: - Diffuse, axonal sensorimotor polyneuropathy.   INTERPRETING PHYSICIAN:  Suanne Marker, MD Certified in Neurology, Neurophysiology and Neuroimaging  [facility] [address] [place], [place] [address] [phone]   [facility]    Nerve / Sites Muscle Latency Ref. Amplitude Ref. Rel Amp Segments Distance Velocity Ref. Area    ms ms mV mV %  cm m/s m/s mVms  L Peroneal - EDB     Ankle EDB 5.7 ?6.5 1.9 ?2.0 100 Ankle - EDB 9   6.7     Fib head EDB 12.2  1.6  83.8 Fib head - Ankle 28 43 ?44 6.0     Pop fossa EDB 15.1  0.8  50.7 Pop fossa - Fib head 10 36 ?44 2.5         Pop fossa - Ankle      R Peroneal - EDB     Ankle EDB 4.5 ?6.5 1.7 ?2.0 100 Ankle - EDB 9   5.1     Fib head EDB 11.1  1.2  72.5 Fib head - Ankle 28 43 ?44 4.7     Pop fossa EDB 13.4  1.0  78.5 Pop fossa - Fib head 10 43 ?44 4.4         Pop fossa - Ankle      L Tibial - AH     Ankle AH NR ?5.8 NR ?4.0 NR Ankle - AH 9   NR  R Tibial -  AH     Ankle AH 4.9 ?5.8 0.8 ?4.0 100 Ankle - AH 9   2.9     Pop fossa AH 13.5  0.5  60.3 Pop fossa - Ankle 35 41 ?41 3.6             SNC    Nerve / Sites Rec. Site Peak Lat Ref.  Amp Ref. Segments Distance    ms ms V V  cm  L Sural - Ankle (Calf)     Calf Ankle 4.1 ?4.4 5 ?6 Calf - Ankle 14  R Sural - Ankle (Calf)     Calf Ankle 3.3 ?4.4 5 ?6 Calf - Ankle 14  L Superficial peroneal - Ankle     Lat leg Ankle 4.0 ?4.4 4 ?6 Lat leg - Ankle 14  R Superficial peroneal - Ankle     Lat leg Ankle 3.4 ?4.4 5 ?6 Lat leg - Ankle 14             F  Wave    Nerve F Lat Ref.   ms ms  R Tibial - AH 55.9 ?56.0       EMG full  EMG Summary Table    Spontaneous MUAP Recruitment  Muscle IA Fib PSW Fasc Other Amp Dur. Poly Pattern  L. Vastus medialis Normal None None None _______ Normal Normal Normal Normal  L. Tibialis anterior Normal None None None _______ Normal Normal Normal Normal  L. Gastrocnemius (Medial head) Normal None None None _______ Increased Normal Normal Reduced  L. Lumbar paraspinals Normal None None None _______ Normal Normal Normal Normal

## 2019-06-30 ENCOUNTER — Ambulatory Visit (INDEPENDENT_AMBULATORY_CARE_PROVIDER_SITE_OTHER): Payer: Medicare HMO | Admitting: Gastroenterology

## 2019-06-30 ENCOUNTER — Other Ambulatory Visit: Payer: Self-pay

## 2019-06-30 ENCOUNTER — Encounter: Payer: Self-pay | Admitting: Gastroenterology

## 2019-06-30 VITALS — BP 120/88 | HR 71 | Temp 97.2°F | Ht 68.0 in | Wt 328.4 lb

## 2019-06-30 DIAGNOSIS — F1010 Alcohol abuse, uncomplicated: Secondary | ICD-10-CM

## 2019-06-30 DIAGNOSIS — K219 Gastro-esophageal reflux disease without esophagitis: Secondary | ICD-10-CM

## 2019-06-30 DIAGNOSIS — R1084 Generalized abdominal pain: Secondary | ICD-10-CM | POA: Diagnosis not present

## 2019-06-30 NOTE — Patient Instructions (Signed)
If you are age 57 or older, your body mass index should be between 23-30. Your Body mass index is 49.93 kg/m. If this is out of the aforementioned range listed, please consider follow up with your Primary Care Provider.  If you are age 2 or younger, your body mass index should be between 19-25. Your Body mass index is 49.93 kg/m. If this is out of the aformentioned range listed, please consider follow up with your Primary Care Provider.    Thank you for choosing me and [facility].  Dr. Rush Landmark

## 2019-06-30 NOTE — Progress Notes (Signed)
MAC stop  [place] VISIT   Primary Care Provider [name], NP-C [place] [address] [phone]  Patient Profile: [name] is a 58 y.o. male with a pmh significant for CHFpEF, obesity, hypertension, hyperlipidemia, sleep apnea, diverticulosis, prior anal fissure status post treatment), alcohol use, GERD.  The patient presents to the [facility] for an evaluation and management of problem(s) noted below:  Problem List 1. Generalized abdominal pain   2. Gastroesophageal reflux disease, unspecified whether esophagitis present   3. Mild alcohol use disorder     History of Present Illness See initial consultation note for full details of HPI.  Interval History The patient is doing much better at this time.  He continues to try and decrease his overall alcohol intake but is not having complete cessation.  He has not had any significant abdominal pain for over 2-1/2 to 3 weeks.  He is wanting to minimize any sort of procedures if possible.  He wants to continue to monitor things if at all possible.    GI Review of Systems Positive as above Negative for odynophagia, dysphagia, nausea, vomiting, change in bowel habits, melena, hematochezia   Review of Systems General: Denies fevers/chills/weight loss Cardiovascular: Denies chest pain/palpitations Pulmonary: Denies shortness of breath/nocturnal cough Gastroenterological: See HPI Genitourinary: Denies darkened urine Hematological: Denies easy bruising/bleeding Dermatological: Denies jaundice Psychological: Mood is stable and his social stressors at home remain stable   Medications Current Outpatient Medications  Medication Sig Dispense Refill  . aspirin 81 MG tablet Take 81 mg by mouth daily.    Marland Kitchen atorvastatin (LIPITOR) 10 MG tablet Take 1 tablet (10 mg total) by mouth daily. 90 tablet 1  . BYSTOLIC 10 MG tablet TAKE 1 TABLET BY MOUTH EVERY DAY IN THE  MORNING 90 tablet 0  . furosemide (LASIX) 80 MG tablet TAKE 0.5 TABS BY MOUTH 2 TIMES DAILY. PT NEEDS TO MAKE AN APPT WITH DR.FOR REFILLS, 1ST ATTEMPT 90 tablet 1  . KLOR-CON M20 20 MEQ tablet TAKE 1 TABLET BY MOUTH EVERY DAY 90 tablet 1  . methocarbamol (ROBAXIN) 500 MG tablet 1 tablet PO BID prn 20 tablet 0  . naproxen (NAPROSYN) 500 MG tablet Take 1 tablet (500 mg total) by mouth 2 (two) times daily with a meal. 20 tablet 0  . omeprazole (PRILOSEC) 20 MG capsule Take 20 mg by mouth daily.    . traMADol (ULTRAM) 50 MG tablet 1-2 PO BID prn 30 tablet 0   No current facility-administered medications for this visit.     Allergies Allergies  Allergen Reactions  . Tomato Rash    Histories Past Medical History:  Diagnosis Date  . Asthma    mild, no inhaler used  . Bilateral renal cysts 04/25/2019  . CHF (congestive heart failure) ([place])   . DJD (degenerative joint disease)    has taken tramadol 84m q8h in past  . GERD (gastroesophageal reflux disease)   . Hyperlipidemia   . Hypertension   . [place]    weighed in excess of 500 lbs at one point  . OSA (obstructive sleep apnea)    did not tolerate CPAP, uses nasal cannula  . Proteinuria    GFR 94.12 February 2015   Past Surgical History:  Procedure Laterality Date  . COLONOSCOPY N/A 04/22/2013   Procedure: COLONOSCOPY;  Surgeon: DShann Medal MD;  Location: [facility];  Service: General;  Laterality: N/A;  . leg surgery for fracture Right 2008  or 2009  . SPHINCTEROTOMY Left 04/22/2013   Procedure: Exam under anesthesia, left lateral internal SPHINCTEROTOMY;  Surgeon: Shann Medal, MD;  Location: [facility];  Service: General;  Laterality: Left;   Social History   Socioeconomic History  . Marital status: Single    Spouse name: Not on file  . Number of children: Not on file  . Years of education: Not on file  . Highest education level: Not on file  Occupational History  . Not on file  Social Needs  . Financial resource  strain: Not on file  . Food insecurity    Worry: Not on file    Inability: Not on file  . Transportation needs    Medical: Not on file    Non-medical: Not on file  Tobacco Use  . Smoking status: Former Smoker    Packs/day: 0.20    Years: 1.50    Pack years: 0.30    Types: Cigarettes    Quit date: 1982    Years since quitting: 38.8  . Smokeless tobacco: Never Used  Substance and Sexual Activity  . Alcohol use: Yes    Comment: 1/5 on weekend   . Drug use: No  . Sexual activity: Yes    Partners: Female  Lifestyle  . Physical activity    Days per week: Not on file    Minutes per session: Not on file  . Stress: Not on file  Relationships  . Social Herbalist on phone: Not on file    Gets together: Not on file    Attends religious service: Not on file    Active member of club or organization: Not on file    Attends meetings of clubs or organizations: Not on file    Relationship status: Not on file  . Intimate partner violence    Fear of current or ex partner: Not on file    Emotionally abused: Not on file    Physically abused: Not on file    Forced sexual activity: Not on file  Other Topics Concern  . Not on file  Social History Narrative  . Not on file   Family History  Problem Relation Age of Onset  . Heart disease Father        MI, DM2, HTN  . Hypertension Mother        72 DM2, HTN  . Cancer Mother   . Diabetes Brother   . Stroke Brother   . Colon cancer Neg Hx   . Esophageal cancer Neg Hx   . Inflammatory bowel disease Neg Hx   . Liver disease Neg Hx   . Pancreatic cancer Neg Hx   . Rectal cancer Neg Hx   . Stomach cancer Neg Hx    I have reviewed his medical, social, and family history in detail and updated the electronic medical record as necessary.    PHYSICAL EXAMINATION  BP 120/88 (BP Location: Left Arm, Patient Position: Sitting, Cuff Size: Normal) Comment (Cuff Size): forearm  Pulse 71   Temp (!) 97.2 F (36.2 C) (Other (Comment))    Ht _0  (1.727 m)   Wt (!) 328 lb 6 oz (148.9 kg)   BMI 49.93 kg/m  Wt Readings from Last 3 Encounters:  06/30/19 (!) 328 lb 6 oz (148.9 kg)  06/27/19 300 lb (136.1 kg)  06/20/19 (!) 320 lb 9.6 oz (145.4 kg)  GEN: NAD, appears stated age, doesn't appear chronically ill PSYCH: Cooperative, without pressured speech EYE:  Conjunctivae pink, sclerae anicteric ENT: MMM, without oral ulcers CV: Non-tachycardic without rubs or gallops RESP: No adventitious breath sounds present GI: NABS, soft, protuberant, rounded, obese, nontender, without rebound or guarding, unable to appreciate hepatosplenomegaly due to body habitus MSK/EXT: Bilateral pedal edema present SKIN: No jaundice NEURO:  Alert & Oriented x 3, no focal deficits   REVIEW OF DATA  I reviewed the following data at the time of this encounter:  GI Procedures and Studies  No new relevant studies that have been reviewed previously  Laboratory Studies  Reviewed those in epic  Imaging Studies  August 2020 abdominal ultrasound IMPRESSION: 1.  Renal cysts bilaterally. 2. Portions of pancreas obscured by gas. Visualized portions of pancreas appear unremarkable. 3.  Study otherwise unremarkable.   ASSESSMENT  Mr. Meckel is a 58 y.o. male with a pmh significant for CHFpEF, obesity, hypertension, hyperlipidemia, sleep apnea, diverticulosis, prior anal fissure status post treatment), alcohol use, GERD.  The patient is seen today for evaluation and management of:  1. Generalized abdominal pain   2. Gastroesophageal reflux disease, unspecified whether esophagitis present   3. Mild alcohol use disorder    The patient is hemodynamically and clinically stable.  Over the course of last few weeks he has continued to do well since her last clinic visit.  He continues to work on decreasing his alcohol intake but is not ready or willing to completely have alcohol cessation.  Abdominal ultrasound was unremarkable.  Laboratory work-up was  unremarkable.  He does not want to undergo endoscopic evaluation at this time since he is doing well.  Since he has no dysphagia no other red flag symptoms currently stable he would like to minimize procedures and I think that is okay for now.  However, I do think he may end up requiring diagnostic upper and lower endoscopy depending on how things move forward in the future.  He is going to continue taking fiber supplementation.  And he will continue taking PPI at this time.  We will hold off on a CT abdomen pelvis for now as he is doing well.  All patient questions were answered, to the best of my ability, and the patient agrees to the aforementioned plan of action with follow-up as indicated.   PLAN  Continue PPI Continue to work on alcohol cessation/cut down Consider CT abdomen pelvis with IV/p.o. contrast depending on recurrence of symptoms Consider diagnostic EGD/colonoscopy based on how patient does and findings of CT abdomen/pelvis Minimize nonsteroidals as able Continue stress reduction at home as able High-fiber diet Continue to consider a fiber supplement such as Metamucil or FiberCon daily   No orders of the defined types were placed in this encounter.   New Prescriptions   No medications on file   Modified Medications   No medications on file    Planned Follow Up Return in about 3 months (around 09/30/2019).   Justice Britain, MD [facility] Advanced [facility]oscopy Office # [phone]

## 2019-07-03 ENCOUNTER — Encounter: Payer: Self-pay | Admitting: Gastroenterology

## 2019-07-11 ENCOUNTER — Ambulatory Visit (INDEPENDENT_AMBULATORY_CARE_PROVIDER_SITE_OTHER): Payer: Medicare HMO | Admitting: Family Medicine

## 2019-07-11 ENCOUNTER — Encounter: Payer: Self-pay | Admitting: Family Medicine

## 2019-07-11 ENCOUNTER — Other Ambulatory Visit: Payer: Self-pay

## 2019-07-11 VITALS — Wt 300.0 lb

## 2019-07-11 DIAGNOSIS — G4733 Obstructive sleep apnea (adult) (pediatric): Secondary | ICD-10-CM | POA: Diagnosis not present

## 2019-07-11 DIAGNOSIS — E7800 Pure hypercholesterolemia, unspecified: Secondary | ICD-10-CM | POA: Diagnosis not present

## 2019-07-11 DIAGNOSIS — I10 Essential (primary) hypertension: Secondary | ICD-10-CM | POA: Diagnosis not present

## 2019-07-11 NOTE — Progress Notes (Signed)
   Subjective:  Documentation for virtual audio and video telecommunications through [place] encounter:  The patient was located at home. 2 patient identifiers used.  The provider was located in the office. The patient did consent to this visit and is aware of possible charges through their insurance for this visit.  The other persons participating in this telemedicine service were none.    Patient ID: [name], male    DOB: 10/28/1960, 58 y.o.   MRN: [id number]  HPI Chief Complaint  Patient presents with  . follow-up    follow-up BP. not checking because his mom has been using his bp machine   This is a virtual follow up on HTN. BP at his previous visit on 06/20/19 for AWV was 140/90.  Reports taking Bystolic 10 mg, Lasix 40 mg.  Stopped lisinopril several months ago. Last prescription was 08/2018. He is not sure why he stopped the medication.  States he has not been checking his BP.  BP 120/88 at GI appt on 06/30/2019  Untreated OSA. Is not interested in using a CPAP but is aware of his condition.   HL- ASCVD 8.5% as of 06/20/2019 and he started on atorvastatin 2 weeks ago. Taking it daily and no problems.   No new concerns today.  Denies fever, chills, dizziness, chest pain, palpitations, shortness of breath, abdominal pain, N/V/D.   Reviewed allergies, medications, past medical, surgical, family, and social history.   Review of Systems Pertinent positives and negatives in the history of present illness.     Objective:   Physical Exam Wt 300 lb (136.1 kg)   BMI 45.61 kg/m   Alert and oriented and in no acute distress.  Normal speech, mood and thought process.      Assessment & Plan:  Uncontrolled hypertension  Elevated LDL cholesterol level  OSA (obstructive sleep apnea)  Discussed limitations of a virtual visit.   He reports being in his usual state of health. No new concerns.  He will follow up as scheduled later this month for recheck of BP since he  is not checking this at home. Consider adding back lisinopril if needed or referring him back to cardiology.  HL- continue statin therapy due to ASCVD 10 year risk of 8.5%. He is doing well on it so far. Recheck fasting lipids and liver function at follow up.  Aware that OSA is untreated and that this may be affecting his BP as well as the long term health consequences associated with untreated OSA.   Time spent on call was 16 minutes and in review of previous records 4 minutes total.  This virtual service is not related to other E/M service within previous 7 days.

## 2019-07-13 ENCOUNTER — Other Ambulatory Visit: Payer: Self-pay | Admitting: Orthopaedic Surgery

## 2019-07-13 NOTE — Telephone Encounter (Signed)
Ok to renew both.

## 2019-07-14 ENCOUNTER — Telehealth: Payer: Self-pay | Admitting: Orthopaedic Surgery

## 2019-07-14 NOTE — Telephone Encounter (Signed)
Called and spoke with patient. Advised that medications have been called into his [facility] pharmacy.

## 2019-07-16 ENCOUNTER — Other Ambulatory Visit: Payer: Self-pay | Admitting: Cardiology

## 2019-07-16 DIAGNOSIS — I10 Essential (primary) hypertension: Secondary | ICD-10-CM

## 2019-07-19 ENCOUNTER — Other Ambulatory Visit: Payer: Self-pay | Admitting: Family Medicine

## 2019-07-19 DIAGNOSIS — I10 Essential (primary) hypertension: Secondary | ICD-10-CM

## 2019-07-26 ENCOUNTER — Ambulatory Visit: Payer: Medicare HMO | Admitting: Family Medicine

## 2019-07-27 ENCOUNTER — Other Ambulatory Visit: Payer: Self-pay

## 2019-07-27 ENCOUNTER — Ambulatory Visit (INDEPENDENT_AMBULATORY_CARE_PROVIDER_SITE_OTHER): Payer: Medicare HMO | Admitting: Physical Medicine and Rehabilitation

## 2019-07-27 ENCOUNTER — Encounter: Payer: Self-pay | Admitting: Physical Medicine and Rehabilitation

## 2019-07-27 ENCOUNTER — Telehealth: Payer: Self-pay | Admitting: Physical Medicine and Rehabilitation

## 2019-07-27 VITALS — BP 164/100 | HR 72 | Ht 68.0 in | Wt 300.0 lb

## 2019-07-27 DIAGNOSIS — M5442 Lumbago with sciatica, left side: Secondary | ICD-10-CM | POA: Diagnosis not present

## 2019-07-27 DIAGNOSIS — M47816 Spondylosis without myelopathy or radiculopathy, lumbar region: Secondary | ICD-10-CM

## 2019-07-27 DIAGNOSIS — M5416 Radiculopathy, lumbar region: Secondary | ICD-10-CM | POA: Diagnosis not present

## 2019-07-27 DIAGNOSIS — G8929 Other chronic pain: Secondary | ICD-10-CM

## 2019-07-27 NOTE — Progress Notes (Signed)
 .  Numeric Pain Rating Scale and Functional Assessment Average Pain 9 Pain Right Now 2 My pain is intermittent and sharp Pain is worse with: walking and standing Pain improves with: rest and medication   In the last MONTH (on 0-10 scale) has pain interfered with the following?  1. General activity like being  able to carry out your everyday physical activities such as walking, climbing stairs, carrying groceries, or moving a chair?  Rating(9)  2. Relation with others like being able to carry out your usual social activities and roles such as  activities at home, at work and in your community. Rating(8)  3. Enjoyment of life such that you have  been bothered by emotional problems such as feeling anxious, depressed or irritable?  Rating(2)

## 2019-07-28 ENCOUNTER — Encounter: Payer: Self-pay | Admitting: Family Medicine

## 2019-07-28 NOTE — Telephone Encounter (Signed)
[id number] Auth No / Request ID  Status Auto-Approved Decision Approved Effective Date 07/28/2019 Expiration Date 09/11/2019

## 2019-07-31 ENCOUNTER — Other Ambulatory Visit: Payer: Self-pay | Admitting: Orthopaedic Surgery

## 2019-08-01 NOTE — Telephone Encounter (Signed)
Ok to renew?  

## 2019-08-02 ENCOUNTER — Other Ambulatory Visit: Payer: Self-pay

## 2019-08-02 ENCOUNTER — Ambulatory Visit (INDEPENDENT_AMBULATORY_CARE_PROVIDER_SITE_OTHER): Payer: Medicare HMO | Admitting: Physical Medicine and Rehabilitation

## 2019-08-02 ENCOUNTER — Encounter: Payer: Self-pay | Admitting: Physical Medicine and Rehabilitation

## 2019-08-02 ENCOUNTER — Ambulatory Visit: Payer: Self-pay

## 2019-08-02 ENCOUNTER — Other Ambulatory Visit: Payer: Self-pay | Admitting: Orthopaedic Surgery

## 2019-08-02 VITALS — BP 177/108 | HR 65

## 2019-08-02 DIAGNOSIS — M5416 Radiculopathy, lumbar region: Secondary | ICD-10-CM | POA: Diagnosis not present

## 2019-08-02 MED ORDER — TRAMADOL HCL 50 MG PO TABS: ORAL_TABLET | ORAL | 0 refills | Status: DC

## 2019-08-02 MED ORDER — METHYLPREDNISOLONE ACETATE 80 MG/ML IJ SUSP
80.0000 mg | Freq: Once | INTRAMUSCULAR | Status: AC
  Administered 2019-08-02: 16:00:00 80 mg

## 2019-08-02 NOTE — Progress Notes (Signed)
    Numeric Pain Rating Scale and Functional Assessment Average Pain (7)   In the last MONTH (on 0-10 scale) has pain interfered with the following?  1. General activity like being  able to carry out your everyday physical activities such as walking, climbing stairs, carrying groceries, or moving a chair?  Rating(10)   +Driver, -BT, -Dye Allergies.   

## 2019-08-15 ENCOUNTER — Telehealth: Payer: Self-pay | Admitting: Radiology

## 2019-08-15 ENCOUNTER — Other Ambulatory Visit: Payer: Self-pay

## 2019-08-15 ENCOUNTER — Ambulatory Visit (INDEPENDENT_AMBULATORY_CARE_PROVIDER_SITE_OTHER): Payer: Medicare HMO | Admitting: Family Medicine

## 2019-08-15 ENCOUNTER — Encounter: Payer: Self-pay | Admitting: Family Medicine

## 2019-08-15 VITALS — BP 124/80 | HR 71 | Temp 97.3°F | Wt 315.4 lb

## 2019-08-15 DIAGNOSIS — E7800 Pure hypercholesterolemia, unspecified: Secondary | ICD-10-CM | POA: Diagnosis not present

## 2019-08-15 DIAGNOSIS — Z79899 Other long term (current) drug therapy: Secondary | ICD-10-CM

## 2019-08-15 DIAGNOSIS — Z9189 Other specified personal risk factors, not elsewhere classified: Secondary | ICD-10-CM

## 2019-08-15 NOTE — Progress Notes (Signed)
   Subjective:    Patient ID: [name], male    DOB: 01-Jul-1961, 58 y.o.   MRN: [id number]  HPI Chief Complaint  Patient presents with  . Follow-up    follow-up on cholesterol   Here to follow up on elevated LDL. His 10 year ASCVD risk calculation was 8.5% at his previous visit so he started on atorvastatin 10 mg. Reports taking this daily without any side effects.  Diet is not that high in fatty foods per patient but he does like to eat butter.  He is not active.   He saw Dr. Ernestina Patches for an epidural on 08/02/2019 and needs his information to call him about his response.   No other concerns today.   Denies fever, chills, night sweats, myalgias, chest pain, shortness of breath, abdominal pain, N/V/D.   Reviewed allergies, medications, past medical, surgical, family, and social history.   Review of Systems Pertinent positives and negatives in the history of present illness.     Objective:   Physical Exam BP 124/80   Pulse 71   Temp (!) 97.3 F (36.3 C)   Wt (!) 315 lb 6.4 oz (143.1 kg)   BMI 47.96 kg/m   Alert and oriented and in no acute distress. Not otherwise examined.       Assessment & Plan:  Elevated LDL cholesterol level - Plan: Lipid Panel  At risk for heart disease  Medication management - Plan: Lipid Panel, ALT  Here today for follow-up on abnormal lipid panel with an ASCVD 10-year risk of 8.5% and denies ever being on a statin prior to 6 weeks ago. He reports good daily compliance without any side effects. Counseling done on healthy diet to help lower cholesterol.  Check lipids and follow up. Adjust dose of statin as appropriate.

## 2019-08-15 NOTE — Telephone Encounter (Signed)
Patient lmom for Dr. Ernestina Patches that he was having foot spasms, I tried to call but there was no answer--per FN he needs to see Dr. Sharol Given if he is having foot spasms.

## 2019-08-15 NOTE — Patient Instructions (Addendum)
Continue on your statin and I will be in touch with your lab results.    Dr. Laurence Spates [phone] [place]   Cholesterol Content in [place] Cholesterol is a waxy, fat-like substance that helps to carry fat in the blood. The body needs cholesterol in small amounts, but too much cholesterol can cause damage to the arteries and heart. Most people should eat less than 200 milligrams (mg) of cholesterol a day. Foods with cholesterol  Cholesterol is found in animal-based foods, such as meat, seafood, and dairy. Generally, low-fat dairy and lean meats have less cholesterol than full-fat dairy and fatty meats. The milligrams of cholesterol per serving (mg per serving) of common cholesterol-containing foods are listed below. Meat and other proteins  Egg - one large whole egg has 186 mg.  Veal shank - 4 oz has 141 mg.  Lean ground [place] (93% lean) - 4 oz has 118 mg.  Fat-trimmed lamb loin - 4 oz has 106 mg.  Lean ground beef (90% lean) - 4 oz has 100 mg.  Lobster - 3.5 oz has 90 mg.  Pork loin chops - 4 oz has 86 mg.  Canned salmon - 3.5 oz has 83 mg.  Fat-trimmed beef top loin - 4 oz has 78 mg.  Frankfurter - 1 frank (3.5 oz) has 77 mg.  Crab - 3.5 oz has 71 mg.  Roasted chicken without skin, white meat - 4 oz has 66 mg.  Light bologna - 2 oz has 45 mg.  Deli-cut [place] - 2 oz has 31 mg.  Canned tuna - 3.5 oz has 31 mg.  Bacon - 1 oz has 29 mg.  Oysters and mussels (raw) - 3.5 oz has 25 mg.  Mackerel - 1 oz has 22 mg.  Trout - 1 oz has 20 mg.  Pork sausage - 1 link (1 oz) has 17 mg.  Salmon - 1 oz has 16 mg.  Tilapia - 1 oz has 14 mg. Dairy  Soft-serve ice cream -  cup (4 oz) has 103 mg.  Whole-milk yogurt - 1 cup (8 oz) has 29 mg.  Cheddar cheese - 1 oz has 28 mg.  American cheese - 1 oz has 28 mg.  Whole milk - 1 cup (8 oz) has 23 mg.  2% milk - 1 cup (8 oz) has 18 mg.  Cream cheese - 1 tablespoon (Tbsp) has 15 mg.   Cottage cheese -  cup (4 oz) has 14 mg.  Low-fat (1%) milk - 1 cup (8 oz) has 10 mg.  Sour cream - 1 Tbsp has 8.5 mg.  Low-fat yogurt - 1 cup (8 oz) has 8 mg.  Nonfat Greek yogurt - 1 cup (8 oz) has 7 mg.  Half-and-half cream - 1 Tbsp has 5 mg. Fats and oils  Cod liver oil - 1 tablespoon (Tbsp) has 82 mg.  Butter - 1 Tbsp has 15 mg.  Lard - 1 Tbsp has 14 mg.  Bacon grease - 1 Tbsp has 14 mg.  Mayonnaise - 1 Tbsp has 5-10 mg.  Margarine - 1 Tbsp has 3-10 mg. Exact amounts of cholesterol in these foods may vary depending on specific ingredients and brands. Foods without cholesterol Most plant-based foods do not have cholesterol unless you combine them with a food that has cholesterol. Foods without cholesterol include:  Grains and cereals.  Vegetables.  Fruits.  Vegetable oils, such as olive, canola, and sunflower oil.  Legumes, such as peas, beans, and lentils.  Nuts and seeds.  Egg whites. Summary  The body needs cholesterol in small amounts, but too much cholesterol can cause damage to the arteries and heart.  Most people should eat less than 200 milligrams (mg) of cholesterol a day. This information is not intended to replace advice given to you by your health care provider. Make sure you discuss any questions you have with your health care provider. Document Released: 04/21/2017 Document Revised: 08/07/2017 Document Reviewed: 04/21/2017 Elsevier Patient Education  2020 [employer].

## 2019-08-16 ENCOUNTER — Other Ambulatory Visit: Payer: Self-pay | Admitting: Internal Medicine

## 2019-08-16 LAB — LIPID PANEL
Chol/HDL Ratio: 2.7 ratio (ref 0.0–5.0)
Cholesterol, Total: 200 mg/dL — ABNORMAL HIGH (ref 100–199)
HDL: 73 mg/dL (ref >39)
LDL Chol Calc (NIH): 114 mg/dL — ABNORMAL HIGH (ref 0–99)
Triglycerides: 71 mg/dL (ref 0–149)
VLDL Cholesterol Cal: 13 mg/dL (ref 5–40)

## 2019-08-16 LAB — ALT: ALT: 19 IU/L (ref 0–44)

## 2019-08-16 MED ORDER — ATORVASTATIN CALCIUM 20 MG PO TABS: 20.0000 mg | ORAL_TABLET | Freq: Every day | ORAL | 1 refills | Status: DC

## 2019-08-23 ENCOUNTER — Encounter: Payer: Self-pay | Admitting: Orthopedic Surgery

## 2019-08-23 ENCOUNTER — Other Ambulatory Visit: Payer: Self-pay | Admitting: Orthopaedic Surgery

## 2019-08-23 ENCOUNTER — Ambulatory Visit (INDEPENDENT_AMBULATORY_CARE_PROVIDER_SITE_OTHER): Payer: Medicare HMO | Admitting: Family

## 2019-08-23 ENCOUNTER — Other Ambulatory Visit: Payer: Self-pay | Admitting: Family Medicine

## 2019-08-23 ENCOUNTER — Other Ambulatory Visit: Payer: Self-pay

## 2019-08-23 VITALS — Ht 68.0 in | Wt 315.4 lb

## 2019-08-23 DIAGNOSIS — M25572 Pain in left ankle and joints of left foot: Secondary | ICD-10-CM

## 2019-08-23 DIAGNOSIS — E876 Hypokalemia: Secondary | ICD-10-CM

## 2019-08-23 DIAGNOSIS — M5416 Radiculopathy, lumbar region: Secondary | ICD-10-CM

## 2019-08-23 NOTE — Progress Notes (Signed)
Office Visit Note   Patient: [name]           Date of Birth: 1960/09/29           MRN: [id number] Visit Date: 08/23/2019              Requested by: [name], [name],  [name] [id number] PCP: [name], NP-C  Chief Complaint  Patient presents with  . Left Foot - Pain, Follow-up      HPI: The patient is a 58 year old gentleman who presents today in follow-up for left foot and ankle pain primarily complaining of numbness and tingling over the lateral aspect of his foot.  He complains that he is unable to actively flex his great toe and has some weakness with dorsiflexion of his ankle.  Denies any history of diabetes.  He is status post an MRI scan of his lumbar spine as well as his left ankle.  He is also status post a lumbar ESI with Dr. Ernestina Patches and is seen today in follow-up.  Denies any improvement in his symptoms with ESI.  Throughout the visit becomes increasingly irritated and frustrated feels no one has been able to properly diagnose his symptoms.  Assessment & Plan: Visit Diagnoses: No diagnosis found.  Plan: Again discussed with the patient his decreased sensation and motor weakness in the left lower extremity is most likely due to his lumbar spine pathology. Will refer to spine for their input.   Follow-Up Instructions: No follow-ups on file.   Ortho Exam  Patient is alert, oriented, no adenopathy, well-dressed, normal affect, normal respiratory effort. Examination patient has an antalgic gait. he has foot drop on the left with ambulation.  Patient does not have good flexion of the left great toe he does have good active extension of the left great toe and good flexion of the lesser toes.  Patient has weakness with dorsiflexion of the ankle.  Flexor houses longus strength 3/5 ankle dorsiflexion strength 4/5.  Examination the ankle he does not have tenderness to palpation anteriorly over the ankle he does not have tenderness to  palpation over the sinus Tarsi the posterior tibial tendon and the peroneal tendons are nontender to palpation.     Imaging: No results found. No images are attached to the encounter.  Labs: Lab Results  Component Value Date   HGBA1C 5.5 04/16/2018   REPTSTATUS 11/09/2009 FINAL 11/08/2009   REPTSTATUS 11/09/2009 FINAL 11/08/2009   CULT NO GROWTH 11/08/2009     Lab Results  Component Value Date   ALBUMIN 4.1 06/20/2019   ALBUMIN 4.1 03/02/2019   ALBUMIN 3.9 06/17/2018    Lab Results  Component Value Date   MG 1.2 (L) 11/12/2009   MG 1.6 11/11/2009   MG 1.8 11/10/2009   No results found for: VD25OH  No results found for: PREALBUMIN CBC EXTENDED Latest Ref Rng & Units 06/20/2019 03/02/2019 06/17/2018  WBC 3.4 - 10.8 x10E3/uL 7.2 6.9 8.7  RBC 4.14 - 5.80 x10E6/uL 4.58 4.78 4.68  HGB 13.0 - 17.7 g/dL 13.9 14.3 14.3  HCT 37.5 - 51.0 % 42.0 43.9 43.2  PLT 150 - 450 x10E3/uL 248 272 271  NEUTROABS 1.4 - 7.0 x10E3/uL 4.6 4.4 5.9  LYMPHSABS 0.7 - 3.1 x10E3/uL 1.3 1.2 1.4     Body mass index is 47.96 kg/m.  Orders:  No orders of the defined types were placed in this encounter.  No orders of the defined types were placed in  this encounter.    Procedures: No procedures performed  Clinical Data: No additional findings.  ROS:  All other systems negative, except as noted in the HPI. Review of Systems  Constitutional: Negative for chills and fever.  Neurological: Positive for weakness and numbness.    Objective: Vital Signs: Ht 5\' 8"  (1.727 m)   Wt (!) 315 lb 6.4 oz (143.1 kg)   BMI 47.96 kg/m   Specialty Comments:  No specialty comments available.  PMFS History: Patient Active Problem List   Diagnosis Date Noted  . Elevated LDL cholesterol level 06/19/2019  . Mild intermittent asthma without complication 06/19/2019  . Pain in left ankle and joints of left foot 06/07/2019  . Bilateral renal cysts 04/25/2019  . Bloating 04/22/2019  . Generalized  abdominal pain 04/22/2019  . Mild alcohol use disorder 04/22/2019  . Pyrosis 04/22/2019  . Low back pain radiating to left leg 04/14/2019  . BMI 45.0-49.9, adult (HCC) 11/11/2018  . Bilateral primary osteoarthritis of knee 11/11/2018  . Family history of heart disease in male family member before age 58 06/17/2018  . Erectile dysfunction 06/17/2018  . Asthma   . Eosinophilia 04/16/2018  . HTN (hypertension) 04/23/2016  . Esophageal reflux 04/23/2016  . Morbid obesity (HCC) 04/23/2016  . Hypokalemia 04/23/2016  . OSA (obstructive sleep apnea) 04/23/2016  . Personal history of noncompliance with medical treatment, presenting hazards to health 04/23/2016  . History of proteinuria syndrome 04/23/2016  . History of erectile dysfunction 04/23/2016  . History of pneumonia 04/23/2016  . CHF (congestive heart failure) (HCC) 04/23/2016  . Rectal fissure 03/24/2013  . Hemorrhoid 08/18/2012   Past Medical History:  Diagnosis Date  . Asthma    mild, no inhaler used  . Bilateral renal cysts 04/25/2019  . CHF (congestive heart failure) (HCC)   . DJD (degenerative joint disease)    has taken tramadol 50mg  q8h in past  . GERD (gastroesophageal reflux disease)   . Hyperlipidemia   . Hypertension   . Morbid obesity (HCC)    weighed in excess of 500 lbs at one point  . OSA (obstructive sleep apnea)    did not tolerate CPAP, uses nasal cannula  . Proteinuria    GFR 94.12 February 2015    Family History  Problem Relation Age of Onset  . Heart disease Father        MI, DM2, HTN  . Hypertension Mother        5479 DM2, HTN  . Cancer Mother   . Diabetes Brother   . Stroke Brother   . Colon cancer Neg Hx   . Esophageal cancer Neg Hx   . Inflammatory bowel disease Neg Hx   . Liver disease Neg Hx   . Pancreatic cancer Neg Hx   . Rectal cancer Neg Hx   . Stomach cancer Neg Hx     Past Surgical History:  Procedure Laterality Date  . COLONOSCOPY N/A 04/22/2013   Procedure: COLONOSCOPY;  Surgeon:  Kandis Cockingavid H Newman, MD;  Location: [facility];  Service: General;  Laterality: N/A;  . leg surgery for fracture Right 2008 or 2009  . SPHINCTEROTOMY Left 04/22/2013   Procedure: Exam under anesthesia, left lateral internal SPHINCTEROTOMY;  Surgeon: Kandis Cockingavid H Newman, MD;  Location: [facility];  Service: General;  Laterality: Left;   Social History   Occupational History  . Not on file  Tobacco Use  . Smoking status: Former Smoker    Packs/day: 0.20    Years: 1.50  Pack years: 0.30    Types: Cigarettes    Quit date: 25    Years since quitting: 38.9  . Smokeless tobacco: Never Used  Substance and Sexual Activity  . Alcohol use: Yes    Comment: 1/5 on weekend   . Drug use: No  . Sexual activity: Yes    Partners: Female

## 2019-08-24 NOTE — Telephone Encounter (Signed)
Ok to renew both.

## 2019-08-26 DIAGNOSIS — H524 Presbyopia: Secondary | ICD-10-CM | POA: Diagnosis not present

## 2019-08-26 DIAGNOSIS — H52223 Regular astigmatism, bilateral: Secondary | ICD-10-CM | POA: Diagnosis not present

## 2019-08-26 DIAGNOSIS — H5203 Hypermetropia, bilateral: Secondary | ICD-10-CM | POA: Diagnosis not present

## 2019-09-13 ENCOUNTER — Other Ambulatory Visit: Payer: Self-pay

## 2019-09-13 ENCOUNTER — Encounter: Payer: Self-pay | Admitting: Orthopaedic Surgery

## 2019-09-13 ENCOUNTER — Ambulatory Visit: Payer: Medicare HMO | Admitting: Orthopaedic Surgery

## 2019-09-13 VITALS — Ht 68.0 in | Wt 300.0 lb

## 2019-09-13 DIAGNOSIS — M545 Low back pain, unspecified: Secondary | ICD-10-CM

## 2019-09-13 DIAGNOSIS — M79605 Pain in left leg: Secondary | ICD-10-CM

## 2019-09-13 NOTE — Progress Notes (Signed)
Office Visit Note   Patient: [name]           Date of Birth: Dec 26, 1960           MRN: [id number] Visit Date: 09/13/2019              Requested by: [name], NP-C [place],  [place] [id number] PCP: [name], NP-C   Assessment & Plan: Visit Diagnoses:  1. Low back pain radiating to left leg     Plan: We reviewed his MRI scan he has significant facet arthrosis.  No history of crystal arthropathy.  He does have some severe right left L3 and right L1 neuroforaminal stenosis not consistent with the symptoms.  We discussed to continue to work on weight loss.  He has been using tramadol for pain we discussed problems with his past history of abnormal kidney function with regular anti-inflammatory usage.  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  Follow-Up Instructions: Return in about 3 months (around 12/12/2019).   Orders:  No orders of the defined types were placed in this encounter.  No orders of the defined types were placed in this encounter.     Procedures: No procedures performed   Clinical Data: No additional findings.   Subjective: Chief Complaint  Patient presents with  . Lower Back - Pain    HPI 59 year old male is been followed by Dr. Sharol Given previous epidural 08/02/2019 lumbar by Dr. Ernestina Patches for low back pain.  He has had back pain left leg pain that radiates down to his left foot with some numbness and tingling occasionally throbbing.  He is used tramadol and also Robaxin for pain.  He states the injection helped him bend and straighten his toes on the left foot better but he still has some pain and tingling.  MRI scan was done 05/08/2019 which is reviewed with patient today.  Review of Systems you of systems is updated.  He has had problems with hypertension back pain morbid obesity, congestive heart failure BMI greater than 45.  Positive for EtOH overuse, history of noncompliance, sleep  apnea otherwise noncontributory.   Objective: Vital Signs: Ht 5\' 8"  (1.727 m)   Wt 300 lb (136.1 kg)   BMI 45.61 kg/m   Physical Exam Constitutional:      Appearance: He is well-developed.  HENT:     Head: Normocephalic and atraumatic.  Eyes:     Pupils: Pupils are equal, round, and reactive to light.  Neck:     Thyroid: No thyromegaly.     Trachea: No tracheal deviation.  Cardiovascular:     Rate and Rhythm: Normal rate.  Pulmonary:     Effort: Pulmonary effort is normal.     Breath sounds: No wheezing.  Abdominal:     General: Bowel sounds are normal.     Palpations: Abdomen is soft.  Skin:    General: Skin is warm and dry.     Capillary Refill: Capillary refill takes less than 2 seconds.  Neurological:     Mental Status: He is alert and oriented to person, place, and time.  Psychiatric:        Behavior: Behavior normal.        Thought Content: Thought content normal.        Judgment: Judgment normal.     Ortho Exam patient has intact reflexes some pain with straight leg raising 90degrees.  No  trochanteric bursal pain negative logroll to his hips.  Anterior tib is strong.  Some difficulty with heel and toe walking but he is able to demonstrate for least a few steps.  Good quad strength.  Specialty Comments:  No specialty comments available.  Imaging: CLINICAL DATA:  Low back pain radiating to the left buttock  EXAM: MRI LUMBAR SPINE WITHOUT CONTRAST  TECHNIQUE: Multiplanar, multisequence MR imaging of the lumbar spine was performed. No intravenous contrast was administered.  COMPARISON:  None.  FINDINGS: Segmentation:  Standard.  Alignment:  Physiologic.  Vertebrae:  No fracture, evidence of discitis, or bone lesion.  Conus medullaris and cauda equina: Conus extends to the L1 level. Conus and cauda equina appear normal.  Paraspinal and other soft tissues: Negative.  Disc levels:  T11-12: Mild disc edema.  No stenosis.  T12-L1:  Normal disc space and facets. No spinal canal or neuroforaminal stenosis.  L1-L2: Disc desiccation with mild disc bulge and mild facet hypertrophy. Severe right neural foraminal stenosis. No spinal canal stenosis.  L2-L3: Disc desiccation with mild bulge and severe facet hypertrophy. Mild spinal canal stenosis. Moderate bilateral neural foraminal stenosis.  L3-L4: Disc desiccation and small bulge with severe facet hypertrophy. No spinal canal stenosis. Severe left and moderate right neural foraminal stenosis.  L4-L5: Severe bilateral facet hypertrophy and mild disc bulge. No spinal canal stenosis. Mild bilateral foraminal stenosis.  L5-S1: Severe facet hypertrophy and small disc bulge. No spinal canal or neural foraminal stenosis.  Visualized sacrum: Normal.  IMPRESSION: 1. Severe multilevel facet arthrosis which may be a source of local low back pain. 2. Severe left L3 and right L1 neural foraminal stenosis. 3. Moderate bilateral L2 and right L3 neural foraminal stenosis.   Electronically Signed   By: Deatra Robinson M.D.   On: 05/08/2019 02:08   PMFS History: Patient Active Problem List   Diagnosis Date Noted  . Elevated LDL cholesterol level 06/19/2019  . Mild intermittent asthma without complication 06/19/2019  . Pain in left ankle and joints of left foot 06/07/2019  . Bilateral renal cysts 04/25/2019  . Bloating 04/22/2019  . Generalized abdominal pain 04/22/2019  . Mild alcohol use disorder 04/22/2019  . Pyrosis 04/22/2019  . Low back pain radiating to left leg 04/14/2019  . BMI 45.0-49.9, adult (HCC) 11/11/2018  . Bilateral primary osteoarthritis of knee 11/11/2018  . Family history of heart disease in male family member before age 37 06/17/2018  . Erectile dysfunction 06/17/2018  . Asthma   . Eosinophilia 04/16/2018  . HTN (hypertension) 04/23/2016  . Esophageal reflux 04/23/2016  . Morbid obesity (HCC) 04/23/2016  . Hypokalemia 04/23/2016  .  OSA (obstructive sleep apnea) 04/23/2016  . Personal history of noncompliance with medical treatment, presenting hazards to health 04/23/2016  . History of proteinuria syndrome 04/23/2016  . History of erectile dysfunction 04/23/2016  . History of pneumonia 04/23/2016  . CHF (congestive heart failure) (HCC) 04/23/2016  . Rectal fissure 03/24/2013  . Hemorrhoid 08/18/2012   Past Medical History:  Diagnosis Date  . Asthma    mild, no inhaler used  . Bilateral renal cysts 04/25/2019  . CHF (congestive heart failure) (HCC)   . DJD (degenerative joint disease)    has taken tramadol 50mg  q8h in past  . GERD (gastroesophageal reflux disease)   . Hyperlipidemia   . Hypertension   . Morbid obesity (HCC)    weighed in excess of 500 lbs at one point  . OSA (obstructive sleep apnea)    did not  tolerate CPAP, uses nasal cannula  . Proteinuria    GFR 94.12 February 2015    Family History  Problem Relation Age of Onset  . Heart disease Father        MI, DM2, HTN  . Hypertension Mother        56 DM2, HTN  . Cancer Mother   . Diabetes Brother   . Stroke Brother   . Colon cancer Neg Hx   . Esophageal cancer Neg Hx   . Inflammatory bowel disease Neg Hx   . Liver disease Neg Hx   . Pancreatic cancer Neg Hx   . Rectal cancer Neg Hx   . Stomach cancer Neg Hx     Past Surgical History:  Procedure Laterality Date  . COLONOSCOPY N/A 04/22/2013   Procedure: COLONOSCOPY;  Surgeon: Kandis Cocking, MD;  Location: [facility];  Service: General;  Laterality: N/A;  . leg surgery for fracture Right 2008 or 2009  . SPHINCTEROTOMY Left 04/22/2013   Procedure: Exam under anesthesia, left lateral internal SPHINCTEROTOMY;  Surgeon: Kandis Cocking, MD;  Location: [facility];  Service: General;  Laterality: Left;   Social History   Occupational History  . Not on file  Tobacco Use  . Smoking status: Former Smoker    Packs/day: 0.20    Years: 1.50    Pack years: 0.30    Types: Cigarettes    Quit date: 1982     Years since quitting: 39.0  . Smokeless tobacco: Never Used  Substance and Sexual Activity  . Alcohol use: Yes    Comment: 1/5 on weekend   . Drug use: No  . Sexual activity: Yes    Partners: Female

## 2019-10-22 ENCOUNTER — Other Ambulatory Visit: Payer: Self-pay | Admitting: Family Medicine

## 2019-10-22 DIAGNOSIS — I10 Essential (primary) hypertension: Secondary | ICD-10-CM

## 2019-11-17 ENCOUNTER — Other Ambulatory Visit: Payer: Self-pay | Admitting: Family Medicine

## 2019-11-17 DIAGNOSIS — K219 Gastro-esophageal reflux disease without esophagitis: Secondary | ICD-10-CM

## 2019-12-02 ENCOUNTER — Encounter: Payer: Self-pay | Admitting: Physical Medicine and Rehabilitation

## 2019-12-02 NOTE — Progress Notes (Signed)
MAXWEL MEADOWCROFT - 59 y.o. male MRN [id number]  Date of birth: 03-29-1961  Office Visit Note: Visit Date: 08/02/2019 PCP: Avanell Shackleton, NP-C Referred by: Avanell Shackleton, NP-C  Subjective: Chief Complaint  Patient presents with  . Lower Back - Pain   HPI:  FILIP LUTEN is a 59 y.o. male who comes in today For planned left L5-S1 interlaminar epidural steroid injection.  The patient has failed conservative care including home exercise, medications, time and activity modification.  This injection will be diagnostic and hopefully therapeutic.  Please see requesting physician notes for further details and justification.   ROS Otherwise per HPI.  Assessment & Plan: Visit Diagnoses:  1. Lumbar radiculopathy     Plan: No additional findings.   Meds & Orders:  Meds ordered this encounter  Medications  . methylPREDNISolone acetate (DEPO-MEDROL) injection 80 mg    Orders Placed This Encounter  Procedures  . XR C-ARM NO REPORT  . Epidural Steroid injection    Follow-up: No follow-ups on file.   Procedures: No procedures performed  Lumbar Epidural Steroid Injection - Interlaminar Approach with Fluoroscopic Guidance  Patient: [name]      Date of Birth: 1960/11/25 MRN: [id number] PCP: Avanell Shackleton, NP-C      Visit Date: 08/02/2019   Universal Protocol:     Consent Given By: the patient  Position: PRONE  Additional Comments: Vital signs were monitored before and after the procedure. Patient was prepped and draped in the usual sterile fashion. The correct patient, procedure, and site was verified.   Injection Procedure Details:  Procedure Site One Meds Administered:  Meds ordered this encounter  Medications  . methylPREDNISolone acetate (DEPO-MEDROL) injection 80 mg     Laterality: Left  Location/Site:  L5-S1  Needle size: 20 G  Needle type: Tuohy  Needle Placement: Paramedian epidural  Findings:   -Comments: Excellent flow of contrast into  the epidural space.  Procedure Details: Using a paramedian approach from the side mentioned above, the region overlying the inferior lamina was localized under fluoroscopic visualization and the soft tissues overlying this structure were infiltrated with 4 ml. of 1% Lidocaine without Epinephrine. The Tuohy needle was inserted into the epidural space using a paramedian approach.   The epidural space was localized using loss of resistance along with lateral and bi-planar fluoroscopic views.  After negative aspirate for air, blood, and CSF, a 2 ml. volume of Isovue-250 was injected into the epidural space and the flow of contrast was observed. Radiographs were obtained for documentation purposes.    The injectate was administered into the level noted above.   Additional Comments:  The patient tolerated the procedure well Dressing: 2 x 2 sterile gauze and Band-Aid    Post-procedure details: Patient was observed during the procedure. Post-procedure instructions were reviewed.  Patient left the clinic in stable condition.    Clinical History: MRI LUMBAR SPINE WITHOUT CONTRAST  TECHNIQUE: Multiplanar, multisequence MR imaging of the lumbar spine was performed. No intravenous contrast was administered.  COMPARISON:  None.  FINDINGS: Segmentation:  Standard.  Alignment:  Physiologic.  Vertebrae:  No fracture, evidence of discitis, or bone lesion.  Conus medullaris and cauda equina: Conus extends to the L1 level. Conus and cauda equina appear normal.  Paraspinal and other soft tissues: Negative.  Disc levels:  T11-12: Mild disc edema.  No stenosis.  T12-L1: Normal disc space and facets. No spinal canal or neuroforaminal stenosis.  L1-L2: Disc desiccation with mild disc  bulge and mild facet hypertrophy. Severe right neural foraminal stenosis. No spinal canal stenosis.  L2-L3: Disc desiccation with mild bulge and severe facet hypertrophy. Mild spinal canal  stenosis. Moderate bilateral neural foraminal stenosis.  L3-L4: Disc desiccation and small bulge with severe facet hypertrophy. No spinal canal stenosis. Severe left and moderate right neural foraminal stenosis.  L4-L5: Severe bilateral facet hypertrophy and mild disc bulge. No spinal canal stenosis. Mild bilateral foraminal stenosis.  L5-S1: Severe facet hypertrophy and small disc bulge. No spinal canal or neural foraminal stenosis.  Visualized sacrum: Normal.  IMPRESSION: 1. Severe multilevel facet arthrosis which may be a source of local low back pain. 2. Severe left L3 and right L1 neural foraminal stenosis. 3. Moderate bilateral L2 and right L3 neural foraminal stenosis.   Electronically Signed   By: Ulyses Jarred M.D.   On: 05/08/2019 02:08     Objective:  VS:  HT:    WT:   BMI:     BP:(!) 177/108  HR:65bpm  TEMP: ( )  RESP:  Physical Exam  Ortho Exam Imaging: No results found.

## 2019-12-02 NOTE — Progress Notes (Signed)
[name] - 59 y.o. male MRN [id number]  Date of birth: 1961-06-28  Office Visit Note: Visit Date: 07/27/2019 PCP: Avanell Shackleton, NP-C Referred by: Avanell Shackleton, NP-C  Subjective: Chief Complaint  Patient presents with  . Lower Back - Pain  . Left Leg - Pain  . Left Foot - Pain, Weakness   HPI: [name] is a 59 y.o. male who comes in today For eval ration management of chronic worsening low back pain with exacerbation of hip and leg pain.  He comes in today at the request of Dr. Aldean Baker.  He is also seeing Dr. Norlene Campbell in our office both from an orthopedic standpoint.  He complains of chronic history of back pain off and on exacerbations.  He reports current exacerbation from end of July 2020.  He is having pain across the lower back mostly left more than right refers into the left buttock and left leg down to the left foot.  There was some thought on the evaluation that he potentially was a candidate for radiofrequency ablation do the amount of arthritis he has on imaging.  He does have mainly radicular pain.  The pain is intermittent and sharp worse with walking and standing better at rest and with medication.  More consistent with claudication.  Average pain is a 9 out of 10.  His case is complicated by congestive heart failure and obesity.  Review of Systems  Musculoskeletal: Positive for back pain and joint pain.       Left radicular leg pain  All other systems reviewed and are negative.  Otherwise per HPI.  Assessment & Plan: Visit Diagnoses:  1. Spondylosis without myelopathy or radiculopathy, lumbar region   2. Lumbar radiculopathy   3. Chronic bilateral low back pain with left-sided sciatica     Plan: Findings:  Chronic worsening severe history of axial back pain with pain in the left buttock and left leg down to the foot with paresthesia.  Exacerbation of this since July but off-and-on history of back pain.  Patient is morbidly obese.  He has  tried and failed medication management as well as activity modification and time.  He has had different anti-inflammatories including prednisone.  MRI is interesting that he mainly has facet arthropathy most severe at L4-5 but without any sign of central stenosis.  May be some lateral recess narrowing.  He could be a candidate for facet joint block/medial branch block for his back pain and potential for radiofrequency ablation and we went over this at length today spending over 30 minutes talking about his back condition and treatments.  I think we have to start with epidural injection to see if we can see if his radicular type pain is relieved with that so that we can distinguish between the two.  We should be able to justify facet joint blocks and ablation if we do get his leg pain under control and those are 2 different issues from a spine standpoint.  We will see him back for the epidural injection.  This would be left L5-S1 interlaminar injection.  Depending on relief again would look at diagnostic medial branch blocks.    Meds & Orders: No orders of the defined types were placed in this encounter.  No orders of the defined types were placed in this encounter.   Follow-up: Return for Left L5-S1 interlaminar epidural steroid injection.   Procedures: No procedures performed  No notes on file   Clinical History:  MRI LUMBAR SPINE WITHOUT CONTRAST  TECHNIQUE: Multiplanar, multisequence MR imaging of the lumbar spine was performed. No intravenous contrast was administered.  COMPARISON:  None.  FINDINGS: Segmentation:  Standard.  Alignment:  Physiologic.  Vertebrae:  No fracture, evidence of discitis, or bone lesion.  Conus medullaris and cauda equina: Conus extends to the L1 level. Conus and cauda equina appear normal.  Paraspinal and other soft tissues: Negative.  Disc levels:  T11-12: Mild disc edema.  No stenosis.  T12-L1: Normal disc space and facets. No spinal canal  or neuroforaminal stenosis.  L1-L2: Disc desiccation with mild disc bulge and mild facet hypertrophy. Severe right neural foraminal stenosis. No spinal canal stenosis.  L2-L3: Disc desiccation with mild bulge and severe facet hypertrophy. Mild spinal canal stenosis. Moderate bilateral neural foraminal stenosis.  L3-L4: Disc desiccation and small bulge with severe facet hypertrophy. No spinal canal stenosis. Severe left and moderate right neural foraminal stenosis.  L4-L5: Severe bilateral facet hypertrophy and mild disc bulge. No spinal canal stenosis. Mild bilateral foraminal stenosis.  L5-S1: Severe facet hypertrophy and small disc bulge. No spinal canal or neural foraminal stenosis.  Visualized sacrum: Normal.  IMPRESSION: 1. Severe multilevel facet arthrosis which may be a source of local low back pain. 2. Severe left L3 and right L1 neural foraminal stenosis. 3. Moderate bilateral L2 and right L3 neural foraminal stenosis.   Electronically Signed   By: Deatra Robinson M.D.   On: 05/08/2019 02:08   He reports that he quit smoking about 39 years ago. His smoking use included cigarettes. He has a 0.30 pack-year smoking history. He has never used smokeless tobacco. No results for input(s): HGBA1C, LABURIC in the last 8760 hours.  Objective:  VS:  HT:5\' 8"  (172.7 cm)   WT:300 lb (136.1 kg)  BMI:45.63    BP:(!) 164/100  HR:72bpm  TEMP: ( )  RESP:  Physical Exam Vitals and nursing note reviewed.  Constitutional:      General: He is not in acute distress.    Appearance: Normal appearance. He is well-developed. He is obese.  HENT:     Head: Normocephalic and atraumatic.  Eyes:     Conjunctiva/sclera: Conjunctivae normal.     Pupils: Pupils are equal, round, and reactive to light.  Cardiovascular:     Rate and Rhythm: Normal rate.     Pulses: Normal pulses.     Heart sounds: Normal heart sounds.  Pulmonary:     Effort: Pulmonary effort is normal. No  respiratory distress.  Musculoskeletal:     Cervical back: Normal range of motion and neck supple. No rigidity.     Right lower leg: No edema.     Left lower leg: No edema.     Comments: Equivocally positive slump test on the left.  No pain with hip rotation on either side.  He does have pain with extension and facet loading of the lumbar spine consistent with his back pain.  He has no clonus.  Skin:    General: Skin is warm and dry.     Findings: No erythema or rash.  Neurological:     General: No focal deficit present.     Mental Status: He is alert and oriented to person, place, and time.     Sensory: No sensory deficit.     Coordination: Coordination normal.     Gait: Gait normal.  Psychiatric:        Mood and Affect: Mood normal.  Behavior: Behavior normal.     Ortho Exam Imaging: No results found.  Past Medical/Family/Surgical/Social History: Medications & Allergies reviewed per EMR, new medications updated. Patient Active Problem List   Diagnosis Date Noted  . Elevated LDL cholesterol level 06/19/2019  . Mild intermittent asthma without complication 54/27/0623  . Pain in left ankle and joints of left foot 06/07/2019  . Bilateral renal cysts 04/25/2019  . Bloating 04/22/2019  . Generalized abdominal pain 04/22/2019  . Mild alcohol use disorder 04/22/2019  . Pyrosis 04/22/2019  . Low back pain radiating to left leg 04/14/2019  . BMI 45.0-49.9, adult ([place]) 11/11/2018  . Bilateral primary osteoarthritis of knee 11/11/2018  . Family history of heart disease in male family member before age 39 06/17/2018  . Erectile dysfunction 06/17/2018  . Asthma   . Eosinophilia 04/16/2018  . HTN (hypertension) 04/23/2016  . Esophageal reflux 04/23/2016  . Morbid obesity ([place]) 04/23/2016  . Hypokalemia 04/23/2016  . OSA (obstructive sleep apnea) 04/23/2016  . Personal history of noncompliance with medical treatment, presenting hazards to health 04/23/2016  . History of  proteinuria syndrome 04/23/2016  . History of erectile dysfunction 04/23/2016  . History of pneumonia 04/23/2016  . CHF (congestive heart failure) ([place]) 04/23/2016  . Rectal fissure 03/24/2013  . Hemorrhoid 08/18/2012   Past Medical History:  Diagnosis Date  . Asthma    mild, no inhaler used  . Bilateral renal cysts 04/25/2019  . CHF (congestive heart failure) ([place])   . DJD (degenerative joint disease)    has taken tramadol 50mg  q8h in past  . GERD (gastroesophageal reflux disease)   . Hyperlipidemia   . Hypertension   . Morbid obesity ([place])    weighed in excess of 500 lbs at one point  . OSA (obstructive sleep apnea)    did not tolerate CPAP, uses nasal cannula  . Proteinuria    GFR 94.12 February 2015   Family History  Problem Relation Age of Onset  . Heart disease Father        MI, DM2, HTN  . Hypertension Mother        89 DM2, HTN  . Cancer Mother   . Diabetes Brother   . Stroke Brother   . Colon cancer Neg Hx   . Esophageal cancer Neg Hx   . Inflammatory bowel disease Neg Hx   . Liver disease Neg Hx   . Pancreatic cancer Neg Hx   . Rectal cancer Neg Hx   . Stomach cancer Neg Hx    Past Surgical History:  Procedure Laterality Date  . COLONOSCOPY N/A 04/22/2013   Procedure: COLONOSCOPY;  Surgeon: Shann Medal, MD;  Location: [facility];  Service: General;  Laterality: N/A;  . leg surgery for fracture Right 2008 or 2009  . SPHINCTEROTOMY Left 04/22/2013   Procedure: Exam under anesthesia, left lateral internal SPHINCTEROTOMY;  Surgeon: Shann Medal, MD;  Location: [facility];  Service: General;  Laterality: Left;   Social History   Occupational History  . Not on file  Tobacco Use  . Smoking status: Former Smoker    Packs/day: 0.20    Years: 1.50    Pack years: 0.30    Types: Cigarettes    Quit date: 1982    Years since quitting: 39.2  . Smokeless tobacco: Never Used  Substance and Sexual Activity  . Alcohol use: Yes    Comment: 1/5 on weekend   . Drug use: No    . Sexual activity: Yes  Partners: Female

## 2019-12-02 NOTE — Procedures (Signed)
Lumbar Epidural Steroid Injection - Interlaminar Approach with Fluoroscopic Guidance  Patient: [name]      Date of Birth: [birth year] MRN: [id number] PCP: Avanell Shackleton, NP-C      Visit Date: 08/02/2019   Universal Protocol:     Consent Given By: the patient  Position: PRONE  Additional Comments: Vital signs were monitored before and after the procedure. Patient was prepped and draped in the usual sterile fashion. The correct patient, procedure, and site was verified.   Injection Procedure Details:  Procedure Site One Meds Administered:  Meds ordered this encounter  Medications  . methylPREDNISolone acetate (DEPO-MEDROL) injection 80 mg     Laterality: Left  Location/Site:  L5-S1  Needle size: 20 G  Needle type: Tuohy  Needle Placement: Paramedian epidural  Findings:   -Comments: Excellent flow of contrast into the epidural space.  Procedure Details: Using a paramedian approach from the side mentioned above, the region overlying the inferior lamina was localized under fluoroscopic visualization and the soft tissues overlying this structure were infiltrated with 4 ml. of 1% Lidocaine without Epinephrine. The Tuohy needle was inserted into the epidural space using a paramedian approach.   The epidural space was localized using loss of resistance along with lateral and bi-planar fluoroscopic views.  After negative aspirate for air, blood, and CSF, a 2 ml. volume of Isovue-250 was injected into the epidural space and the flow of contrast was observed. Radiographs were obtained for documentation purposes.    The injectate was administered into the level noted above.   Additional Comments:  The patient tolerated the procedure well Dressing: 2 x 2 sterile gauze and Band-Aid    Post-procedure details: Patient was observed during the procedure. Post-procedure instructions were reviewed.  Patient left the clinic in stable condition.

## 2019-12-07 ENCOUNTER — Other Ambulatory Visit: Payer: Self-pay | Admitting: Family Medicine

## 2019-12-07 DIAGNOSIS — E876 Hypokalemia: Secondary | ICD-10-CM

## 2019-12-13 ENCOUNTER — Ambulatory Visit: Payer: Medicare HMO | Admitting: Orthopaedic Surgery

## 2020-01-17 ENCOUNTER — Other Ambulatory Visit: Payer: Self-pay | Admitting: Cardiology

## 2020-01-17 DIAGNOSIS — I10 Essential (primary) hypertension: Secondary | ICD-10-CM

## 2020-01-26 ENCOUNTER — Other Ambulatory Visit: Payer: Self-pay | Admitting: Cardiology

## 2020-01-26 DIAGNOSIS — I10 Essential (primary) hypertension: Secondary | ICD-10-CM

## 2020-01-30 ENCOUNTER — Other Ambulatory Visit: Payer: Self-pay | Admitting: Family Medicine

## 2020-01-30 DIAGNOSIS — I10 Essential (primary) hypertension: Secondary | ICD-10-CM

## 2020-02-10 ENCOUNTER — Other Ambulatory Visit: Payer: Self-pay | Admitting: Family Medicine

## 2020-02-10 NOTE — Telephone Encounter (Signed)
Has upcoming appt °

## 2020-02-14 ENCOUNTER — Other Ambulatory Visit: Payer: Self-pay | Admitting: Cardiology

## 2020-02-14 DIAGNOSIS — I10 Essential (primary) hypertension: Secondary | ICD-10-CM

## 2020-02-15 ENCOUNTER — Other Ambulatory Visit: Payer: Self-pay

## 2020-02-15 ENCOUNTER — Encounter: Payer: Self-pay | Admitting: Family Medicine

## 2020-02-15 ENCOUNTER — Ambulatory Visit (INDEPENDENT_AMBULATORY_CARE_PROVIDER_SITE_OTHER): Payer: Medicare HMO | Admitting: Family Medicine

## 2020-02-15 VITALS — BP 130/90 | HR 71 | Wt 324.0 lb

## 2020-02-15 DIAGNOSIS — I10 Essential (primary) hypertension: Secondary | ICD-10-CM | POA: Diagnosis not present

## 2020-02-15 DIAGNOSIS — R208 Other disturbances of skin sensation: Secondary | ICD-10-CM | POA: Diagnosis not present

## 2020-02-15 DIAGNOSIS — M545 Low back pain, unspecified: Secondary | ICD-10-CM

## 2020-02-15 DIAGNOSIS — E7800 Pure hypercholesterolemia, unspecified: Secondary | ICD-10-CM

## 2020-02-15 DIAGNOSIS — Z6841 Body Mass Index (BMI) 40.0 and over, adult: Secondary | ICD-10-CM | POA: Diagnosis not present

## 2020-02-15 DIAGNOSIS — M79605 Pain in left leg: Secondary | ICD-10-CM | POA: Diagnosis not present

## 2020-02-15 DIAGNOSIS — Z79899 Other long term (current) drug therapy: Secondary | ICD-10-CM | POA: Diagnosis not present

## 2020-02-15 NOTE — Patient Instructions (Signed)
Your blood pressure is not quite in goal range today.  It is 130/90 and recommended blood pressure reading is less than 130/80. I recommend cutting back on fried food, fast food, foods high in sodium.  Call and follow-up with Dr[facility]ina [facility] as we discussed.  Continue on your statin and I will be in touch once I get your lab results.    Cholesterol Content in Foods Cholesterol is a waxy, fat-like substance that helps to carry fat in the blood. The body needs cholesterol in small amounts, but too much cholesterol can cause damage to the arteries and heart. Most people should eat less than 200 milligrams (mg) of cholesterol a day. Foods with cholesterol  Cholesterol is found in animal-based foods, such as meat, seafood, and dairy. Generally, low-fat dairy and lean meats have less cholesterol than full-fat dairy and fatty meats. The milligrams of cholesterol per serving (mg per serving) of common cholesterol-containing foods are listed below. Meat and other proteins  Egg -- one large whole egg has 186 mg.  Veal shank -- 4 oz has 141 mg.  Lean ground [place] (93% lean) -- 4 oz has 118 mg.  Fat-trimmed lamb loin -- 4 oz has 106 mg.  Lean ground beef (90% lean) -- 4 oz has 100 mg.  Lobster -- 3.5 oz has 90 mg.  Pork loin chops -- 4 oz has 86 mg.  Canned salmon -- 3.5 oz has 83 mg.  Fat-trimmed beef top loin -- 4 oz has 78 mg.  Frankfurter -- 1 frank (3.5 oz) has 77 mg.  Crab -- 3.5 oz has 71 mg.  Roasted chicken without skin, white meat -- 4 oz has 66 mg.  Light bologna -- 2 oz has 45 mg.  Deli-cut [place] -- 2 oz has 31 mg.  Canned tuna -- 3.5 oz has 31 mg.  Berniece Salines -- 1 oz has 29 mg.  Oysters and mussels (raw) -- 3.5 oz has 25 mg.  Mackerel -- 1 oz has 22 mg.  Trout -- 1 oz has 20 mg.  Pork sausage -- 1 link (1 oz) has 17 mg.  Salmon -- 1 oz has 16 mg.  Tilapia -- 1 oz has 14 mg. Dairy  Soft-serve ice cream --  cup (4 oz) has 103 mg.  Whole-milk  yogurt -- 1 cup (8 oz) has 29 mg.  Cheddar cheese -- 1 oz has 28 mg.  American cheese -- 1 oz has 28 mg.  Whole milk -- 1 cup (8 oz) has 23 mg.  2% milk -- 1 cup (8 oz) has 18 mg.  Cream cheese -- 1 tablespoon (Tbsp) has 15 mg.  Cottage cheese --  cup (4 oz) has 14 mg.  Low-fat (1%) milk -- 1 cup (8 oz) has 10 mg.  Sour cream -- 1 Tbsp has 8.5 mg.  Low-fat yogurt -- 1 cup (8 oz) has 8 mg.  Nonfat Greek yogurt -- 1 cup (8 oz) has 7 mg.  Half-and-half cream -- 1 Tbsp has 5 mg. Fats and oils  Cod liver oil -- 1 tablespoon (Tbsp) has 82 mg.  Butter -- 1 Tbsp has 15 mg.  Lard -- 1 Tbsp has 14 mg.  Bacon grease -- 1 Tbsp has 14 mg.  Mayonnaise -- 1 Tbsp has 5-10 mg.  Margarine -- 1 Tbsp has 3-10 mg. Exact amounts of cholesterol in these foods may vary depending on specific ingredients and brands. Foods without cholesterol Most plant-based foods do not have cholesterol  unless you combine them with a food that has cholesterol. Foods without cholesterol include:  Grains and cereals.  Vegetables.  Fruits.  Vegetable oils, such as olive, canola, and sunflower oil.  Legumes, such as peas, beans, and lentils.  Nuts and seeds.  Egg whites. Summary  The body needs cholesterol in small amounts, but too much cholesterol can cause damage to the arteries and heart.  Most people should eat less than 200 milligrams (mg) of cholesterol a day. This information is not intended to replace advice given to you by your health care provider. Make sure you discuss any questions you have with your health care provider. Document Revised: 08/07/2017 Document Reviewed: 04/21/2017 Elsevier Patient Education  2020 [employer].

## 2020-02-15 NOTE — Progress Notes (Signed)
   Subjective:    Patient ID: [name], male    DOB: Jun 10, 1961, 59 y.o.   MRN: [id number]  HPI Chief Complaint  Patient presents with  . 6 month follow-up    6 month follow-up on cholesterol   He is here today to follow-up on hyperlipidemia.  We increased his statin dose in December 2020.  Counseling was done also a that time regarding his increased 10-year ASCVD risk. Reports taking his statin daily without any side effects.  States his diet has not been good. He eats hamburgers and potato chips 2 to 3 days a week. No regular exercise. States he is having issues with numbness and tingling on the sole of his left foot.  This has been ongoing and he has been seen [facility] for [facility]iculopathy.   He has had a lumbar injection by Dr. Alvester Morin in November 2020.  He reported improvement in mobility of his toes from that injection.  Has not followed up recently with them.  He has gained weight since the last visit.  Denies fever, chills, dizziness, chest pain, palpitations, shortness of breath, abdominal pain, N/V/D, urinary symptoms, LE edema.    Reviewed allergies, medications, past medical, surgical, family, and social history.    Review of Systems Pertinent positives and negatives in the history of present illness.     Objective:   Physical Exam BP 130/90   Pulse 71   Wt (!) 324 lb (147 kg)   BMI 49.26 kg/m   Left ankle and dorsum of left foot with normal sensation, range of motion and strength.  He has good pulses and capillary refill in his left foot. Significantly decreased sensation to the plantar surface.       Assessment & Plan:  Elevated LDL cholesterol level - Plan: Lipid panel -continue statin. Check lipid panel and increase statin dose if needed. Recommend healthier diet low in fat and cholesterol.   Low back pain radiating to left leg -recommend follow up with Dr. Alvester Morin   Essential hypertension - Plan: CBC with Differential/Platelet, Comprehensive  metabolic panel -BP is elevated. Discussed checking it at home. Continue medication. Reduce sodium in his diet.   Medication management - Plan: CBC with Differential/Platelet, Comprehensive metabolic panel, Lipid panel -follow up pending labs. Adjust statin if needed  Decreased sensation of foot - Plan: Vitamin B12 -appears to be related to lumbar pain and I recommend follow up with Dr. Alvester Morin.   Morbid obesity (HCC) -cut back on fast food, fatty foods and increase activity as tolerated.

## 2020-02-16 LAB — CBC WITH DIFFERENTIAL/PLATELET
Basophils Absolute: 0.1 10*3/uL (ref 0.0–0.2)
Basos: 1 %
EOS (ABSOLUTE): 0.8 10*3/uL — ABNORMAL HIGH (ref 0.0–0.4)
Eos: 10 %
Hematocrit: 44.6 % (ref 37.5–51.0)
Hemoglobin: 14.6 g/dL (ref 13.0–17.7)
Immature Grans (Abs): 0 10*3/uL (ref 0.0–0.1)
Immature Granulocytes: 0 %
Lymphocytes Absolute: 1.3 10*3/uL (ref 0.7–3.1)
Lymphs: 16 %
MCH: 30.5 pg (ref 26.6–33.0)
MCHC: 32.7 g/dL (ref 31.5–35.7)
MCV: 93 fL (ref 79–97)
Monocytes Absolute: 0.4 10*3/uL (ref 0.1–0.9)
Monocytes: 6 %
Neutrophils Absolute: 5.4 10*3/uL (ref 1.4–7.0)
Neutrophils: 67 %
Platelets: 243 10*3/uL (ref 150–450)
RBC: 4.78 x10E6/uL (ref 4.14–5.80)
RDW: 14.1 % (ref 11.6–15.4)
WBC: 8 10*3/uL (ref 3.4–10.8)

## 2020-02-16 LAB — COMPREHENSIVE METABOLIC PANEL
ALT: 15 IU/L (ref 0–44)
AST: 23 IU/L (ref 0–40)
Albumin/Globulin Ratio: 1.5 (ref 1.2–2.2)
Albumin: 4.1 g/dL (ref 3.8–4.9)
Alkaline Phosphatase: 58 IU/L (ref 48–121)
BUN/Creatinine Ratio: 15 (ref 9–20)
BUN: 17 mg/dL (ref 6–24)
Bilirubin Total: 0.4 mg/dL (ref 0.0–1.2)
CO2: 24 mmol/L (ref 20–29)
Calcium: 9.6 mg/dL (ref 8.7–10.2)
Chloride: 101 mmol/L (ref 96–106)
Creatinine, Ser: 1.17 mg/dL (ref 0.76–1.27)
GFR calc Af Amer: 79 mL/min/{1.73_m2} (ref >59)
GFR calc non Af Amer: 68 mL/min/{1.73_m2} (ref >59)
Globulin, Total: 2.7 g/dL (ref 1.5–4.5)
Glucose: 81 mg/dL (ref 65–99)
Potassium: 4.6 mmol/L (ref 3.5–5.2)
Sodium: 139 mmol/L (ref 134–144)
Total Protein: 6.8 g/dL (ref 6.0–8.5)

## 2020-02-16 LAB — LIPID PANEL
Chol/HDL Ratio: 2.7 ratio (ref 0.0–5.0)
Cholesterol, Total: 175 mg/dL (ref 100–199)
HDL: 66 mg/dL (ref >39)
LDL Chol Calc (NIH): 95 mg/dL (ref 0–99)
Triglycerides: 74 mg/dL (ref 0–149)
VLDL Cholesterol Cal: 14 mg/dL (ref 5–40)

## 2020-02-16 LAB — VITAMIN B12: Vitamin B-12: 434 pg/mL (ref 232–1245)

## 2020-02-16 NOTE — Progress Notes (Signed)
His labs are fine. I recommend taking a multi-vitamin with vitamin B12 if he is not already. His B12 is low normal.

## 2020-02-18 ENCOUNTER — Other Ambulatory Visit: Payer: Self-pay | Admitting: Family Medicine

## 2020-02-18 DIAGNOSIS — K219 Gastro-esophageal reflux disease without esophagitis: Secondary | ICD-10-CM

## 2020-03-16 ENCOUNTER — Other Ambulatory Visit: Payer: Self-pay | Admitting: Family Medicine

## 2020-03-16 ENCOUNTER — Other Ambulatory Visit: Payer: Self-pay | Admitting: Cardiology

## 2020-03-16 DIAGNOSIS — I10 Essential (primary) hypertension: Secondary | ICD-10-CM

## 2020-03-16 DIAGNOSIS — E876 Hypokalemia: Secondary | ICD-10-CM

## 2020-03-24 ENCOUNTER — Other Ambulatory Visit: Payer: Self-pay | Admitting: Cardiology

## 2020-03-24 DIAGNOSIS — I10 Essential (primary) hypertension: Secondary | ICD-10-CM

## 2020-04-26 ENCOUNTER — Telehealth: Payer: Self-pay | Admitting: Cardiology

## 2020-04-26 DIAGNOSIS — I10 Essential (primary) hypertension: Secondary | ICD-10-CM

## 2020-04-26 MED ORDER — FUROSEMIDE 80 MG PO TABS: ORAL_TABLET | ORAL | 0 refills | Status: DC

## 2020-04-26 NOTE — Telephone Encounter (Signed)
Pt's medication was sent to pt's pharmacy as requested. Confirmation received.  °

## 2020-04-26 NOTE — Telephone Encounter (Signed)
*  STAT* If patient is at the pharmacy, call can be transferred to refill team.   1. Which medications need to be refilled? (please list name of each medication and dose if known) furosemide (LASIX) 80 MG tablet  2. Which pharmacy/location (including street and city if local pharmacy) is medication to be sent to? [id number] [place], [place] - [address]  3. Do they need a 30 day or 90 day supply? 90

## 2020-04-30 ENCOUNTER — Other Ambulatory Visit: Payer: Self-pay | Admitting: Family Medicine

## 2020-04-30 DIAGNOSIS — I10 Essential (primary) hypertension: Secondary | ICD-10-CM

## 2020-05-03 ENCOUNTER — Other Ambulatory Visit: Payer: Self-pay | Admitting: Family Medicine

## 2020-05-15 ENCOUNTER — Other Ambulatory Visit: Payer: Self-pay | Admitting: Family Medicine

## 2020-05-15 DIAGNOSIS — K219 Gastro-esophageal reflux disease without esophagitis: Secondary | ICD-10-CM

## 2020-05-18 ENCOUNTER — Other Ambulatory Visit: Payer: Self-pay | Admitting: Cardiology

## 2020-05-18 DIAGNOSIS — I10 Essential (primary) hypertension: Secondary | ICD-10-CM

## 2020-06-07 ENCOUNTER — Encounter: Payer: Self-pay | Admitting: Cardiology

## 2020-06-07 ENCOUNTER — Ambulatory Visit (INDEPENDENT_AMBULATORY_CARE_PROVIDER_SITE_OTHER): Payer: Medicare HMO | Admitting: Cardiology

## 2020-06-07 ENCOUNTER — Other Ambulatory Visit: Payer: Self-pay

## 2020-06-07 DIAGNOSIS — I10 Essential (primary) hypertension: Secondary | ICD-10-CM

## 2020-06-07 DIAGNOSIS — J449 Chronic obstructive pulmonary disease, unspecified: Secondary | ICD-10-CM | POA: Diagnosis not present

## 2020-06-07 DIAGNOSIS — Z79899 Other long term (current) drug therapy: Secondary | ICD-10-CM | POA: Diagnosis not present

## 2020-06-07 DIAGNOSIS — E7800 Pure hypercholesterolemia, unspecified: Secondary | ICD-10-CM | POA: Diagnosis not present

## 2020-06-07 MED ORDER — ATORVASTATIN CALCIUM 40 MG PO TABS
40.0000 mg | ORAL_TABLET | Freq: Every day | ORAL | 3 refills | Status: DC
Start: 2020-06-07 — End: 2021-06-12

## 2020-06-07 NOTE — Progress Notes (Signed)
Cardiology Office Note:    Date:  06/07/2020   ID:  [name], DOB 1961-04-03, MRN [id number]  PCP:  Avanell Shackleton, NP-C  [facility] Cardiologist:  Donato Schultz, MD  [facility] Electrophysiologist:  None   Referring MD: Avanell Shackleton, NP-C    History of Present Illness:    [name] is a 59 y.o. male here for the follow-up of family history of coronary disease, father died at age 75 from myocardial infarction. Has COPD but he is a non-smoker. Previously has had sharp sided left-sided chest pain. Needlelike.  We performed a Lexiscan stress test  ECHO 09/02/18  - Left ventricle: The cavity size was normal. Wall thickness was  increased in a pattern of mild LVH. Systolic function was  vigorous. The estimated ejection fraction was in the range of 65%  to 70%. Wall motion was normal; there were no regional wall  motion abnormalities. Left ventricular diastolic function  parameters were normal.  - Atrial septum: No defect or patent foramen ovale was identified.   Past Medical History:  Diagnosis Date  . Asthma    mild, no inhaler used  . Bilateral renal cysts 04/25/2019  . CHF (congestive heart failure) (HCC)   . DJD (degenerative joint disease)    has taken tramadol 50mg  q8h in past  . GERD (gastroesophageal reflux disease)   . Hyperlipidemia   . Hypertension   . Morbid obesity (HCC)    weighed in excess of 500 lbs at one point  . OSA (obstructive sleep apnea)    did not tolerate CPAP, uses nasal cannula  . Proteinuria    GFR 94.12 February 2015    Past Surgical History:  Procedure Laterality Date  . COLONOSCOPY N/A 04/22/2013   Procedure: COLONOSCOPY;  Surgeon: Kandis Cocking, MD;  Location: [facility];  Service: General;  Laterality: N/A;  . leg surgery for fracture Right 2008 or 2009  . SPHINCTEROTOMY Left 04/22/2013   Procedure: Exam under anesthesia, left lateral internal SPHINCTEROTOMY;  Surgeon: Kandis Cocking, MD;  Location: [facility];   Service: General;  Laterality: Left;    Current Medications: Current Meds  Medication Sig  . BYSTOLIC 10 MG tablet TAKE 1 TABLET BY MOUTH EVERY DAY IN THE MORNING  . furosemide (LASIX) 80 MG tablet TAKE 1/2TAB TWICE A DAY *MUST KEEP UPCOMING APPT  . KLOR-CON M20 20 MEQ tablet TAKE 1 TABLET BY MOUTH EVERY DAY  . omeprazole (PRILOSEC) 20 MG capsule TAKE 1 CAPSULE BY MOUTH EVERY DAY  . traMADol (ULTRAM) 50 MG tablet TAKE 1 TO 2 TABLETS BY MOUTH TWICE A DAY AS NEEDED FOR PAIN  . [DISCONTINUED] atorvastatin (LIPITOR) 20 MG tablet TAKE 1 TABLET BY MOUTH EVERY DAY     Allergies:   Tomato   Social History   Socioeconomic History  . Marital status: Single    Spouse name: Not on file  . Number of children: Not on file  . Years of education: Not on file  . Highest education level: Not on file  Occupational History  . Not on file  Tobacco Use  . Smoking status: Former Smoker    Packs/day: 0.20    Years: 1.50    Pack years: 0.30    Types: Cigarettes    Quit date: 1982    Years since quitting: 39.7  . Smokeless tobacco: Never Used  Vaping Use  . Vaping Use: Never used  Substance and Sexual Activity  . Alcohol use: Yes  Comment: 1/5 on weekend   . Drug use: No  . Sexual activity: Yes    Partners: Female  Other Topics Concern  . Not on file  Social History Narrative  . Not on file   Social Determinants of Health   Financial Resource Strain:   . Difficulty of Paying Living Expenses: Not on file  Food Insecurity:   . Worried About Programme researcher, broadcasting/film/video in the Last Year: Not on file  . Ran Out of Food in the Last Year: Not on file  Transportation Needs:   . Lack of Transportation (Medical): Not on file  . Lack of Transportation (Non-Medical): Not on file  Physical Activity:   . Days of Exercise per Week: Not on file  . Minutes of Exercise per Session: Not on file  Stress:   . Feeling of Stress : Not on file  Social Connections:   . Frequency of Communication with Friends  and Family: Not on file  . Frequency of Social Gatherings with Friends and Family: Not on file  . Attends Religious Services: Not on file  . Active Member of Clubs or Organizations: Not on file  . Attends Banker Meetings: Not on file  . Marital Status: Not on file     Family History: The patient's family history includes Cancer in his mother; Diabetes in his brother; Heart disease in his father; Hypertension in his mother; Stroke in his brother. There is no history of Colon cancer, Esophageal cancer, Inflammatory bowel disease, Liver disease, Pancreatic cancer, Rectal cancer, or Stomach cancer.  ROS:   Please see the history of present illness.     All other systems reviewed and are negative.  EKGs/Labs/Other Studies Reviewed:     EKG:  EKG is  ordered today.  The ekg ordered today demonstrates sinus rhythm 69.  Recent Labs: 02/15/2020: ALT 15; BUN 17; Creatinine, Ser 1.17; Hemoglobin 14.6; Platelets 243; Potassium 4.6; Sodium 139  Recent Lipid Panel    Component Value Date/Time   CHOL 175 02/15/2020 0910   TRIG 74 02/15/2020 0910   HDL 66 02/15/2020 0910   CHOLHDL 2.7 02/15/2020 0910   CHOLHDL 3.0 05/19/2017 0923   VLDL 27 11/07/2009 0257   LDLCALC 95 02/15/2020 0910   LDLCALC 108 (H) 05/19/2017 0923    Physical Exam:    VS:  BP 130/90   Pulse 69   Ht 5\' 8"  (1.727 m)   Wt (!) 322 lb (146.1 kg)   SpO2 95%   BMI 48.96 kg/m     Wt Readings from Last 3 Encounters:  06/07/20 (!) 322 lb (146.1 kg)  02/15/20 (!) 324 lb (147 kg)  09/13/19 300 lb (136.1 kg)     GEN:  Well nourished, well developed in no acute distress HEENT: Normal NECK: No JVD; No carotid bruits LYMPHATICS: No lymphadenopathy CARDIAC: RRR, no murmurs, rubs, gallops RESPIRATORY:  Clear to auscultation without rales, wheezing or rhonchi  ABDOMEN: Soft, non-tender, non-distended MUSCULOSKELETAL:  No edema; No deformity  SKIN: Warm and dry NEUROLOGIC:  Alert and oriented x 3 PSYCHIATRIC:   Normal affect   ASSESSMENT:    1. Morbid obesity (HCC)   2. Elevated LDL cholesterol level   3. Medication management   4. Essential hypertension    PLAN:    In order of problems listed above:  Atypical chest pain -No longer having issues.  Reassuring stress test and echocardiogram previously.  Does have occasional "stress pain ".  He is taking care of  his brother and mother.  Hyperlipidemia/family history of CAD -Last LDL 95, ALT 15, TSH 0.7, creatinine 1.2. -I would like to increase his atorvastatin from 20 to 40 mg.  This will place him on a high intensity dose given his strong family history with his father having myocardial infarction.   Medication Adjustments/Labs and Tests Ordered: Current medicines are reviewed at length with the patient today.  Concerns regarding medicines are outlined above.  Orders Placed This Encounter  Procedures  . Lipid panel  . EKG 12-Lead   Meds ordered this encounter  Medications  . atorvastatin (LIPITOR) 40 MG tablet    Sig: Take 1 tablet (40 mg total) by mouth daily.    Dispense:  90 tablet    Refill:  3    Patient Instructions  Medication Instructions:  The current medical regimen is effective;  continue present plan and medications.  *If you need a refill on your cardiac medications before your next appointment, please call your pharmacy*  Lab Work: Please have blood work in 3 months (Lipid)  If you have labs (blood work) drawn today and your tests are completely normal, you will receive your results only by: Marland Kitchen MyChart Message (if you have MyChart) OR . A paper copy in the mail If you have any lab test that is abnormal or we need to change your treatment, we will call you to review the results.  Follow-Up: At [facility], you and your health needs are our priority.  As part of our continuing mission to provide you with exceptional heart care, we have created designated Provider Care Teams.  These Care Teams include your  primary Cardiologist (physician) and Advanced Practice Providers (APPs -  Physician Assistants and Nurse Practitioners) who all work together to provide you with the care you need, when you need it.  We recommend signing up for the patient portal called "MyChart".  Sign up information is provided on this After Visit Summary.  MyChart is used to connect with patients for Virtual Visits (Telemedicine).  Patients are able to view lab/test results, encounter notes, upcoming appointments, etc.  Non-urgent messages can be sent to your provider as well.   To learn more about what you can do with MyChart, go to [email or web address].    Your next appointment:   12 month(s)  The format for your next appointment:   In Person  Provider:   Donato Schultz, MD  Thank you for choosing [facility]!!         Signed, Donato Schultz, MD  06/07/2020 4:51 PM    [facility]

## 2020-06-07 NOTE — Patient Instructions (Signed)
Medication Instructions:  The current medical regimen is effective;  continue present plan and medications.  *If you need a refill on your cardiac medications before your next appointment, please call your pharmacy*  Lab Work: Please have blood work in 3 months (Lipid)  If you have labs (blood work) drawn today and your tests are completely normal, you will receive your results only by: Marland Kitchen MyChart Message (if you have MyChart) OR . A paper copy in the mail If you have any lab test that is abnormal or we need to change your treatment, we will call you to review the results.  Follow-Up: At [facility], you and your health needs are our priority.  As part of our continuing mission to provide you with exceptional heart care, we have created designated Provider Care Teams.  These Care Teams include your primary Cardiologist (physician) and Advanced Practice Providers (APPs -  Physician Assistants and Nurse Practitioners) who all work together to provide you with the care you need, when you need it.  We recommend signing up for the patient portal called "MyChart".  Sign up information is provided on this After Visit Summary.  MyChart is used to connect with patients for Virtual Visits (Telemedicine).  Patients are able to view lab/test results, encounter notes, upcoming appointments, etc.  Non-urgent messages can be sent to your provider as well.   To learn more about what you can do with MyChart, go to [email or web address].    Your next appointment:   12 month(s)  The format for your next appointment:   In Person  Provider:   Donato Schultz, MD  Thank you for choosing [facility]!!

## 2020-06-08 ENCOUNTER — Other Ambulatory Visit: Payer: Self-pay | Admitting: Family Medicine

## 2020-06-08 DIAGNOSIS — E876 Hypokalemia: Secondary | ICD-10-CM

## 2020-06-16 ENCOUNTER — Other Ambulatory Visit: Payer: Self-pay | Admitting: Cardiology

## 2020-06-16 DIAGNOSIS — I10 Essential (primary) hypertension: Secondary | ICD-10-CM

## 2020-06-27 NOTE — Progress Notes (Signed)
[name] is a 59 y.o. male who presents for annual wellness visit, CPE  and follow-up on chronic medical conditions.  He has the following concerns:  Reports being stressed due to having to care for his brother and mother. States he has been getting tightness in the right upper shoulder and neck muscles and occasionally has a headache on that side.  States he is not interested in counseling to help manage stress. States he drinks alcohol at times to manage.   He was recently seen by his cardiologist for HL, HTN and morbid obesity. His statin was increased.  He will follow up there as recommended in December for repeat lipids.   OSA- not using CPAP due to patient preference.   Morbid obesity- he has gained weight since our last visit. States he has been stress eating.  I have referred him to [facility] in the past but he did not go.   He is currently single. No kids.    Drinks alcohol -dark rum. States he drinks 1 drink on weekdays usually and a fifth of rum on the weekends.  Denies smoking  No drug use.   No issues with asthma since 2009  Denies COPD   Immunization History  Administered Date(s) Administered  . Influenza-Unspecified 07/01/2011, 07/19/2012  . PFIZER SARS-COV-2 Vaccination 03/27/2020, 04/17/2020  . Pneumococcal Polysaccharide-23 11/03/2011  . Td 11/03/2011  . Tdap 02/16/2014   Last colonoscopy: 2019 Last PSA: 2020 Dentist: years ago Ophtho: in 2020 Exercise: sometimes but not often   Other doctors caring for patient include: Dr. Anne Fu- Cardiology Dr. Ophelia Charter- orthopedist  Dr. Alvester Morin- orthopedist  Dr. Meridee Score- GI  Dr. Marjory Lies- neurology   Pfizer vaccines with last one 04/17/2020    Depression screen:  See questionnaire below.     Depression screen [facility] 2/9 06/28/2020 06/20/2019 06/17/2018 08/13/2017  Decreased Interest 0 0 0 0  Down, Depressed, Hopeless 0 0 0 0  PHQ - 2 Score 0 0 0 0    Fall Screen: See Questionaire below.    Fall Risk  06/28/2020 06/20/2019 06/17/2018  Falls in the past year? 0 0 No  Number falls in past yr: 0 0 -  Injury with Fall? 0 0 -    ADL screen:  See questionnaire below.  Functional Status Survey: Is the patient deaf or have difficulty hearing?: No Does the patient have difficulty seeing, even when wearing glasses/contacts?: No Does the patient have difficulty concentrating, remembering, or making decisions?: No Does the patient have difficulty walking or climbing stairs?: Yes Does the patient have difficulty dressing or bathing?: No Does the patient have difficulty doing errands alone such as visiting a doctor's office or shopping?: No   End of Life Discussion: Patient does not have a living will and medical power of attorney Mother [name] will be his HCPOA. MOST form reviewed and signed today.   Review of Systems  Constitutional: -fever, -chills, -sweats, -unexpected weight change, -anorexia, -fatigue Allergy: -sneezing, -itching, -congestion Dermatology: denies changing moles, rash, lumps, new worrisome lesions ENT: -runny nose, -ear pain, -sore throat, -hoarseness, -sinus pain, -teeth pain, -tinnitus, -hearing loss, -epistaxis Cardiology:  -chest pain, -palpitations, -edema, -orthopnea, -paroxysmal nocturnal dyspnea Respiratory: -cough, -shortness of breath, -dyspnea on exertion, -wheezing, -hemoptysis Gastroenterology: -abdominal pain, -nausea, -vomiting, -diarrhea, -constipation, -blood in stool, -changes in bowel movement, -dysphagia Hematology: -bleeding or bruising problems Musculoskeletal: -arthralgias, -myalgias, -joint swelling, -back pain, -neck pain, -cramping, -gait changes Ophthalmology: -vision changes, -eye redness, -itching, -discharge Urology: -dysuria, -difficulty  urinating, -hematuria, -urinary frequency, -urgency, -incontinence Neurology: +headache, -weakness, -tingling, -numbness, -speech abnormality, -memory loss, -falls, -dizziness Psychology:   -depressed mood, -agitation, -sleep problems   PHYSICAL EXAM:  BP 130/80   Pulse 63   Ht 5' 7.25" (1.708 m)   Wt (!) 327 lb 9.6 oz (148.6 kg)   BMI 50.93 kg/m   General Appearance: Alert, cooperative, no distress, appears stated age Head: Normocephalic, without obvious abnormality, atraumatic Eyes: PERRL, conjunctiva/corneas clear, EOM's intact Ears: Normal TM's and external ear canals Nose: mask on  Throat: Mask on  Neck: Supple, no lymphadenopathy, thyroid:no enlargement/tenderness/nodules; no JVD Back: Spine nontender, ROM normal, no CVA tenderness Lungs: Clear to auscultation bilaterally without wheezes, rales or ronchi; respirations unlabored Chest Wall: No tenderness or deformity Heart: Regular rate and rhythm, S1 and S2 normal, no murmur, rub or gallop Breast Exam: No chest wall tenderness, masses or gynecomastia Abdomen: Soft, non-tender, nondistended, normoactive bowel sounds, no palpable masses Genitalia: declines  Extremities: No clubbing, cyanosis or edema Pulses: 2+ and symmetric all extremities Skin: Skin color, texture, turgor normal, no rashes or lesions Lymph nodes: Cervical and supraclavicular nodes normal Neurologic: CNII-XII intact, normal strength, sensation and gait; reflexes 2+ and symmetric throughout   Psych: Normal mood, affect, hygiene and grooming  ASSESSMENT/PLAN: Medicare annual wellness visit,  -Here today for his Medicare wellness visit.  Denies any issues with memory, ADLs, depression or falls Medications reviewed.  His medical team was updated.  Advanced directives reviewed and discussed.  MOST form filled out.  Once again, I gave him a packet of information regarding [facility] POA and living will and encouraged him to take time to fill these out.   Routine general medical examination at a health care facility - Plan: CBC with Differential/Platelet, Comprehensive metabolic panel, TSH, T4, free -Preventive health care reviewed.  Counseling on healthy  lifestyle including diet and exercise and cutting back on alcohol use.  Immunizations reviewed.  Recommend he get his Shingrix vaccine at his local pharmacy.  Discussed safety and health promotion.  Recommend regular dental cleanings  Morbid obesity (HCC) - Plan: TSH, T4, free -Discussed that he has gained weight since her last visit.  Recommend that he cut back on calories and carbohydrates and increase his physical activity.  OSA (obstructive sleep apnea) -Declines CPAP  Elevated LDL cholesterol level -Being followed by cardiology  Caregiver stress -Recommend counseling to help him deal with his stress.  He declines  Neck pain on right side -Discussed that he appears to have muscle tension and I recommend using heat or ice, topical analgesics over-the-counter and gentle neck stretches.  Also discussed getting a massage.  Recommend treating the underlying stress that is causing the issue.  He declines counseling  Screening for prostate cancer - Plan: PSA -Done per patient request    Discussed PSA screening (risks/benefits), recommended at least 30 minutes of aerobic activity at least 5 days/week; proper sunscreen use reviewed; healthy diet and alcohol recommendations (less than or equal to 2 drinks/day) reviewed; regular seatbelt use; changing batteries in smoke detectors. Immunization recommendations discussed.  Colonoscopy recommendations reviewed.   Medicare Attestation I have personally reviewed: The patient's medical and social history Their use of alcohol, tobacco or illicit drugs Their current medications and supplements The patient's functional ability including ADLs,fall risks, home safety risks, cognitive, and hearing and visual impairment Diet and physical activities Evidence for depression or mood disorders  The patient's weight, height, and BMI have been recorded in the chart.  I have made  referrals, counseling, and provided education to the patient based on review of  the above and I have provided the patient with a written personalized care plan for preventive services.     Hetty Blend, NP-C   06/28/2020

## 2020-06-28 ENCOUNTER — Ambulatory Visit (INDEPENDENT_AMBULATORY_CARE_PROVIDER_SITE_OTHER): Payer: Medicare HMO | Admitting: Family Medicine

## 2020-06-28 ENCOUNTER — Other Ambulatory Visit: Payer: Self-pay

## 2020-06-28 ENCOUNTER — Encounter: Payer: Self-pay | Admitting: Family Medicine

## 2020-06-28 VITALS — BP 130/80 | HR 63 | Ht 67.25 in | Wt 327.6 lb

## 2020-06-28 DIAGNOSIS — Z636 Dependent relative needing care at home: Secondary | ICD-10-CM | POA: Diagnosis not present

## 2020-06-28 DIAGNOSIS — E7800 Pure hypercholesterolemia, unspecified: Secondary | ICD-10-CM | POA: Diagnosis not present

## 2020-06-28 DIAGNOSIS — M542 Cervicalgia: Secondary | ICD-10-CM | POA: Diagnosis not present

## 2020-06-28 DIAGNOSIS — Z125 Encounter for screening for malignant neoplasm of prostate: Secondary | ICD-10-CM

## 2020-06-28 DIAGNOSIS — Z0000 Encounter for general adult medical examination without abnormal findings: Secondary | ICD-10-CM

## 2020-06-28 DIAGNOSIS — G4733 Obstructive sleep apnea (adult) (pediatric): Secondary | ICD-10-CM | POA: Diagnosis not present

## 2020-06-28 NOTE — Patient Instructions (Addendum)
  [name] , Thank you for taking time to come for your Medicare Wellness Visit. I appreciate your ongoing commitment to your health goals. Please review the following plan we discussed and let me know if I can assist you in the future.   These are the goals we discussed:  Try to cut back on carbohydrates and calories to lose weight.  Keep an eye on your blood pressure at home.  If your readings are consistently higher than 130/80, let me or your cardiologist know.  I recommend counseling to help you find ways to better deal with your stress.  Use heat or cool compress around your neck as well as topical medication.  Try Tylenol for her headaches. Do gentle neck stretches and massage may help as well.  I recommend regular dental and eye exams  You can get your shingles vaccine at your local pharmacy    This is a list of the screening recommended for you and due dates:  Health Maintenance  Topic Date Due  . COVID-19 Vaccine (2 - Pfizer 2-dose series) 04/17/2020  . Colon Cancer Screening  04/23/2023  . Tetanus Vaccine  02/17/2024  .  Hepatitis C: One time screening is recommended by Center for Disease Control  (CDC) for  adults born from [birth year] through 1965.   Completed  . HIV Screening  Completed  . Flu Shot  Discontinued

## 2020-06-29 LAB — PSA: Prostate Specific Ag, Serum: 0.8 ng/mL (ref 0.0–4.0)

## 2020-06-29 LAB — CBC WITH DIFFERENTIAL/PLATELET
Basophils Absolute: 0.1 10*3/uL (ref 0.0–0.2)
Basos: 1 %
EOS (ABSOLUTE): 0.9 10*3/uL — ABNORMAL HIGH (ref 0.0–0.4)
Eos: 11 %
Hematocrit: 41.6 % (ref 37.5–51.0)
Hemoglobin: 13.8 g/dL (ref 13.0–17.7)
Immature Grans (Abs): 0 10*3/uL (ref 0.0–0.1)
Immature Granulocytes: 0 %
Lymphocytes Absolute: 1.4 10*3/uL (ref 0.7–3.1)
Lymphs: 17 %
MCH: 30.7 pg (ref 26.6–33.0)
MCHC: 33.2 g/dL (ref 31.5–35.7)
MCV: 93 fL (ref 79–97)
Monocytes Absolute: 0.4 10*3/uL (ref 0.1–0.9)
Monocytes: 5 %
Neutrophils Absolute: 5.3 10*3/uL (ref 1.4–7.0)
Neutrophils: 66 %
Platelets: 260 10*3/uL (ref 150–450)
RBC: 4.49 x10E6/uL (ref 4.14–5.80)
RDW: 13.7 % (ref 11.6–15.4)
WBC: 8.1 10*3/uL (ref 3.4–10.8)

## 2020-06-29 LAB — COMPREHENSIVE METABOLIC PANEL
ALT: 16 IU/L (ref 0–44)
AST: 20 IU/L (ref 0–40)
Albumin/Globulin Ratio: 1.5 (ref 1.2–2.2)
Albumin: 3.8 g/dL (ref 3.8–4.9)
Alkaline Phosphatase: 57 IU/L (ref 44–121)
BUN/Creatinine Ratio: 17 (ref 9–20)
BUN: 19 mg/dL (ref 6–24)
Bilirubin Total: 0.3 mg/dL (ref 0.0–1.2)
CO2: 23 mmol/L (ref 20–29)
Calcium: 9.1 mg/dL (ref 8.7–10.2)
Chloride: 101 mmol/L (ref 96–106)
Creatinine, Ser: 1.12 mg/dL (ref 0.76–1.27)
GFR calc Af Amer: 83 mL/min/{1.73_m2} (ref >59)
GFR calc non Af Amer: 72 mL/min/{1.73_m2} (ref >59)
Globulin, Total: 2.5 g/dL (ref 1.5–4.5)
Glucose: 75 mg/dL (ref 65–99)
Potassium: 4.3 mmol/L (ref 3.5–5.2)
Sodium: 137 mmol/L (ref 134–144)
Total Protein: 6.3 g/dL (ref 6.0–8.5)

## 2020-06-29 LAB — TSH: TSH: 1.03 u[IU]/mL (ref 0.450–4.500)

## 2020-06-29 LAB — T4, FREE: Free T4: 0.96 ng/dL (ref 0.82–1.77)

## 2020-06-29 NOTE — Progress Notes (Signed)
His labs are fine.

## 2020-07-25 ENCOUNTER — Other Ambulatory Visit: Payer: Self-pay | Admitting: Family Medicine

## 2020-07-25 DIAGNOSIS — I10 Essential (primary) hypertension: Secondary | ICD-10-CM

## 2020-07-26 ENCOUNTER — Other Ambulatory Visit: Payer: Self-pay | Admitting: Family Medicine

## 2020-07-26 NOTE — Telephone Encounter (Signed)
Cardiology increased this

## 2020-08-08 ENCOUNTER — Other Ambulatory Visit: Payer: Self-pay | Admitting: Medical

## 2020-08-08 DIAGNOSIS — K219 Gastro-esophageal reflux disease without esophagitis: Secondary | ICD-10-CM

## 2020-08-23 ENCOUNTER — Encounter: Payer: Self-pay | Admitting: Medical

## 2020-08-23 ENCOUNTER — Ambulatory Visit (INDEPENDENT_AMBULATORY_CARE_PROVIDER_SITE_OTHER): Payer: Medicare HMO | Admitting: Medical

## 2020-08-23 ENCOUNTER — Other Ambulatory Visit: Payer: Self-pay

## 2020-08-23 VITALS — BP 150/84 | HR 81 | Ht 68.0 in | Wt 331.2 lb

## 2020-08-23 DIAGNOSIS — T25222A Burn of second degree of left foot, initial encounter: Secondary | ICD-10-CM

## 2020-08-23 MED ORDER — SULFAMETHOXAZOLE-TRIMETHOPRIM 800-160 MG PO TABS: 1.0000 | ORAL_TABLET | Freq: Two times a day (BID) | ORAL | 0 refills | Status: DC

## 2020-08-23 MED ORDER — SILVER SULFADIAZINE 1 % EX CREA: 1.0000 "application " | TOPICAL_CREAM | Freq: Every day | CUTANEOUS | 1 refills | Status: DC

## 2020-08-23 MED ORDER — HYDROCODONE-ACETAMINOPHEN 5-325 MG PO TABS: 1.0000 | ORAL_TABLET | Freq: Four times a day (QID) | ORAL | 0 refills | Status: DC | PRN

## 2020-08-23 NOTE — Patient Instructions (Signed)
Recommendations:  Keep the foot clean with mild soap and water gently with a wash rag and rinse gently  Use the Silvadene cream generously over the burn injury  Cover with nonstick bandage and either gauze or Coban type dressing  When possible elevate the foot and use cool therapy cool pack 20 minutes on 20 minutes off  You can use either over-the-counter pain medicine or you can use the pain medicine I prescribed today.  This can make you sleepy or constipated.  So be safe and consider stool softener for constipation  If you happen to see any changes over the weekend suggestive of infection such as fever, body aches or chills, nausea, reddening coloration around the burn wounds, worse swelling or pain then get checked out right away  Otherwise plan on rechecking here on Tuesday or Wednesday   Second-Degree Burn, Adult A second-degree burn, also called a partial thickness wound, is a serious injury that affects the first two layers of skin. A second-degree burn may be minor or major, depending on the size of the burn and which parts of the skin are burned. What are the causes? This condition may be caused by:  Heat. Burns caused by heat happen when skin comes in contact with something very hot, such as a flame or hot liquid.  Radiation. Sources of radiation include sunlight, radiation used to heat food, and radiation treatments.  Electricity. Burns caused by electricity happen when electricity passes through the body from lightning, wiring, electrical outlets, appliances, or power lines.  Certain chemicals, such as acids that come in contact with the skin or eyes. Some chemicals can go through clothing. What increases the risk? This condition is more likely to occur in people who:  Are often exposed to high-risk environments, such as those with open flames, chemicals, or electricity.  Have cancer and are being treated with radiation. What are the signs or symptoms? Symptoms of  this condition include:  Severe pain.  Skin that is deep red, blistered, tender, and swollen.  Skin that has changed color.  Skin that looks blotchy, wet, or shiny. How is this diagnosed? This condition is usually diagnosed with an exam of the wounded area. To get a better look at the wound, your health care provider may remove any blistered skin. It may take several days to find out if you have a second-degree burn because the signs of this kind of burn can take time to develop. You may need to watch the wound for changes at home and visit a health care provider repeatedly to have your wound checked for changes. If the wound is large, you may need to stay in the hospital so a health care team can examine the wound for a few days. How is this treated? Treatment depends on the severity and cause of the burn. Healing may take several weeks. Some second-degree burns, including major burns, electrical burns, and chemical burns, may need to be treated in a hospital. Treatment may involve:  Cooling the burn with cool, germ-free (sterile) water.  Taking or applying medicines, such as: ? Medicines to relieve pain or itching. ? Ointments to treat or prevent infection. ? Antibiotic medicine to treat or prevent infection.  Getting a tetanus shot.  Covering the burn with a bandage (dressing). If your fingers or toes were burned, each of them may be bandaged separately.  Compression dressings to prevent scarring and help the burned body part stay moveable.  Removing dead skin. This is done by  a health care provider. Do not try to remove dead skin yourself. Deep burns can cause skin tissue to die, and a scab (eschar) may form where the skin used to be. If you have a deep burn and an eschar forms, you may need surgery to remove the eschar so the skin can heal properly. If your wound is deep and large (it covers more than 15% of your skin), treatment may also involve:  Receiving fluids and  nutrition.  Close monitoring of blood flow near the wound.  Oxygen given through a mask or a machine (ventilator). This may be needed if a burn causes fluid shifts in the body that make it hard to breathe. Follow these instructions at home: Wound care   Follow instructions from your health care provider about how to take care of your wound. Make sure you: ? Wash your hands with soap and water before you change your dressing. If soap and water are not available, use hand sanitizer. ? Change your dressing as directed. ? If you have a compression dressing, wear it as directed.  Clean your wound 2-3 times a day or as often as directed. ? Wash the wound with mild soap and water. ? Rinse the wound with water to remove all soap. ? Pat the wound dry with a clean towel. Do not rub it.  Check your wound every day for signs of infection. Check for: ? More redness, swelling, or pain. ? More fluid or blood. ? Warmth. ? Pus or a bad smell. ? Yellow or green fluid.  Do not scratch or pick at the wound  Do not break any blisters or peel any skin.  Avoid exposing your wound to the sun. Medicine  Take and apply over-the-counter and prescription medicines only as told by your health care provider.  Take or apply your antibiotic medicine as told by your health care provider. Do not stop using the antibiotic even if you start to feel better. Eating and drinking  Drink enough fluid to keep your urine clear or pale yellow.  Eat a nutritious diet that is high in protein. This will help your wound to heal. General instructions  Raise (elevate) the injured area above the level of your heart while sitting or lying down.  Rest as directed. Do not exercise until your health care provider approves.  Do not take baths, swim, or use a hot tub until your health care provider approves.  Do not put ice on your burn. This can cause more damage. Try cooling the burn with: ? Cool water. ? A cool, wet,  clean cloth (cool compress).  Keep all follow-up visits as directed. This is important. Contact a health care provider if:  Your symptoms do not improve with treatment.  Your pain is not relieved with medicine.  You have more redness, swelling, or pain around your wound.  You have more fluid or blood coming from your wound.  You have yellow or green fluid, pus, or a bad smell coming from your wound.  Your wound feels warm to the touch.  You have a fever. Get help right away if:  You develop red streaks near the wound.  You develop severe pain. Summary  A second-degree burn is a serious injury that affects the first two layers of skin.  Clean your wound 2-3 times a day or as often as directed. Check your wound every day for signs of infection.  Do not scratch or pick at your wound, break blisters,  peel skin, or put ice on your burn. This information is not intended to replace advice given to you by your health care provider. Make sure you discuss any questions you have with your health care provider. Document Revised: 08/07/2017 Document Reviewed: 08/05/2016 Elsevier Patient Education  2020 [employer].
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
## 2020-08-29 ENCOUNTER — Ambulatory Visit (INDEPENDENT_AMBULATORY_CARE_PROVIDER_SITE_OTHER): Payer: Medicare HMO | Admitting: Medical

## 2020-08-29 ENCOUNTER — Other Ambulatory Visit: Payer: Self-pay

## 2020-08-29 ENCOUNTER — Encounter: Payer: Self-pay | Admitting: Medical

## 2020-08-29 VITALS — BP 150/88 | HR 75 | Ht 68.0 in | Wt 327.2 lb

## 2020-08-29 DIAGNOSIS — I10 Essential (primary) hypertension: Secondary | ICD-10-CM

## 2020-08-29 DIAGNOSIS — G4733 Obstructive sleep apnea (adult) (pediatric): Secondary | ICD-10-CM | POA: Diagnosis not present

## 2020-08-29 DIAGNOSIS — T25222A Burn of second degree of left foot, initial encounter: Secondary | ICD-10-CM

## 2020-08-29 MED ORDER — OXYCODONE-ACETAMINOPHEN 5-325 MG PO TABS: 1.0000 | ORAL_TABLET | Freq: Three times a day (TID) | ORAL | 0 refills | Status: DC | PRN

## 2020-08-29 MED ORDER — GABAPENTIN 100 MG PO CAPS: 100.0000 mg | ORAL_CAPSULE | Freq: Two times a day (BID) | ORAL | 1 refills | Status: DC
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
## 2020-08-29 NOTE — Patient Instructions (Addendum)
I made you an appointment with [facility] wound clinic for 09/05/2020 at 3:15pm.  Show up 15-20 minutes early for registration  Address: [address], [place], [place] [address] Phone: [phone]   Recommendations: Continue daily dressing changes with silvadene cream as you have been doing during the day.   Elevate the leg uncovered so the foot can air out 4-5 hours in the evenings.   Cover with dressing for bed though with or without silvadene.   Finish oral antibiotic  If new symptoms, worse pain, redness, swelling or other, call back right away.   I am adding Gabapentin twice daily for pain.  I changed the hydrocodone to Oxycodone, a stronger pain medication for as needed use for worse pain up to twice daily.   Caution as the pain medications can make you sleepy

## 2020-09-05 ENCOUNTER — Other Ambulatory Visit: Payer: Medicare HMO | Admitting: *Deleted

## 2020-09-05 ENCOUNTER — Other Ambulatory Visit: Payer: Self-pay

## 2020-09-05 ENCOUNTER — Encounter: Payer: Medicare HMO | Attending: Internal Medicine | Admitting: Internal Medicine

## 2020-09-05 DIAGNOSIS — T25222D Burn of second degree of left foot, subsequent encounter: Secondary | ICD-10-CM | POA: Diagnosis not present

## 2020-09-05 DIAGNOSIS — T25212A Burn of second degree of left ankle, initial encounter: Secondary | ICD-10-CM | POA: Diagnosis not present

## 2020-09-05 DIAGNOSIS — M1990 Unspecified osteoarthritis, unspecified site: Secondary | ICD-10-CM | POA: Insufficient documentation

## 2020-09-05 DIAGNOSIS — T25212D Burn of second degree of left ankle, subsequent encounter: Secondary | ICD-10-CM | POA: Insufficient documentation

## 2020-09-05 DIAGNOSIS — Z79899 Other long term (current) drug therapy: Secondary | ICD-10-CM | POA: Diagnosis not present

## 2020-09-05 DIAGNOSIS — I110 Hypertensive heart disease with heart failure: Secondary | ICD-10-CM | POA: Insufficient documentation

## 2020-09-05 DIAGNOSIS — I509 Heart failure, unspecified: Secondary | ICD-10-CM | POA: Diagnosis not present

## 2020-09-05 DIAGNOSIS — L98499 Non-pressure chronic ulcer of skin of other sites with unspecified severity: Secondary | ICD-10-CM | POA: Diagnosis not present

## 2020-09-05 DIAGNOSIS — G629 Polyneuropathy, unspecified: Secondary | ICD-10-CM | POA: Diagnosis not present

## 2020-09-05 DIAGNOSIS — E7800 Pure hypercholesterolemia, unspecified: Secondary | ICD-10-CM

## 2020-09-05 DIAGNOSIS — T25222A Burn of second degree of left foot, initial encounter: Secondary | ICD-10-CM | POA: Diagnosis not present

## 2020-09-05 LAB — LIPID PANEL
Chol/HDL Ratio: 2.6 ratio (ref 0.0–5.0)
Cholesterol, Total: 166 mg/dL (ref 100–199)
HDL: 65 mg/dL (ref >39)
LDL Chol Calc (NIH): 84 mg/dL (ref 0–99)
Triglycerides: 95 mg/dL (ref 0–149)
VLDL Cholesterol Cal: 17 mg/dL (ref 5–40)

## 2020-09-05 NOTE — Progress Notes (Signed)
[name], [name] ([id number]) Visit Report for 09/05/2020 Abuse/Suicide Risk Screen Details Patient Name: [name], [name]. Date of Service: 09/05/2020 3:15 PM Medical Record Number: [id number] Patient Account Number: [id number] Date of Birth/Sex: [birth year] (59 y.o. Male) Treating RN: Rogers Blocker Primary Care Kember Boch: Hetty Blend Other Clinician: Referring Jessicamarie Amiri: Hetty Blend Treating Ruslan Mccabe/Extender: Maxwell Caul Weeks in Treatment: 0 Abuse/Suicide Risk Screen Items Answer ABUSE RISK SCREEN: Has anyone close to you tried to hurt or harm you recentlyo No Do you feel uncomfortable with anyone in your familyo No Has anyone forced you do things that you didnot want to doo No Electronic Signature(s) Signed: 09/05/2020 4:45:04 PM By: Phillis Haggis, Dondra Prader RN Entered By: Phillis Haggis, Dondra Prader on 09/05/2020 15:26:37 [name] ([id number]) -------------------------------------------------------------------------------- Activities of Daily Living Details Patient Name: [name] Date of Service: 09/05/2020 3:15 PM Medical Record Number: [id number] Patient Account Number: [id number] Date of Birth/Sex: 02-20-1961 (59 y.o. Male) Treating RN: Rogers Blocker Primary Care Rigdon Macomber: Hetty Blend Other Clinician: Referring Edword Cu: Hetty Blend Treating Jordyn Doane/Extender: Altamese [place] in Treatment: 0 Activities of Daily Living Items Answer Activities of Daily Living (Please select one for each item) Drive Automobile Completely Able Take Medications Completely Able Use Telephone Completely Able Care for Appearance Completely Able Use Toilet Completely Able Bath / Shower Completely Able Dress Self Completely Able Feed Self Completely Able Walk Completely Able Get In / Out Bed Completely Able Housework Completely Able Prepare Meals Completely Able Handle Money Completely Able Shop for Self Completely Able Electronic Signature(s) Signed:  09/05/2020 4:45:04 PM By: Phillis Haggis, Dondra Prader RN Entered By: Phillis Haggis, Dondra Prader on 09/05/2020 15:26:52 [name] ([id number]) -------------------------------------------------------------------------------- Education Screening Details Patient Name: [name] Date of Service: 09/05/2020 3:15 PM Medical Record Number: [id number] Patient Account Number: [id number] Date of Birth/Sex: 12/13/1960 (59 y.o. Male) Treating RN: Rogers Blocker Primary Care Doris Gruhn: Hetty Blend Other Clinician: Referring Dicie Edelen: Hetty Blend Treating Taylee Gunnells/Extender: Altamese [place] in Treatment: 0 Primary Learner Assessed: Patient Learning Preferences/Education Level/Primary Language Learning Preference: Explanation, Demonstration Highest Education Level: High School Preferred Language: English Cognitive Barrier Language Barrier: No Translator Needed: No Memory Deficit: No Emotional Barrier: No Cultural/Religious Beliefs Affecting Medical Care: No Physical Barrier Impaired Vision: No Impaired Hearing: No Decreased Hand dexterity: No Knowledge/Comprehension Knowledge Level: High Comprehension Level: High Ability to understand written instructions: High Ability to understand verbal instructions: High Motivation Anxiety Level: Calm Cooperation: Cooperative Education Importance: Acknowledges Need Interest in Health Problems: Asks Questions Perception: Coherent Willingness to Engage in Self-Management High Activities: Readiness to Engage in Self-Management High Activities: Electronic Signature(s) Signed: 09/05/2020 4:45:04 PM By: Phillis Haggis, Dondra Prader RN Entered By: Phillis Haggis, Dondra Prader on 09/05/2020 15:27:21 [name] ([id number]) -------------------------------------------------------------------------------- Fall Risk Assessment Details Patient Name: [name] Date of Service: 09/05/2020 3:15 PM Medical Record Number: [id number] Patient  Account Number: [id number] Date of Birth/Sex: November 16, 1960 (59 y.o. Male) Treating RN: Rogers Blocker Primary Care Ernesto Lashway: Hetty Blend Other Clinician: Referring Andreah Goheen: Hetty Blend Treating Medina Degraffenreid/Extender: Altamese [place] in Treatment: 0 Fall Risk Assessment Items Have you had 2 or more falls in the last 12 monthso 0 No Have you had any fall that resulted in injury in the last 12 monthso 0 No FALLS RISK SCREEN History of falling - immediate or within 3 months 0 No Secondary diagnosis (Do you have 2 or more medical diagnoseso) 0 No Ambulatory aid None/bed rest/wheelchair/nurse 0 Yes Crutches/cane/walker 0 No Furniture 0 No Intravenous therapy Access/Saline/Heparin Lock 0 No Gait/Transferring  Normal/ bed rest/ wheelchair 0 Yes Weak (short steps with or without shuffle, stooped but able to lift head while walking, may 0 No seek support from furniture) Impaired (short steps with shuffle, may have difficulty arising from chair, head down, impaired 0 No balance) Mental Status Oriented to own ability 0 Yes Electronic Signature(s) Signed: 09/05/2020 4:45:04 PM By: Phillis Haggis, Dondra Prader RN Entered By: Phillis Haggis, Kenia on 09/05/2020 15:27:33 [name] ([id number]) -------------------------------------------------------------------------------- Foot Assessment Details Patient Name: [name] Date of Service: 09/05/2020 3:15 PM Medical Record Number: [id number] Patient Account Number: [id number] Date of Birth/Sex: [birth year] (59 y.o. Male) Treating RN: Rogers Blocker Primary Care Surena Welge: Hetty Blend Other Clinician: Referring Nichalos Brenton: Hetty Blend Treating Aster Eckrich/Extender: Altamese [place] in Treatment: 0 Foot Assessment Items Site Locations + = Sensation present, - = Sensation absent, C = Callus, U = Ulcer R = Redness, W = Warmth, M = Maceration, PU = Pre-ulcerative lesion F = Fissure, S = Swelling, D =  Dryness Assessment Right: Left: Other Deformity: No No Prior Foot Ulcer: No No Prior Amputation: No No Charcot Joint: No No Ambulatory Status: Ambulatory Without Help Gait: Steady Electronic Signature(s) Signed: 09/05/2020 4:45:04 PM By: Phillis Haggis, Dondra Prader RN Entered By: Phillis Haggis, Dondra Prader on 09/05/2020 15:44:08 [name] ([id number]) -------------------------------------------------------------------------------- Nutrition Risk Screening Details Patient Name: [name] Date of Service: 09/05/2020 3:15 PM Medical Record Number: [id number] Patient Account Number: [id number] Date of Birth/Sex: 02-25-1961 (59 y.o. Male) Treating RN: Rogers Blocker Primary Care Lillianah Swartzentruber: Hetty Blend Other Clinician: Referring Evony Rezek: Hetty Blend Treating Imelda Dandridge/Extender: Maxwell Caul Weeks in Treatment: 0 Height (in): Weight (lbs): Body Mass Index (BMI): Nutrition Risk Screening Items Score Screening NUTRITION RISK SCREEN: I have an illness or condition that made me change the kind and/or amount of food I eat 0 No I eat fewer than two meals per day 0 No I eat few fruits and vegetables, or milk products 0 No I have three or more drinks of beer, liquor or wine almost every day 0 No I have tooth or mouth problems that make it hard for me to eat 0 No I don't always have enough money to buy the food I need 0 No I eat alone most of the time 0 No I take three or more different prescribed or over-the-counter drugs a day 0 No Without wanting to, I have lost or gained 10 pounds in the last six months 0 No I am not always physically able to shop, cook and/or feed myself 0 No Nutrition Protocols Good Risk Protocol 0 No interventions needed Moderate Risk Protocol High Risk Proctocol Risk Level: Good Risk Score: 0 Electronic Signature(s) Signed: 09/05/2020 4:45:04 PM By: Phillis Haggis, Dondra Prader RN Entered By: Phillis Haggis, Dondra Prader on 09/05/2020 15:27:39

## 2020-09-06 NOTE — Progress Notes (Signed)
Justin, Fitzpatrick ([id number]) Visit Report for 09/05/2020 Allergy List Details Patient Name: [name], [name]. Date of Service: 09/05/2020 3:15 PM Medical Record Number: [id number] Patient Account Number: [id number] Date of Birth/Sex: 07-21-1961 (59 y.o. Male) Treating RN: Rogers Blocker Primary Care Ezzie Senat: Hetty Blend Other Clinician: Referring Willadene Mounsey: Hetty Blend Treating Hillel Card/Extender: Maxwell Caul Weeks in Treatment: 0 Allergies Active Allergies No Known Drug Allergies Type: Allergen Allergy Notes Electronic Signature(s) Signed: 09/05/2020 4:45:04 PM By: Phillis Haggis, Dondra Prader RN Entered By: Phillis Haggis, Dondra Prader on 09/05/2020 15:21:37 [name] ([id number]) -------------------------------------------------------------------------------- Arrival Information Details Patient Name: [name] Date of Service: 09/05/2020 3:15 PM Medical Record Number: [id number] Patient Account Number: [id number] Date of Birth/Sex: Mar 16, 1961 (59 y.o. Male) Treating RN: Rogers Blocker Primary Care Nefertari Rebman: Hetty Blend Other Clinician: Referring Ariya Bohannon: Hetty Blend Treating Edie Vallandingham/Extender: Altamese [place] in Treatment: 0 Visit Information Patient Arrived: Ambulatory Arrival Time: 15:21 Accompanied By: self Transfer Assistance: None Patient Identification Verified: Yes Secondary Verification Process Completed: Yes Electronic Signature(s) Signed: 09/05/2020 4:45:04 PM By: Phillis Haggis, Dondra Prader RN Entered By: Phillis Haggis, Dondra Prader on 09/05/2020 15:44:53 [name] ([id number]) -------------------------------------------------------------------------------- Clinic Level of Care Assessment Details Patient Name: [name] Date of Service: 09/05/2020 3:15 PM Medical Record Number: [id number] Patient Account Number: [id number] Date of Birth/Sex: Aug 27, 1961 (59 y.o. Male) Treating RN: Rogers Blocker Primary Care Donnavan Covault: Hetty Blend Other Clinician: Referring Farris Blash: Hetty Blend Treating Houa Ackert/Extender: Altamese  in Treatment: 0 Clinic Level of Care Assessment Items TOOL 1 Quantity Score X - Use when EandM and Procedure is performed on INITIAL visit 1 0 ASSESSMENTS - Nursing Assessment / Reassessment X - General Physical Exam (combine w/ comprehensive assessment (listed just below) when performed on new 1 20 pt. evals) X- 1 25 Comprehensive Assessment (HX, ROS, Risk Assessments, Wounds Hx, etc.) ASSESSMENTS - Wound and Skin Assessment / Reassessment []  - Dermatologic / Skin Assessment (not related to wound area) 0 ASSESSMENTS - Ostomy and/or Continence Assessment and Care []  - Incontinence Assessment and Management 0 []  - 0 Ostomy Care Assessment and Management (repouching, etc.) PROCESS - Coordination of Care X - Simple Patient / Family Education for ongoing care 1 15 []  - 0 Complex (extensive) Patient / Family Education for ongoing care X- 1 10 Staff obtains , Records, Test Results / Process Orders []  - 0 Staff telephones HHA, Nursing Homes / Clarify orders / etc []  - 0 Routine Transfer to another Facility (non-emergent condition) []  - 0 Routine Hospital Admission (non-emergent condition) X- 1 15 New Admissions / / Ordering NPWT, Apligraf, etc. []  - 0 Emergency Hospital Admission (emergent condition) PROCESS - Special Needs []  - Pediatric / Minor Patient Management 0 []  - 0 Isolation Patient Management []  - 0 Hearing / Language / Visual special needs []  - 0 Assessment of Community assistance (transportation, D/C planning, etc.) []  - 0 Additional assistance / Altered mentation []  - 0 Support Surface(s) Assessment (bed, cushion, seat, etc.) INTERVENTIONS - Miscellaneous []  - External ear exam 0 []  - 0 Patient Transfer (multiple staff / / Similar devices) []  - 0 Simple Staple / Suture removal (25 or less) []  - 0 Complex  Staple / Suture removal (26 or more) []  - 0 Hypo/Hyperglycemic Management (do not check if billed separately) X- 1 15 Ankle / Brachial Index (ABI) - do not check if billed separately Has the patient been seen at the hospital within the last three years: Yes Total Score: 100 Level Of  Care: New/Established - Level 3 DINARI, STGERMAINE ([id number]) Electronic Signature(s) Signed: 09/05/2020 4:45:04 PM By: Phillis Haggis, Dondra Prader RN Entered By: Phillis Haggis, Dondra Prader on 09/05/2020 16:07:44 [name] ([id number]) -------------------------------------------------------------------------------- Encounter Discharge Information Details Patient Name: [name] Date of Service: 09/05/2020 3:15 PM Medical Record Number: [id number] Patient Account Number: [id number] Date of Birth/Sex: 1961/03/15 (59 y.o. Male) Treating RN: Rogers Blocker Primary Care Susette Seminara: Hetty Blend Other Clinician: Referring Baya Lentz: Hetty Blend Treating Caleen Taaffe/Extender: Altamese [place] in Treatment: 0 Encounter Discharge Information Items Discharge Condition: Stable Ambulatory Status: Ambulatory Discharge Destination: Home Transportation: Private Auto Accompanied By: self Schedule Follow-up Appointment: Yes Clinical Summary of Care: Patient Declined Electronic Signature(s) Signed: 09/05/2020 4:45:04 PM By: Phillis Haggis, Dondra Prader RN Entered By: Phillis Haggis, Dondra Prader on 09/05/2020 16:13:51 [name] ([id number]) -------------------------------------------------------------------------------- Lower Extremity Assessment Details Patient Name: [name] Date of Service: 09/05/2020 3:15 PM Medical Record Number: [id number] Patient Account Number: [id number] Date of Birth/Sex: [birth year] (59 y.o. Male) Treating RN: Rogers Blocker Primary Care Mellony Danziger: Hetty Blend Other Clinician: Referring Johnathon Olden: Hetty Blend Treating Myan Locatelli/Extender: Maxwell Caul Weeks in  Treatment: 0 Edema Assessment Assessed: [Left: Yes] [Right: No] Edema: [Left: Ye] [Right: s] Calf Left: Right: Point of Measurement: 39 cm From Medial Instep 48 cm Ankle Left: Right: Point of Measurement: 11 cm From Medial Instep 25 cm Vascular Assessment Pulses: Dorsalis Pedis Palpable: [Left:No] Blood Pressure: Brachial: [Left:160] Ankle: [Left:Dorsalis Pedis: 160 1.00] Electronic Signature(s) Signed: 09/05/2020 4:45:04 PM By: Phillis Haggis, Dondra Prader RN Entered By: Phillis Haggis, Dondra Prader on 09/05/2020 15:41:15 [name] ([id number]) -------------------------------------------------------------------------------- Multi Wound Chart Details Patient Name: [name] Date of Service: 09/05/2020 3:15 PM Medical Record Number: [id number] Patient Account Number: [id number] Date of Birth/Sex: 1961/01/22 (59 y.o. Male) Treating RN: Rogers Blocker Primary Care Antavius Sperbeck: Hetty Blend Other Clinician: Referring Christepher Melchior: Hetty Blend Treating Macallan Ord/Extender: Altamese [place] in Treatment: 0 Vital Signs Height(in): Pulse(bpm): 70 Weight(lbs): Blood Pressure(mmHg): 160/98 Body Mass Index(BMI): Temperature(F): 98.5 Respiratory Rate(breaths/min): 20 Photos: [N/A:N/A] Wound Location: Left Ankle Dorsal Foot N/A Wounding Event: Thermal Burn Thermal Burn N/A Primary Etiology: 2nd degree Burn 2nd degree Burn N/A Comorbid History: Asthma, Chronic Obstructive Asthma, Chronic Obstructive N/A Pulmonary Disease (COPD), Sleep Pulmonary Disease (COPD), Sleep Apnea, Hypertension, History of Apnea, Hypertension, History of Burn, Osteoarthritis, Neuropathy Burn, Osteoarthritis, Neuropathy Date Acquired: 08/15/2020 08/15/2020 N/A Weeks of Treatment: 0 0 N/A Wound Status: Open Open N/A Measurements L x W x D (cm) 3x3.5x0.2 11.5x2.1x0.2 N/A Area (cm) : 8.247 18.967 N/A Volume (cm) : 1.649 3.793 N/A % Reduction in Area: 0.00% 0.00% N/A % Reduction in Volume: 0.00% 0.00%  N/A Classification: Full Thickness Without Exposed Full Thickness Without Exposed N/A Support Structures Support Structures Exudate Amount: Medium Medium N/A Exudate Type: Serosanguineous Serosanguineous N/A Exudate Color: red, brown red, brown N/A Granulation Amount: Medium (34-66%) Small (1-33%) N/A Granulation Quality: Red, Pink Red, Pink N/A Necrotic Amount: Medium (34-66%) Large (67-100%) N/A Necrotic Tissue: Adherent Slough Eschar, Adherent Slough N/A Exposed Structures: Fat Layer (Subcutaneous Tissue): Fat Layer (Subcutaneous Tissue): N/A Yes Yes Fascia: No Fascia: No Tendon: No Tendon: No Muscle: No Muscle: No Joint: No Joint: No Bone: No Bone: No Epithelialization: N/A Small (1-33%) N/A Procedures Performed: Burn Debridement: Small N/A N/A Treatment Notes Electronic Signature(s) Signed: 09/05/2020 6:56:58 PM By: Baltazar Najjar MD Entered By: Baltazar Najjar on 09/05/2020 15:57:55 [name] ([id number]) [name] ([id number]) -------------------------------------------------------------------------------- Multi-Disciplinary Care Plan Details Patient Name: [name] Date of Service: 09/05/2020 3:15  PM Medical Record Number: [id number] Patient Account Number: [id number] Date of Birth/Sex: 09-May-1961 (59 y.o. Male) Treating RN: Rogers Blocker Primary Care Arlin Savona: Hetty Blend Other Clinician: Referring Laura Radilla: Hetty Blend Treating Avarie Tavano/Extender: Altamese [place] in Treatment: 0 Active Inactive Wound/Skin Impairment Nursing Diagnoses: Knowledge deficit related to ulceration/compromised skin integrity Goals: Patient/caregiver will verbalize understanding of skin care regimen Date Initiated: 09/05/2020 Target Resolution Date: 10/05/2020 Goal Status: Active Ulcer/skin breakdown will have a volume reduction of 30% by week 4 Date Initiated: 09/05/2020 Target Resolution Date: 10/05/2020 Goal Status:  Active Interventions: Assess patient/caregiver ability to obtain necessary supplies Assess patient/caregiver ability to perform ulcer/skin care regimen upon admission and as needed Assess ulceration(s) every visit Notes: Electronic Signature(s) Signed: 09/05/2020 4:45:04 PM By: Phillis Haggis, Dondra Prader RN Entered By: Phillis Haggis, Dondra Prader on 09/05/2020 15:52:06 [name] ([id number]) -------------------------------------------------------------------------------- Pain Assessment Details Patient Name: [name] Date of Service: 09/05/2020 3:15 PM Medical Record Number: [id number] Patient Account Number: [id number] Date of Birth/Sex: 1961/06/23 (59 y.o. Male) Treating RN: Rogers Blocker Primary Care Elmer Boutelle: Hetty Blend Other Clinician: Referring Valta Dillon: Hetty Blend Treating Ray Gervasi/Extender: Altamese [place] in Treatment: 0 Active Problems Location of Pain Severity and Description of Pain Patient Has Paino Yes Site Locations Rate the pain. Current Pain Level: 6 Pain Management and Medication Current Pain Management: Electronic Signature(s) Signed: 09/05/2020 4:45:04 PM By: Phillis Haggis, Dondra Prader RN Entered By: Phillis Haggis, Dondra Prader on 09/05/2020 15:28:04 [name] ([id number]) -------------------------------------------------------------------------------- Patient/Caregiver Education Details Patient Name: [name] Date of Service: 09/05/2020 3:15 PM Medical Record Number: [id number] Patient Account Number: [id number] Date of Birth/Gender: June 21, 1961 (59 y.o. Male) Treating RN: Rogers Blocker Primary Care Physician: Hetty Blend Other Clinician: Referring Physician: Hetty Blend Treating Physician/Extender: Altamese [place] in Treatment: 0 Education Assessment Education Provided To: Patient Education Topics Provided Wound/Skin Impairment: Methods: Explain/Verbal Responses: State content correctly Electronic  Signature(s) Signed: 09/05/2020 4:45:04 PM By: Phillis Haggis, Dondra Prader RN Entered By: Phillis Haggis, Dondra Prader on 09/05/2020 16:11:25 [name] ([id number]) -------------------------------------------------------------------------------- Wound Assessment Details Patient Name: [name] Date of Service: 09/05/2020 3:15 PM Medical Record Number: [id number] Patient Account Number: [id number] Date of Birth/Sex: [birth year] (59 y.o. Male) Treating RN: Rogers Blocker Primary Care Icie Kuznicki: Hetty Blend Other Clinician: Referring Aasiyah Auerbach: Hetty Blend Treating Oniyah Rohe/Extender: Maxwell Caul Weeks in Treatment: 0 Wound Status Wound Number: 1 Primary 2nd degree Burn Etiology: Wound Location: Left Ankle Wound Open Wounding Event: Thermal Burn Status: Date Acquired: 08/15/2020 Comorbid Asthma, Chronic Obstructive Pulmonary Disease (COPD), Weeks Of Treatment: 0 History: Sleep Apnea, Hypertension, History of Burn, Clustered Wound: No Osteoarthritis, Neuropathy Photos Wound Measurements Length: (cm) 3 Width: (cm) 3.5 Depth: (cm) 0.2 Area: (cm) 8.247 Volume: (cm) 1.649 % Reduction in Area: 0% % Reduction in Volume: 0% Tunneling: No Undermining: No Wound Description Classification: Full Thickness Without Exposed Support Structu Exudate Amount: Medium Exudate Type: Serosanguineous Exudate Color: red, brown res Foul Odor After Cleansing: No Slough/Fibrino Yes Wound Bed Granulation Amount: Medium (34-66%) Exposed Structure Granulation Quality: Red, Pink Fascia Exposed: No Necrotic Amount: Medium (34-66%) Fat Layer (Subcutaneous Tissue) Exposed: Yes Necrotic Quality: Adherent Slough Tendon Exposed: No Muscle Exposed: No Joint Exposed: No Bone Exposed: No Treatment Notes Wound #1 (Left Ankle) 1. Cleansed with: May Shower, gently pat wound dry prior to applying new dressing. 4. Dressing Applied: Iodoflex 5. Secondary Dressing Applied BADEN, LEAL  ([id number]) Dry Gauze Kerlix/Conform Electronic Signature(s) Signed: 09/05/2020 4:45:04 PM By: Phillis Haggis, Dondra Prader RN Entered By: Phillis Haggis, Dondra Prader on  09/05/2020 15:45:28 [name] ([id number]) -------------------------------------------------------------------------------- Wound Assessment Details Patient Name: LOREN, SAWAYA. Date of Service: 09/05/2020 3:15 PM Medical Record Number: [id number] Patient Account Number: [id number] Date of Birth/Sex: [birth year] (59 y.o. Male) Treating RN: Rogers Blocker Primary Care Timohty Renbarger: Hetty Blend Other Clinician: Referring Marita Burnsed: Hetty Blend Treating Johne Buckle/Extender: Maxwell Caul Weeks in Treatment: 0 Wound Status Wound Number: 2 Primary 2nd degree Burn Etiology: Wound Location: Dorsal Foot Wound Open Wounding Event: Thermal Burn Status: Date Acquired: 08/15/2020 Comorbid Asthma, Chronic Obstructive Pulmonary Disease (COPD), Weeks Of Treatment: 0 History: Sleep Apnea, Hypertension, History of Burn, Clustered Wound: No Osteoarthritis, Neuropathy Photos Wound Measurements Length: (cm) 11.5 Width: (cm) 2.1 Depth: (cm) 0.2 Area: (cm) 18.967 Volume: (cm) 3.793 % Reduction in Area: 0% % Reduction in Volume: 0% Epithelialization: Small (1-33%) Tunneling: No Undermining: No Wound Description Classification: Full Thickness Without Exposed Support Structu Exudate Amount: Medium Exudate Type: Serosanguineous Exudate Color: red, brown res Foul Odor After Cleansing: No Slough/Fibrino Yes Wound Bed Granulation Amount: Small (1-33%) Exposed Structure Granulation Quality: Red, Pink Fascia Exposed: No Necrotic Amount: Large (67-100%) Fat Layer (Subcutaneous Tissue) Exposed: Yes Necrotic Quality: Eschar, Adherent Slough Tendon Exposed: No Muscle Exposed: No Joint Exposed: No Bone Exposed: No Treatment Notes Wound #2 (Dorsal Foot) 1. Cleansed with: May Shower, gently pat wound dry prior to applying new  dressing. 4. Dressing Applied: Iodoflex 5. Secondary Dressing Applied ELMAN, DETTMAN ([id number]) Dry Gauze Kerlix/Conform Electronic Signature(s) Signed: 09/05/2020 4:45:04 PM By: Phillis Haggis, Dondra Prader RN Entered By: Phillis Haggis, Dondra Prader on 09/05/2020 15:45:54 [name] ([id number]) -------------------------------------------------------------------------------- Vitals Details Patient Name: [name] Date of Service: 09/05/2020 3:15 PM Medical Record Number: [id number] Patient Account Number: [id number] Date of Birth/Sex: 1961-04-15 (59 y.o. Male) Treating RN: Rogers Blocker Primary Care Rheda Kassab: Hetty Blend Other Clinician: Referring Deetra Booton: Hetty Blend Treating Larri Brewton/Extender: Altamese [place] in Treatment: 0 Vital Signs Time Taken: 15:28 Temperature (F): 98.5 Pulse (bpm): 70 Respiratory Rate (breaths/min): 20 Blood Pressure (mmHg): 160/98 Reference Range: 80 - 120 mg / dl Electronic Signature(s) Signed: 09/05/2020 4:45:04 PM By: Phillis Haggis, Dondra Prader RN Entered By: Phillis Haggis, Dondra Prader on 09/05/2020 15:31:41
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
## 2020-09-12 ENCOUNTER — Other Ambulatory Visit: Payer: Self-pay

## 2020-09-12 ENCOUNTER — Encounter: Payer: Medicare HMO | Attending: Internal Medicine | Admitting: Internal Medicine

## 2020-09-12 DIAGNOSIS — X102XXD Contact with fats and cooking oils, subsequent encounter: Secondary | ICD-10-CM | POA: Insufficient documentation

## 2020-09-12 DIAGNOSIS — T25212A Burn of second degree of left ankle, initial encounter: Secondary | ICD-10-CM | POA: Diagnosis not present

## 2020-09-12 DIAGNOSIS — T25222A Burn of second degree of left foot, initial encounter: Secondary | ICD-10-CM | POA: Diagnosis not present

## 2020-09-12 DIAGNOSIS — T25292D Burn of second degree of multiple sites of left ankle and foot, subsequent encounter: Secondary | ICD-10-CM | POA: Insufficient documentation

## 2020-09-13 DIAGNOSIS — L97222 Non-pressure chronic ulcer of left calf with fat layer exposed: Secondary | ICD-10-CM | POA: Diagnosis not present
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
## 2020-09-18 ENCOUNTER — Other Ambulatory Visit: Payer: Self-pay | Admitting: Medical

## 2020-09-18 ENCOUNTER — Telehealth: Payer: Self-pay | Admitting: Medical

## 2020-09-18 MED ORDER — HYDROCODONE-ACETAMINOPHEN 7.5-325 MG PO TABS: 1.0000 | ORAL_TABLET | Freq: Four times a day (QID) | ORAL | 0 refills | Status: AC | PRN

## 2020-09-18 NOTE — Telephone Encounter (Signed)
I sent a refill on hydrocodone, I stepped down from the oxycodone stronger medication.  We need to start dropping down the dose so he does not get addicted to these medications  Try to use this more sparingly
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
## 2020-09-19 ENCOUNTER — Ambulatory Visit: Payer: Medicare HMO | Admitting: Internal Medicine

## 2020-09-19 NOTE — Telephone Encounter (Signed)
Patient advised.

## 2020-09-26 ENCOUNTER — Other Ambulatory Visit: Payer: Self-pay

## 2020-09-26 ENCOUNTER — Encounter: Payer: Medicare HMO | Admitting: Internal Medicine

## 2020-09-26 ENCOUNTER — Other Ambulatory Visit: Payer: Self-pay | Admitting: Family Medicine

## 2020-09-26 DIAGNOSIS — T25212A Burn of second degree of left ankle, initial encounter: Secondary | ICD-10-CM | POA: Diagnosis not present

## 2020-09-26 DIAGNOSIS — T25292D Burn of second degree of multiple sites of left ankle and foot, subsequent encounter: Secondary | ICD-10-CM | POA: Diagnosis not present

## 2020-09-26 DIAGNOSIS — E876 Hypokalemia: Secondary | ICD-10-CM

## 2020-09-26 DIAGNOSIS — T25222A Burn of second degree of left foot, initial encounter: Secondary | ICD-10-CM | POA: Diagnosis not present
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
## 2020-09-28 NOTE — Progress Notes (Signed)
OGLE, HOEFFNER ([id number]) Visit Report for 09/26/2020 Arrival Information Details Patient Name: [name], [name]. Date of Service: 09/26/2020 2:30 PM Medical Record Number: [id number] Patient Account Number: [id number] Date of Birth/Sex: [birth year] (60 y.o. M) Treating RN: Yevonne Pax Primary Care Yanette Tripoli: Hetty Blend Other Clinician: Referring Zennie Ayars: Hetty Blend Treating Eniya Cannady/Extender: Altamese [place] in Treatment: 3 Visit Information History Since Last Visit All ordered tests and consults were completed: No Patient Arrived: Ambulatory Added or deleted any medications: No Arrival Time: 14:41 Any new allergies or adverse reactions: No Accompanied By: self Had a fall or experienced change in No Transfer Assistance: None activities of daily living that may affect Patient Identification Verified: Yes risk of falls: Secondary Verification Process Completed: Yes Signs or symptoms of abuse/neglect since last visito No Patient Requires Transmission-Based Precautions: No Hospitalized since last visit: No Patient Has Alerts: No Implantable device outside of the clinic excluding No cellular tissue based products placed in the center since last visit: Has Dressing in Place as Prescribed: Yes Pain Present Now: Yes Electronic Signature(s) Signed: 09/28/2020 10:25:07 AM By: Yevonne Pax RN Entered By: Yevonne Pax on 09/26/2020 14:46:22 [name] ([id number]) -------------------------------------------------------------------------------- Clinic Level of Care Assessment Details Patient Name: [name] Date of Service: 09/26/2020 2:30 PM Medical Record Number: [id number] Patient Account Number: [id number] Date of Birth/Sex: May 17, 1961 (60 y.o. M) Treating RN: Huel Coventry Primary Care Itzamara Casas: Hetty Blend Other Clinician: Referring Karter Haire: Hetty Blend Treating Coreena Rubalcava/Extender: Altamese [place] in Treatment: 3 Clinic Level of Care  Assessment Items TOOL 4 Quantity Score []  - Use when only an EandM is performed on FOLLOW-UP visit 0 ASSESSMENTS - Nursing Assessment / Reassessment X - Reassessment of Co-morbidities (includes updates in patient status) 1 10 X- 1 5 Reassessment of Adherence to Treatment Plan ASSESSMENTS - Wound and Skin Assessment / Reassessment []  - Simple Wound Assessment / Reassessment - one wound 0 X- 2 5 Complex Wound Assessment / Reassessment - multiple wounds []  - 0 Dermatologic / Skin Assessment (not related to wound area) ASSESSMENTS - Focused Assessment []  - Circumferential Edema Measurements - multi extremities 0 []  - 0 Nutritional Assessment / Counseling / Intervention []  - 0 Lower Extremity Assessment (monofilament, tuning fork, pulses) []  - 0 Peripheral Arterial Disease Assessment (using hand held doppler) ASSESSMENTS - Ostomy and/or Continence Assessment and Care []  - Incontinence Assessment and Management 0 []  - 0 Ostomy Care Assessment and Management (repouching, etc.) PROCESS - Coordination of Care X - Simple Patient / Family Education for ongoing care 1 15 []  - 0 Complex (extensive) Patient / Family Education for ongoing care []  - 0 Staff obtains , Records, Test Results / Process Orders []  - 0 Staff telephones HHA, Nursing Homes / Clarify orders / etc []  - 0 Routine Transfer to another Facility (non-emergent condition) []  - 0 Routine Hospital Admission (non-emergent condition) []  - 0 New Admissions / / Ordering NPWT, Apligraf, etc. []  - 0 Emergency Hospital Admission (emergent condition) X- 1 10 Simple Discharge Coordination []  - 0 Complex (extensive) Discharge Coordination PROCESS - Special Needs []  - Pediatric / Minor Patient Management 0 []  - 0 Isolation Patient Management []  - 0 Hearing / Language / Visual special needs []  - 0 Assessment of Community assistance (transportation, D/C planning, etc.) []  - 0 Additional  assistance / Altered mentation []  - 0 Support Surface(s) Assessment (bed, cushion, seat, etc.) INTERVENTIONS - Wound Cleansing / Measurement Rayon, Caylin E. ( ) []  - 0 Simple Wound Cleansing -  one wound X- 2 5 Complex Wound Cleansing - multiple wounds X- 1 5 Wound Imaging (photographs - any number of wounds)  - 0 Wound Tracing (instead of photographs)  - 0 Simple Wound Measurement - one wound X- 2 5 Complex Wound Measurement - multiple wounds INTERVENTIONS - Wound Dressings  - Small Wound Dressing one or multiple wounds 0 X- 1 15 Medium Wound Dressing one or multiple wounds  - 0 Large Wound Dressing one or multiple wounds  - 0 Application of Medications - topical  - 0 Application of Medications - injection INTERVENTIONS - Miscellaneous  - External ear exam 0  - 0 Specimen Collection (cultures, biopsies, blood, body fluids, etc.)  - 0 Specimen(s) / Culture(s) sent or taken to Lab for analysis  - 0 Patient Transfer (multiple staff / Nurse, adult / Similar devices)  - 0 Simple Staple / Suture removal (25 or less)  - 0 Complex Staple / Suture removal (26 or more)  - 0 Hypo / Hyperglycemic Management (close monitor of Blood Glucose)  - 0 Ankle / Brachial Index (ABI) - do not check if billed separately X- 1 5 Vital Signs Has the patient been seen at the hospital within the last three years: Yes Total Score: 95 Level Of Care: New/Established - Level 3 Electronic Signature(s) Signed: 09/27/2020 8:41:15 AM By: Elliot Gurney, BSN, RN, CWS, Kim RN, BSN Entered By: Elliot Gurney, BSN, RN, CWS, Kim on 09/26/2020 15:14:34 [name] ([id number]) -------------------------------------------------------------------------------- Encounter Discharge Information Details Patient Name: [name] Date of Service: 09/26/2020 2:30 PM Medical Record Number: [id number] Patient Account Number: [id number] Date of Birth/Sex: Apr 23, 1961 (60 y.o. M) Treating RN:  Rogers Blocker Primary Care Jenner Rosier: Hetty Blend Other Clinician: Referring Adisynn Suleiman: Hetty Blend Treating Catalina Salasar/Extender: Altamese [place] in Treatment: 3 Encounter Discharge Information Items Discharge Condition: Stable Ambulatory Status: Ambulatory Discharge Destination: Home Transportation: Private Auto Accompanied By: self Schedule Follow-up Appointment: Yes Clinical Summary of Care: Electronic Signature(s) Signed: 09/26/2020 5:40:48 PM By: Phillis Haggis, Dondra Prader RN Entered By: Phillis Haggis, Dondra Prader on 09/26/2020 15:21:21 [name] ([id number]) -------------------------------------------------------------------------------- Lower Extremity Assessment Details Patient Name: [name] Date of Service: 09/26/2020 2:30 PM Medical Record Number: [id number] Patient Account Number: [id number] Date of Birth/Sex: March 10, 1961 (60 y.o. M) Treating RN: Yevonne Pax Primary Care Kalynne Womac: Hetty Blend Other Clinician: Referring Emmily Pellegrin: Hetty Blend Treating Myleigh Amara/Extender: Maxwell Caul Weeks in Treatment: 3 Edema Assessment Assessed: [Left: No] [Right: No] [Left: Edema] [Right: :] Calf Left: Right: Point of Measurement: From Medial Instep 45 cm Ankle Left: Right: Point of Measurement: From Medial Instep 23 cm Electronic Signature(s) Signed: 09/28/2020 10:25:07 AM By: Yevonne Pax RN Entered By: Yevonne Pax on 09/26/2020 14:55:28 [name] ([id number]) -------------------------------------------------------------------------------- Multi Wound Chart Details Patient Name: [name] Date of Service: 09/26/2020 2:30 PM Medical Record Number: [id number] Patient Account Number: [id number] Date of Birth/Sex: April 28, 1961 (60 y.o. M) Treating RN: Huel Coventry Primary Care Doren Kaspar: Hetty Blend Other Clinician: Referring Kasy Iannacone: Hetty Blend Treating Rohnan Bartleson/Extender: Altamese [place] in Treatment: 3 Vital  Signs Height(in): Pulse(bpm): 73 Weight(lbs): Blood Pressure(mmHg): 154/97 Body Mass Index(BMI): Temperature(F): 98.3 Respiratory Rate(breaths/min): 18 Photos: [N/A:N/A] Wound Location: Left Ankle Dorsal Foot N/A Wounding Event: Thermal Burn Thermal Burn N/A Primary Etiology: 2nd degree Burn 2nd degree Burn N/A Comorbid History: Asthma, Chronic Obstructive Asthma, Chronic Obstructive N/A Pulmonary Disease (COPD), Sleep Pulmonary Disease (COPD), Sleep Apnea, Hypertension, History of Apnea, Hypertension, History of Burn, Osteoarthritis, Neuropathy Burn, Osteoarthritis, Neuropathy Date Acquired: 08/15/2020 08/15/2020 N/A Tania Ade  of Treatment: 3 3 N/A Wound Status: Open Open N/A Measurements L x W x D (cm) 1.2x0.8x0.1 3x0.4x0.1 N/A Area (cm) : 0.754 0.942 N/A Volume (cm) : 0.075 0.094 N/A % Reduction in Area: 90.90% 95.00% N/A % Reduction in Volume: 95.50% 97.50% N/A Classification: Full Thickness Without Exposed Full Thickness Without Exposed N/A Support Structures Support Structures Exudate Amount: Medium Medium N/A Exudate Type: Serosanguineous Serosanguineous N/A Exudate Color: red, brown red, brown N/A Granulation Amount: Medium (34-66%) Medium (34-66%) N/A Granulation Quality: Red, Pink Red, Pink N/A Necrotic Amount: Medium (34-66%) Medium (34-66%) N/A Necrotic Tissue: Adherent Slough Eschar, Adherent Slough N/A Exposed Structures: Fat Layer (Subcutaneous Tissue): Fat Layer (Subcutaneous Tissue): N/A Yes Yes Fascia: No Fascia: No Tendon: No Tendon: No Muscle: No Muscle: No Joint: No Joint: No Bone: No Bone: No Epithelialization: None Small (1-33%) N/A Treatment Notes Wound #1 (Ankle) Wound Laterality: Left Cleanser Peri-Wound Care Topical SAVIYON, BEEBE ([id number]) Primary Dressing Xeroform 5x9-HBD (in/in) Discharge Instruction: Apply Xeroform 5x9-HBD (in/in) as directed Secondary Dressing ABD Pad 5x9 (in/in) Discharge Instruction: Cover with ABD  pad Secured With 15M Medipore H Soft Cloth Surgical Tape, 2x2 (in/yd) Conforming Stretch Gauze Bandage 2x75 (in/in) Discharge Instruction: Apply as directed Compression Wrap Compression Stockings Add-Ons Wound #2 (Foot) Wound Laterality: Dorsal Cleanser Peri-Wound Care Topical Primary Dressing Xeroform 5x9-HBD (in/in) Discharge Instruction: Apply Xeroform 5x9-HBD (in/in) as directed Secondary Dressing ABD Pad 5x9 (in/in) Discharge Instruction: Cover with ABD pad Secured With 15M Medipore H Soft Cloth Surgical Tape, 2x2 (in/yd) Conforming Stretch Gauze Bandage 2x75 (in/in) Discharge Instruction: Apply as directed Compression Wrap Compression Stockings Add-Ons Electronic Signature(s) Signed: 09/27/2020 1:16:15 PM By: Baltazar Najjar MD Entered By: Baltazar Najjar on 09/26/2020 15:32:29 [name] ([id number]) -------------------------------------------------------------------------------- Multi-Disciplinary Care Plan Details Patient Name: [name] Date of Service: 09/26/2020 2:30 PM Medical Record Number: [id number] Patient Account Number: [id number] Date of Birth/Sex: Mar 09, 1961 (60 y.o. M) Treating RN: Huel Coventry Primary Care Marika Mahaffy: Hetty Blend Other Clinician: Referring Rihanna Marseille: Hetty Blend Treating Miranda Frese/Extender: Altamese [place] in Treatment: 3 Active Inactive Necrotic Tissue Nursing Diagnoses: Impaired tissue integrity related to necrotic/devitalized tissue Goals: Necrotic/devitalized tissue will be minimized in the wound bed Date Initiated: 09/12/2020 Target Resolution Date: 10/03/2020 Goal Status: Active Interventions: Assess patient pain level pre-, during and post procedure and prior to discharge Treatment Activities: Apply topical anesthetic as ordered : 09/12/2020 Notes: Wound/Skin Impairment Nursing Diagnoses: Knowledge deficit related to ulceration/compromised skin integrity Goals: Patient/caregiver will verbalize  understanding of skin care regimen Date Initiated: 09/05/2020 Target Resolution Date: 10/05/2020 Goal Status: Active Ulcer/skin breakdown will have a volume reduction of 30% by week 4 Date Initiated: 09/05/2020 Target Resolution Date: 10/05/2020 Goal Status: Active Interventions: Assess patient/caregiver ability to obtain necessary supplies Assess patient/caregiver ability to perform ulcer/skin care regimen upon admission and as needed Assess ulceration(s) every visit Notes: Electronic Signature(s) Signed: 09/27/2020 8:41:15 AM By: Elliot Gurney, BSN, RN, CWS, Kim RN, BSN Entered By: Elliot Gurney, BSN, RN, CWS, Kim on 09/26/2020 15:11:27 [name] ([id number]) -------------------------------------------------------------------------------- Pain Assessment Details Patient Name: [name] Date of Service: 09/26/2020 2:30 PM Medical Record Number: [id number] Patient Account Number: [id number] Date of Birth/Sex: 1960/12/14 (60 y.o. M) Treating RN: Yevonne Pax Primary Care Chrstopher Malenfant: Hetty Blend Other Clinician: Referring Greidys Deland: Hetty Blend Treating Dorothee Napierkowski/Extender: Altamese [place] in Treatment: 3 Active Problems Location of Pain Severity and Description of Pain Patient Has Paino Yes Site Locations With Dressing Change: Yes Duration of the Pain. Constant / Intermittento Constant Rate the pain.  Current Pain Level: 4 Worst Pain Level: 10 Least Pain Level: 0 Tolerable Pain Level: 5 Character of Pain Describe the Pain: Throbbing Pain Management and Medication Current Pain Management: Medication: Yes Cold Application: No Rest: Yes Massage: No Activity: No T.E.N.S.: No Heat Application: No Leg drop or elevation: No Is the Current Pain Management Adequate: Inadequate How does your wound impact your activities of daily livingo Sleep: Yes Bathing: No Appetite: No Relationship With Others: No Bladder Continence: No Emotions: No Bowel Continence: No Work:  No Toileting: No Drive: No Dressing: No Hobbies: No Electronic Signature(s) Signed: 09/28/2020 10:25:07 AM By: Yevonne Pax RN Entered By: Yevonne Pax on 09/26/2020 14:48:05 [name] ([id number]) -------------------------------------------------------------------------------- Patient/Caregiver Education Details Patient Name: [name] Date of Service: 09/26/2020 2:30 PM Medical Record Number: [id number] Patient Account Number: [id number] Date of Birth/Gender: [birth year] (60 y.o. M) Treating RN: Huel Coventry Primary Care Physician: Hetty Blend Other Clinician: Referring Physician: Hetty Blend Treating Physician/Extender: Altamese [place] in Treatment: 3 Education Assessment Education Provided To: Patient Education Topics Provided Wound/Skin Impairment: Handouts: Caring for Your Ulcer Methods: Demonstration, Explain/Verbal Responses: State content correctly Electronic Signature(s) Signed: 09/27/2020 8:41:15 AM By: Elliot Gurney, BSN, RN, CWS, Kim RN, BSN Entered By: Elliot Gurney, BSN, RN, CWS, Kim on 09/26/2020 15:15:55 [name] ([id number]) -------------------------------------------------------------------------------- Wound Assessment Details Patient Name: [name] Date of Service: 09/26/2020 2:30 PM Medical Record Number: [id number] Patient Account Number: [id number] Date of Birth/Sex: March 15, 1961 (60 y.o. M) Treating RN: Yevonne Pax Primary Care Jean Alejos: Hetty Blend Other Clinician: Referring Sheral Pfahler: Hetty Blend Treating Courtland Coppa/Extender: Altamese [place] in Treatment: 3 Wound Status Wound Number: 1 Primary 2nd degree Burn Etiology: Wound Location: Left Ankle Wound Open Wounding Event: Thermal Burn Status: Date Acquired: 08/15/2020 Comorbid Asthma, Chronic Obstructive Pulmonary Disease (COPD), Weeks Of Treatment: 3 History: Sleep Apnea, Hypertension, History of Burn, Clustered Wound: No Osteoarthritis,  Neuropathy Photos Wound Measurements Length: (cm) 1.2 Width: (cm) 0.8 Depth: (cm) 0.1 Area: (cm) 0.754 Volume: (cm) 0.075 % Reduction in Area: 90.9% % Reduction in Volume: 95.5% Epithelialization: None Tunneling: No Undermining: No Wound Description Classification: Full Thickness Without Exposed Support Structu Exudate Amount: Medium Exudate Type: Serosanguineous Exudate Color: red, brown res Foul Odor After Cleansing: No Slough/Fibrino Yes Wound Bed Granulation Amount: Medium (34-66%) Exposed Structure Granulation Quality: Red, Pink Fascia Exposed: No Necrotic Amount: Medium (34-66%) Fat Layer (Subcutaneous Tissue) Exposed: Yes Necrotic Quality: Adherent Slough Tendon Exposed: No Muscle Exposed: No Joint Exposed: No Bone Exposed: No Treatment Notes Wound #1 (Ankle) Wound Laterality: Left Cleanser Peri-Wound Care Topical Primary Dressing MIRL, HILLERY ([id number]) Xeroform 5x9-HBD (in/in) Discharge Instruction: Apply Xeroform 5x9-HBD (in/in) as directed Secondary Dressing ABD Pad 5x9 (in/in) Discharge Instruction: Cover with ABD pad Secured With 62M Medipore H Soft Cloth Surgical Tape, 2x2 (in/yd) Conforming Stretch Gauze Bandage 2x75 (in/in) Discharge Instruction: Apply as directed Compression Wrap Compression Stockings Add-Ons Electronic Signature(s) Signed: 09/28/2020 10:25:07 AM By: Yevonne Pax RN Entered By: Yevonne Pax on 09/26/2020 14:54:06 [name] ([id number]) -------------------------------------------------------------------------------- Wound Assessment Details Patient Name: [name] Date of Service: 09/26/2020 2:30 PM Medical Record Number: [id number] Patient Account Number: [id number] Date of Birth/Sex: 01-27-1961 (60 y.o. M) Treating RN: Yevonne Pax Primary Care Cecile Gillispie: Hetty Blend Other Clinician: Referring Kiauna Zywicki: Hetty Blend Treating Melroy Bougher/Extender: Altamese [place] in Treatment: 3 Wound  Status Wound Number: 2 Primary 2nd degree Burn Etiology: Wound Location: Dorsal Foot Wound Open Wounding Event: Thermal Burn Status: Date Acquired: 08/15/2020 Comorbid Asthma, Chronic Obstructive Pulmonary  Disease (COPD), Weeks Of Treatment: 3 History: Sleep Apnea, Hypertension, History of Burn, Clustered Wound: No Osteoarthritis, Neuropathy Photos Wound Measurements Length: (cm) 3 Width: (cm) 0.4 Depth: (cm) 0.1 Area: (cm) 0.942 Volume: (cm) 0.094 % Reduction in Area: 95% % Reduction in Volume: 97.5% Epithelialization: Small (1-33%) Tunneling: No Undermining: No Wound Description Classification: Full Thickness Without Exposed Support Structu Exudate Amount: Medium Exudate Type: Serosanguineous Exudate Color: red, brown res Foul Odor After Cleansing: No Slough/Fibrino Yes Wound Bed Granulation Amount: Medium (34-66%) Exposed Structure Granulation Quality: Red, Pink Fascia Exposed: No Necrotic Amount: Medium (34-66%) Fat Layer (Subcutaneous Tissue) Exposed: Yes Necrotic Quality: Eschar, Adherent Slough Tendon Exposed: No Muscle Exposed: No Joint Exposed: No Bone Exposed: No Treatment Notes Wound #2 (Foot) Wound Laterality: Dorsal Cleanser Peri-Wound Care Topical Primary Dressing LENZY, KERSCHNER ([id number]) Xeroform 5x9-HBD (in/in) Discharge Instruction: Apply Xeroform 5x9-HBD (in/in) as directed Secondary Dressing ABD Pad 5x9 (in/in) Discharge Instruction: Cover with ABD pad Secured With 44M Medipore H Soft Cloth Surgical Tape, 2x2 (in/yd) Conforming Stretch Gauze Bandage 2x75 (in/in) Discharge Instruction: Apply as directed Compression Wrap Compression Stockings Add-Ons Electronic Signature(s) Signed: 09/28/2020 10:25:07 AM By: Yevonne Pax RN Entered By: Yevonne Pax on 09/26/2020 14:54:59 [name] ([id number]) -------------------------------------------------------------------------------- Vitals Details Patient Name: [name] Date  of Service: 09/26/2020 2:30 PM Medical Record Number: [id number] Patient Account Number: [id number] Date of Birth/Sex: 03/14/1961 (60 y.o. M) Treating RN: Yevonne Pax Primary Care Loucille Takach: Hetty Blend Other Clinician: Referring Dwight Adamczak: Hetty Blend Treating Larra Crunkleton/Extender: Altamese [place] in Treatment: 3 Vital Signs Time Taken: 14:46 Temperature (F): 98.3 Pulse (bpm): 73 Respiratory Rate (breaths/min): 18 Blood Pressure (mmHg): 154/97 Reference Range: 80 - 120 mg / dl Electronic Signature(s) Signed: 09/28/2020 10:25:07 AM By: Yevonne Pax RN Entered By: Yevonne Pax on 09/26/2020 14:47:22

## 2020-10-10 ENCOUNTER — Encounter: Payer: Medicare HMO | Attending: Internal Medicine | Admitting: Internal Medicine

## 2020-10-10 ENCOUNTER — Other Ambulatory Visit: Payer: Self-pay

## 2020-10-10 DIAGNOSIS — Z87891 Personal history of nicotine dependence: Secondary | ICD-10-CM | POA: Insufficient documentation

## 2020-10-10 DIAGNOSIS — T25222D Burn of second degree of left foot, subsequent encounter: Secondary | ICD-10-CM | POA: Insufficient documentation

## 2020-10-10 DIAGNOSIS — Z833 Family history of diabetes mellitus: Secondary | ICD-10-CM | POA: Diagnosis not present

## 2020-10-10 DIAGNOSIS — M069 Rheumatoid arthritis, unspecified: Secondary | ICD-10-CM | POA: Diagnosis not present

## 2020-10-10 DIAGNOSIS — Z8249 Family history of ischemic heart disease and other diseases of the circulatory system: Secondary | ICD-10-CM | POA: Insufficient documentation

## 2020-10-10 DIAGNOSIS — X102XXD Contact with fats and cooking oils, subsequent encounter: Secondary | ICD-10-CM | POA: Insufficient documentation

## 2020-10-10 DIAGNOSIS — I110 Hypertensive heart disease with heart failure: Secondary | ICD-10-CM | POA: Diagnosis not present

## 2020-10-10 DIAGNOSIS — T25222A Burn of second degree of left foot, initial encounter: Secondary | ICD-10-CM | POA: Diagnosis not present

## 2020-10-10 DIAGNOSIS — I509 Heart failure, unspecified: Secondary | ICD-10-CM | POA: Insufficient documentation

## 2020-10-10 DIAGNOSIS — G629 Polyneuropathy, unspecified: Secondary | ICD-10-CM | POA: Diagnosis not present

## 2020-10-10 DIAGNOSIS — T25212A Burn of second degree of left ankle, initial encounter: Secondary | ICD-10-CM | POA: Diagnosis not present

## 2020-10-10 NOTE — Progress Notes (Addendum)
[name], [name] ([id number]) Visit Report for 10/10/2020 Arrival Information Details Patient Name: [name], [name]. Date of Service: 10/10/2020 1:45 PM Medical Record Number: [id number] Patient Account Number: [id number] Date of Birth/Sex: 06-13-1961 (60 y.o. M) Treating RN: Huel Coventry Primary Care Breton Berns: Hetty Blend Other Clinician: Referring Elynor Kallenberger: Hetty Blend Treating Hargis Vandyne/Extender: Altamese [place] in Treatment: 5 Visit Information History Since Last Visit Added or deleted any medications: No Patient Arrived: Ambulatory Any new allergies or adverse reactions: No Arrival Time: 13:50 Had a fall or experienced change in No Accompanied By: self activities of daily living that may affect Transfer Assistance: None risk of falls: Patient Requires Transmission-Based Precautions: No Signs or symptoms of abuse/neglect since last visito No Patient Has Alerts: No Hospitalized since last visit: No Implantable device outside of the clinic excluding No cellular tissue based products placed in the center since last visit: Has Dressing in Place as Prescribed: Yes Has Compression in Place as Prescribed: Yes Pain Present Now: No Electronic Signature(s) Signed: 10/10/2020 6:05:57 PM By: Elliot Gurney, BSN, RN, CWS, Kim RN, BSN Previous Signature: 10/10/2020 4:47:42 PM Version By: Dayton Martes RCP, RRT, CHT Entered By: Elliot Gurney, BSN, RN, CWS, Kim on 10/10/2020 18:05:56 [name] ([id number]) -------------------------------------------------------------------------------- Clinic Level of Care Assessment Details Patient Name: [name] Date of Service: 10/10/2020 1:45 PM Medical Record Number: [id number] Patient Account Number: [id number] Date of Birth/Sex: 08-Apr-1961 (60 y.o. M) Treating RN: Huel Coventry Primary Care Doyel Mulkern: Hetty Blend Other Clinician: Referring Ettel Albergo: Hetty Blend Treating Justice Milliron/Extender: Altamese [place] in Treatment:  5 Clinic Level of Care Assessment Items TOOL 4 Quantity Score []  - Use when only an EandM is performed on FOLLOW-UP visit 0 ASSESSMENTS - Nursing Assessment / Reassessment X - Reassessment of Co-morbidities (includes updates in patient status) 1 10 X- 1 5 Reassessment of Adherence to Treatment Plan ASSESSMENTS - Wound and Skin Assessment / Reassessment X - Simple Wound Assessment / Reassessment - one wound 1 5 []  - 0 Complex Wound Assessment / Reassessment - multiple wounds []  - 0 Dermatologic / Skin Assessment (not related to wound area) ASSESSMENTS - Focused Assessment []  - Circumferential Edema Measurements - multi extremities 0 []  - 0 Nutritional Assessment / Counseling / Intervention []  - 0 Lower Extremity Assessment (monofilament, tuning fork, pulses) []  - 0 Peripheral Arterial Disease Assessment (using hand held doppler) ASSESSMENTS - Ostomy and/or Continence Assessment and Care []  - Incontinence Assessment and Management 0 []  - 0 Ostomy Care Assessment and Management (repouching, etc.) PROCESS - Coordination of Care X - Simple Patient / Family Education for ongoing care 1 15 []  - 0 Complex (extensive) Patient / Family Education for ongoing care []  - 0 Staff obtains , Records, Test Results / Process Orders []  - 0 Staff telephones HHA, Nursing Homes / Clarify orders / etc []  - 0 Routine Transfer to another Facility (non-emergent condition) []  - 0 Routine Hospital Admission (non-emergent condition) []  - 0 New Admissions / / Ordering NPWT, Apligraf, etc. []  - 0 Emergency Hospital Admission (emergent condition) X- 1 10 Simple Discharge Coordination []  - 0 Complex (extensive) Discharge Coordination PROCESS - Special Needs []  - Pediatric / Minor Patient Management 0 []  - 0 Isolation Patient Management []  - 0 Hearing / Language / Visual special needs []  - 0 Assessment of Community assistance (transportation, D/C planning, etc.) []   - 0 Additional assistance / Altered mentation []  - 0 Support Surface(s) Assessment (bed, cushion, seat, etc.) INTERVENTIONS - Wound Cleansing / Measurement  [name], [name] ([id number])  - 0 Simple Wound Cleansing - one wound  - 0 Complex Wound Cleansing - multiple wounds  - 0 Wound Imaging (photographs - any number of wounds)  - 0 Wound Tracing (instead of photographs)  - 0 Simple Wound Measurement - one wound  - 0 Complex Wound Measurement - multiple wounds INTERVENTIONS - Wound Dressings  - Small Wound Dressing one or multiple wounds 0  - 0 Medium Wound Dressing one or multiple wounds  - 0 Large Wound Dressing one or multiple wounds  - 0 Application of Medications - topical  - 0 Application of Medications - injection INTERVENTIONS - Miscellaneous  - External ear exam 0  - 0 Specimen Collection (cultures, biopsies, blood, body fluids, etc.)  - 0 Specimen(s) / Culture(s) sent or taken to Lab for analysis  - 0 Patient Transfer (multiple staff / Nurse, adult / Similar devices)  - 0 Simple Staple / Suture removal (25 or less)  - 0 Complex Staple / Suture removal (26 or more)  - 0 Hypo / Hyperglycemic Management (close monitor of Blood Glucose)  - 0 Ankle / Brachial Index (ABI) - do not check if billed separately X- 1 5 Vital Signs Has the patient been seen at the hospital within the last three years: Yes Total Score: 50 Level Of Care: New/Established - Level 2 Electronic Signature(s) Signed: 10/10/2020 6:10:40 PM By: Elliot Gurney, BSN, RN, CWS, Kim RN, BSN Previous Signature: 10/10/2020 5:11:32 PM Version By: Elliot Gurney, BSN, RN, CWS, Kim RN, BSN Entered By: Elliot Gurney, BSN, RN, CWS, Kim on 10/10/2020 18:07:41 [name] ([id number]) -------------------------------------------------------------------------------- Encounter Discharge Information Details Patient Name: [name] Date of Service: 10/10/2020 1:45 PM Medical Record Number:  [id number] Patient Account Number: [id number] Date of Birth/Sex: 08/24/1961 (60 y.o. M) Treating RN: Yevonne Pax Primary Care Kerrie Latour: Hetty Blend Other Clinician: Referring Kaylon Hitz: Hetty Blend Treating Korynne Dols/Extender: Altamese [place] in Treatment: 5 Encounter Discharge Information Items Discharge Condition: Stable Ambulatory Status: Ambulatory Discharge Destination: Home Transportation: Private Auto Accompanied By: self Schedule Follow-up Appointment: Yes Clinical Summary of Care: Patient Declined Electronic Signature(s) Signed: 10/10/2020 4:56:52 PM By: Yevonne Pax RN Entered By: Yevonne Pax on 10/10/2020 16:50:08 [name] ([id number]) -------------------------------------------------------------------------------- Lower Extremity Assessment Details Patient Name: [name] Date of Service: 10/10/2020 1:45 PM Medical Record Number: [id number] Patient Account Number: [id number] Date of Birth/Sex: Jun 09, 1961 (60 y.o. M) Treating RN: Yevonne Pax Primary Care Njeri Vicente: Hetty Blend Other Clinician: Referring Lleyton Byers: Hetty Blend Treating Muhammadali Ries/Extender: Maxwell Caul [place] in Treatment: 5 Edema Assessment Assessed: [Left: No] [Right: No] [Left: Edema] [Right: :] Calf Left: Right: Point of Measurement: From Medial Instep 46 cm Ankle Left: Right: Point of Measurement: From Medial Instep 23 cm Electronic Signature(s) Signed: 10/10/2020 6:06:39 PM By: Elliot Gurney, BSN, RN, CWS, Kim RN, BSN Signed: 10/11/2020 4:28:47 PM By: Yevonne Pax RN Previous Signature: 10/10/2020 4:56:52 PM Version By: Yevonne Pax RN Entered By: Elliot Gurney, BSN, RN, CWS, Kim on 10/10/2020 18:06:39 [name] ([id number]) -------------------------------------------------------------------------------- Multi Wound Chart Details Patient Name: [name] Date of Service: 10/10/2020 1:45 PM Medical Record Number: [id number] Patient Account Number: [id number] Date of  Birth/Sex: 12-Jan-1961 (60 y.o. M) Treating RN: Huel Coventry Primary Care Jillyan Plitt: Hetty Blend Other Clinician: Referring Zaydee Aina: Hetty Blend Treating Quenisha Lovins/Extender: Altamese [place] in Treatment: 5 Vital Signs Height(in): 68 Pulse(bpm): 69 Weight(lbs): 300 Blood Pressure(mmHg): 152/92 Body Mass Index(BMI): 46 Temperature(F): 98.6 Respiratory Rate(breaths/min): 20 Photos: [N/A:N/A] Wound Location: Left Ankle Dorsal Foot N/A  Wounding Event: Thermal Burn Thermal Burn N/A Primary Etiology: 2nd degree Burn 2nd degree Burn N/A Comorbid History: Asthma, Chronic Obstructive Asthma, Chronic Obstructive N/A Pulmonary Disease (COPD), Sleep Pulmonary Disease (COPD), Sleep Apnea, Hypertension, History of Apnea, Hypertension, History of Burn, Osteoarthritis, Neuropathy Burn, Osteoarthritis, Neuropathy Date Acquired: 08/15/2020 08/15/2020 N/A Weeks of Treatment: 5 5 N/A Wound Status: Open Open N/A Measurements L x W x D (cm) 0x0x0 0x0x0 N/A Area (cm) : 0 0 N/A Volume (cm) : 0 0 N/A % Reduction in Area: 100.00% 100.00% N/A % Reduction in Volume: 100.00% 100.00% N/A Classification: Full Thickness Without Exposed Full Thickness Without Exposed N/A Support Structures Support Structures Exudate Amount: None Present None Present N/A Granulation Amount: None Present (0%) None Present (0%) N/A Necrotic Amount: None Present (0%) None Present (0%) N/A Exposed Structures: Fascia: No N/A N/A Fat Layer (Subcutaneous Tissue): No Tendon: No Muscle: No Joint: No Bone: No Epithelialization: Large (67-100%) Large (67-100%) N/A Treatment Notes Wound #1 (Ankle) Wound Laterality: Left Cleanser Peri-Wound Care Topical Primary Dressing Secondary Dressing JOHNROBERT, FOTI ([id number]) Secured With Compression Wrap Compression Stockings Add-Ons Wound #2 (Foot) Wound Laterality: Dorsal Cleanser Peri-Wound Care Topical Primary Dressing Secondary Dressing Secured  With Compression Wrap Compression Stockings Add-Ons Electronic Signature(s) Signed: 10/10/2020 6:08:32 PM By: Elliot Gurney, BSN, RN, CWS, Kim RN, BSN Previous Signature: 10/10/2020 6:07:12 PM Version By: Elliot Gurney, BSN, RN, CWS, Kim RN, BSN Previous Signature: 10/10/2020 2:23:22 PM Version By: Elliot Gurney, BSN, RN, CWS, Kim RN, BSN Entered By: Elliot Gurney, BSN, RN, CWS, Kim on 10/10/2020 18:08:32 [name] ([id number]) -------------------------------------------------------------------------------- Multi-Disciplinary Care Plan Details Patient Name: [name] Date of Service: 10/10/2020 1:45 PM Medical Record Number: [id number] Patient Account Number: [id number] Date of Birth/Sex: [birth year] (60 y.o. M) Treating RN: Huel Coventry Primary Care Bradford Cazier: Hetty Blend Other Clinician: Referring Deklyn Gibbon: Hetty Blend Treating Gerre Ranum/Extender: Altamese [place] in Treatment: 5 Active Inactive Electronic Signature(s) Signed: 10/10/2020 6:06:50 PM By: Elliot Gurney, BSN, RN, CWS, Kim RN, BSN Previous Signature: 10/10/2020 2:23:14 PM Version By: Elliot Gurney, BSN, RN, CWS, Kim RN, BSN Entered By: Elliot Gurney, BSN, RN, CWS, Kim on 10/10/2020 18:06:49 [name] ([id number]) -------------------------------------------------------------------------------- Pain Assessment Details Patient Name: [name] Date of Service: 10/10/2020 1:45 PM Medical Record Number: [id number] Patient Account Number: [id number] Date of Birth/Sex: 1961/01/17 (60 y.o. M) Treating RN: Yevonne Pax Primary Care Jesi Jurgens: Hetty Blend Other Clinician: Referring Yvon Mccord: Hetty Blend Treating Addylin Manke/Extender: Altamese [place] in Treatment: 5 Active Problems Location of Pain Severity and Description of Pain Patient Has Paino No Site Locations Pain Management and Medication Current Pain Management: Electronic Signature(s) Signed: 10/10/2020 6:06:16 PM By: Elliot Gurney, BSN, RN, CWS, Kim RN, BSN Signed: 10/11/2020 4:28:47 PM By: Yevonne Pax RN Previous Signature: 10/10/2020 4:56:52 PM Version By: Yevonne Pax RN Entered By: Elliot Gurney, BSN, RN, CWS, Kim on 10/10/2020 18:06:16 [name] ([id number]) -------------------------------------------------------------------------------- Wound Assessment Details Patient Name: [name] Date of Service: 10/10/2020 1:45 PM Medical Record Number: [id number] Patient Account Number: [id number] Date of Birth/Sex: [birth year] (60 y.o. M) Treating RN: Yevonne Pax Primary Care Kostas Marrow: Hetty Blend Other Clinician: Referring Ashonti Leandro: Hetty Blend Treating Dustyn Armbrister/Extender: Rowan Blase in Treatment: 5 Wound Status Wound Number: 1 Primary 2nd degree Burn Etiology: Wound Location: Left Ankle Wound Open Wounding Event: Thermal Burn Status: Date Acquired: 08/15/2020 Comorbid Asthma, Chronic Obstructive Pulmonary Disease (COPD), Weeks Of Treatment: 5 History: Sleep Apnea, Hypertension, History of Burn, Clustered Wound: No Osteoarthritis, Neuropathy Photos Wound Measurements Length: (cm) 0 Width: (cm) 0  Depth: (cm) 0 Area: (cm) 0 Volume: (cm) 0 % Reduction in Area: 100% % Reduction in Volume: 100% Epithelialization: Large (67-100%) Tunneling: No Undermining: No Wound Description Classification: Full Thickness Without Exposed Support Structure Exudate Amount: None Present s Foul Odor After Cleansing: No Slough/Fibrino No Wound Bed Granulation Amount: None Present (0%) Exposed Structure Necrotic Amount: None Present (0%) Fascia Exposed: No Fat Layer (Subcutaneous Tissue) Exposed: No Tendon Exposed: No Muscle Exposed: No Joint Exposed: No Bone Exposed: No Electronic Signature(s) Signed: 10/10/2020 4:56:52 PM By: Yevonne Pax RN Entered By: Yevonne Pax on 10/10/2020 14:07:03 [name] ([id number]) -------------------------------------------------------------------------------- Wound Assessment Details Patient Name: [name] Date of  Service: 10/10/2020 1:45 PM Medical Record Number: [id number] Patient Account Number: [id number] Date of Birth/Sex: 21-Apr-1961 (60 y.o. M) Treating RN: Yevonne Pax Primary Care Kishia Shackett: Hetty Blend Other Clinician: Referring Demarus Latterell: Hetty Blend Treating Juan Kissoon/Extender: Rowan Blase in Treatment: 5 Wound Status Wound Number: 2 Primary 2nd degree Burn Etiology: Wound Location: Dorsal Foot Wound Open Wounding Event: Thermal Burn Status: Date Acquired: 08/15/2020 Comorbid Asthma, Chronic Obstructive Pulmonary Disease (COPD), Weeks Of Treatment: 5 History: Sleep Apnea, Hypertension, History of Burn, Clustered Wound: No Osteoarthritis, Neuropathy Photos Wound Measurements Length: (cm) 0 Width: (cm) 0 Depth: (cm) 0 Area: (cm) 0 Volume: (cm) 0 % Reduction in Area: 100% % Reduction in Volume: 100% Epithelialization: Large (67-100%) Tunneling: No Undermining: No Wound Description Classification: Full Thickness Without Exposed Support Structure Exudate Amount: None Present s Foul Odor After Cleansing: No Slough/Fibrino No Wound Bed Granulation Amount: None Present (0%) Necrotic Amount: None Present (0%) Electronic Signature(s) Signed: 10/10/2020 4:56:52 PM By: Yevonne Pax RN Entered By: Yevonne Pax on 10/10/2020 14:07:36 [name] ([id number]) -------------------------------------------------------------------------------- Vitals Details Patient Name: [name] Date of Service: 10/10/2020 1:45 PM Medical Record Number: [id number] Patient Account Number: [id number] Date of Birth/Sex: 1961-08-13 (60 y.o. M) Treating RN: Huel Coventry Primary Care Oddis Westling: Hetty Blend Other Clinician: Referring Miaisabella Bacorn: Hetty Blend Treating Graclynn Vanantwerp/Extender: Altamese [place] in Treatment: 5 Vital Signs Time Taken: 13:50 Temperature (F): 98.6 Height (in): 68 Pulse (bpm): 69 Source: Stated Respiratory Rate (breaths/min): 20 Weight (lbs): 300 Blood  Pressure (mmHg): 152/92 Source: Stated Reference Range: 80 - 120 mg / dl Body Mass Index (BMI): 45.6 Electronic Signature(s) Signed: 10/10/2020 6:06:04 PM By: Elliot Gurney, BSN, RN, CWS, Kim RN, BSN Previous Signature: 10/10/2020 4:47:42 PM Version By: Dayton Martes RCP, RRT, CHT Entered By: Elliot Gurney, BSN, RN, CWS, Kim on 10/10/2020 18:06:04
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
## 2020-10-22 DIAGNOSIS — L97222 Non-pressure chronic ulcer of left calf with fat layer exposed: Secondary | ICD-10-CM | POA: Diagnosis not present

## 2020-10-25 ENCOUNTER — Other Ambulatory Visit: Payer: Self-pay | Admitting: Family Medicine

## 2020-10-25 DIAGNOSIS — I10 Essential (primary) hypertension: Secondary | ICD-10-CM

## 2020-11-21 ENCOUNTER — Other Ambulatory Visit: Payer: Self-pay

## 2020-11-21 ENCOUNTER — Ambulatory Visit: Payer: Medicare HMO | Attending: Critical Care Medicine

## 2020-11-21 DIAGNOSIS — Z23 Encounter for immunization: Secondary | ICD-10-CM

## 2020-11-21 NOTE — Progress Notes (Signed)
   Covid-19 Vaccination Clinic  Name:  [name]    MRN: [id number] DOB: June 30, 1961  11/21/2020  [name] was observed post Covid-19 immunization for 15 minutes without incident. He was provided with Vaccine Information Sheet and instruction to access the V-Safe system.   [name] was instructed to call 911 with any severe reactions post vaccine: Marland Kitchen Difficulty breathing  . Swelling of face and throat  . A fast heartbeat  . A bad rash all over body  . Dizziness and weakness   Immunizations Administered    Name Date Dose VIS Date Route   PFIZER Comrnaty(Gray TOP) Covid-19 Vaccine 11/21/2020  1:35 PM 0.3 mL 08/16/2020 Intramuscular   Manufacturer: [employer], [employer]   Lot: [id number]   NDC: [phone]

## 2020-11-26 ENCOUNTER — Other Ambulatory Visit: Payer: Self-pay | Admitting: Medical

## 2020-11-26 DIAGNOSIS — K219 Gastro-esophageal reflux disease without esophagitis: Secondary | ICD-10-CM

## 2020-12-07 IMAGING — CR LUMBAR SPINE - COMPLETE 4+ VIEW
6 series · 6 of 6 positions shown · non-contrast
Comparison: None.

CLINICAL DATA: Left buttock and leg pain likely sciatica

EXAM:
LUMBAR SPINE - COMPLETE 4+ VIEW

[t l-spine a.p.]
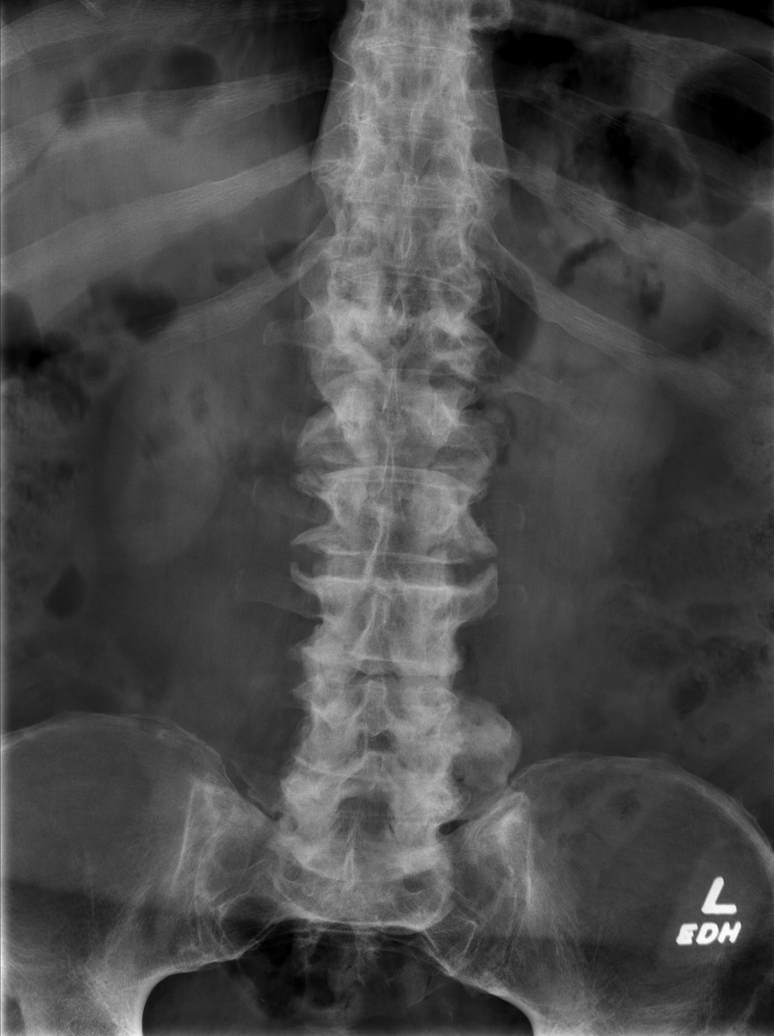

[t l-spine oblique exposure (1 of 3)]
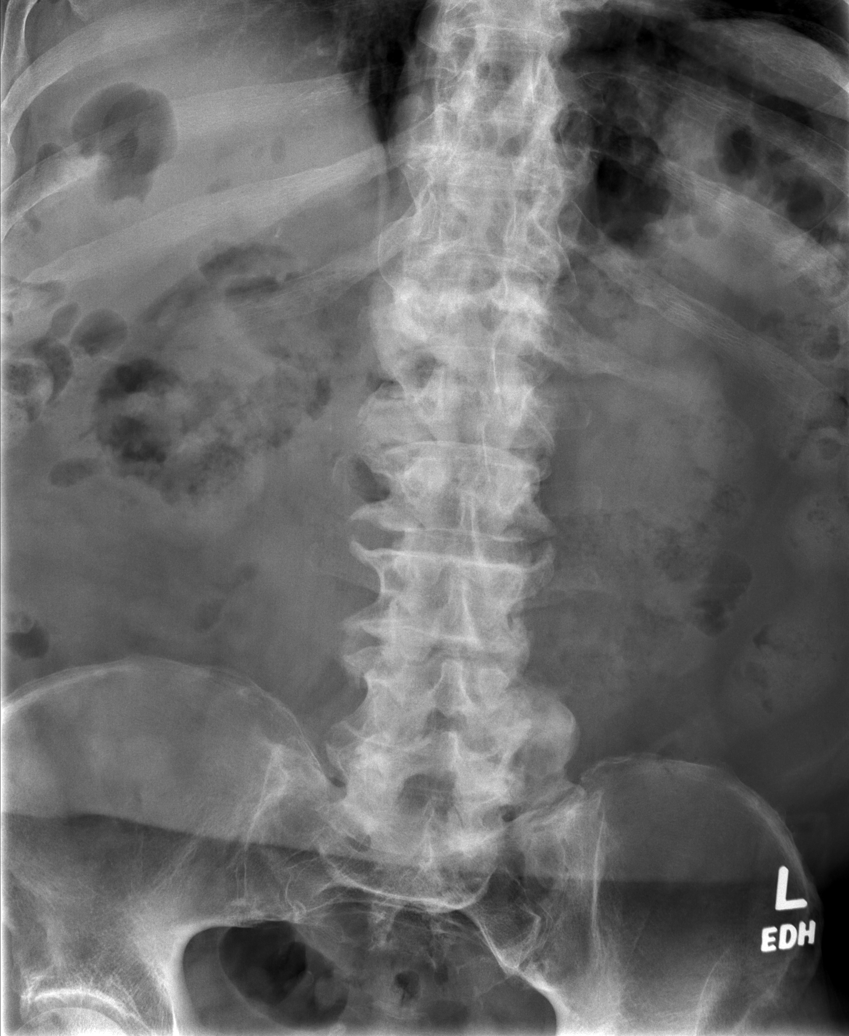

[t l-spine oblique exposure (2 of 3)]
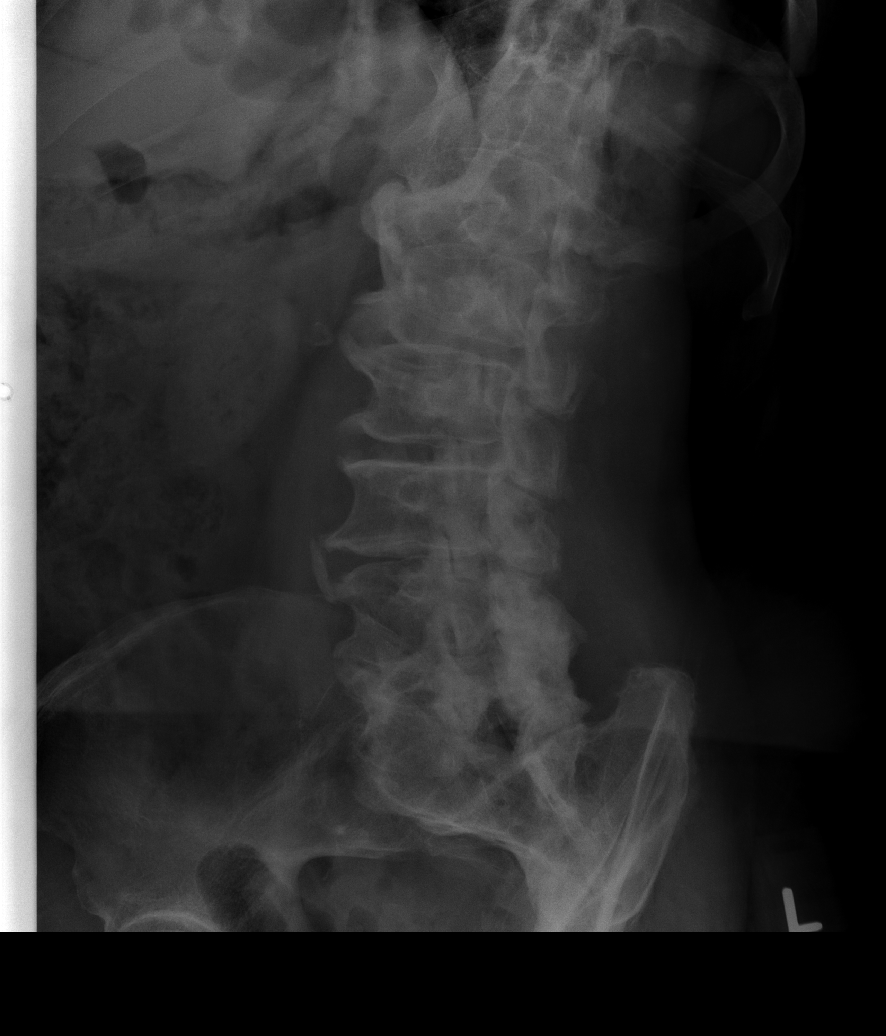

[t l-spine oblique exposure (3 of 3)]
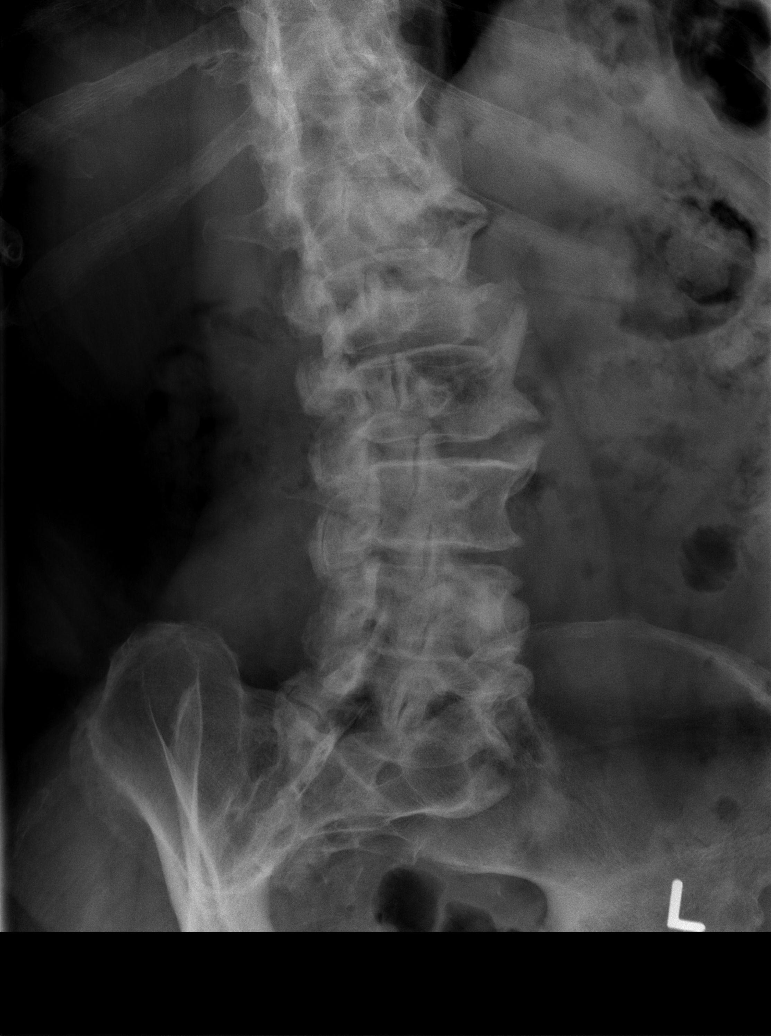

[t l-spine lat]
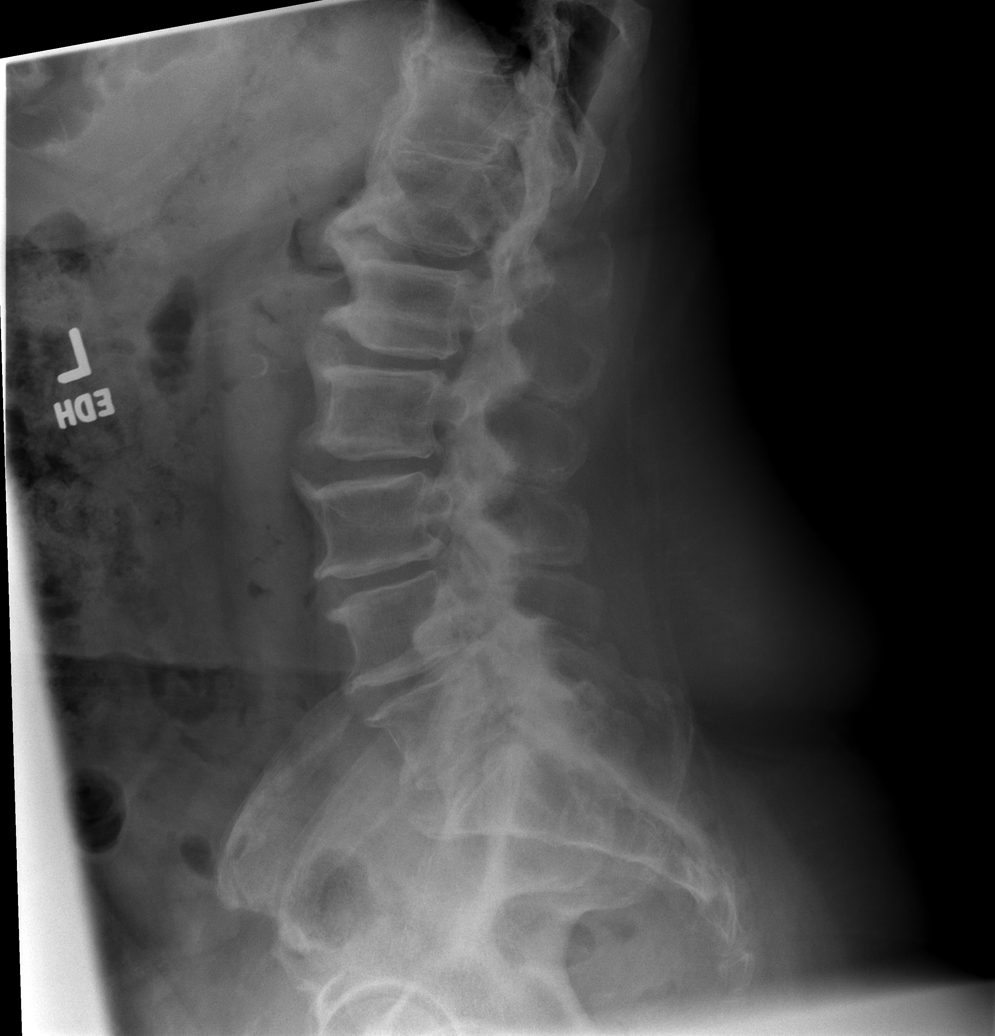

[t l-spine l5-s1 spot]
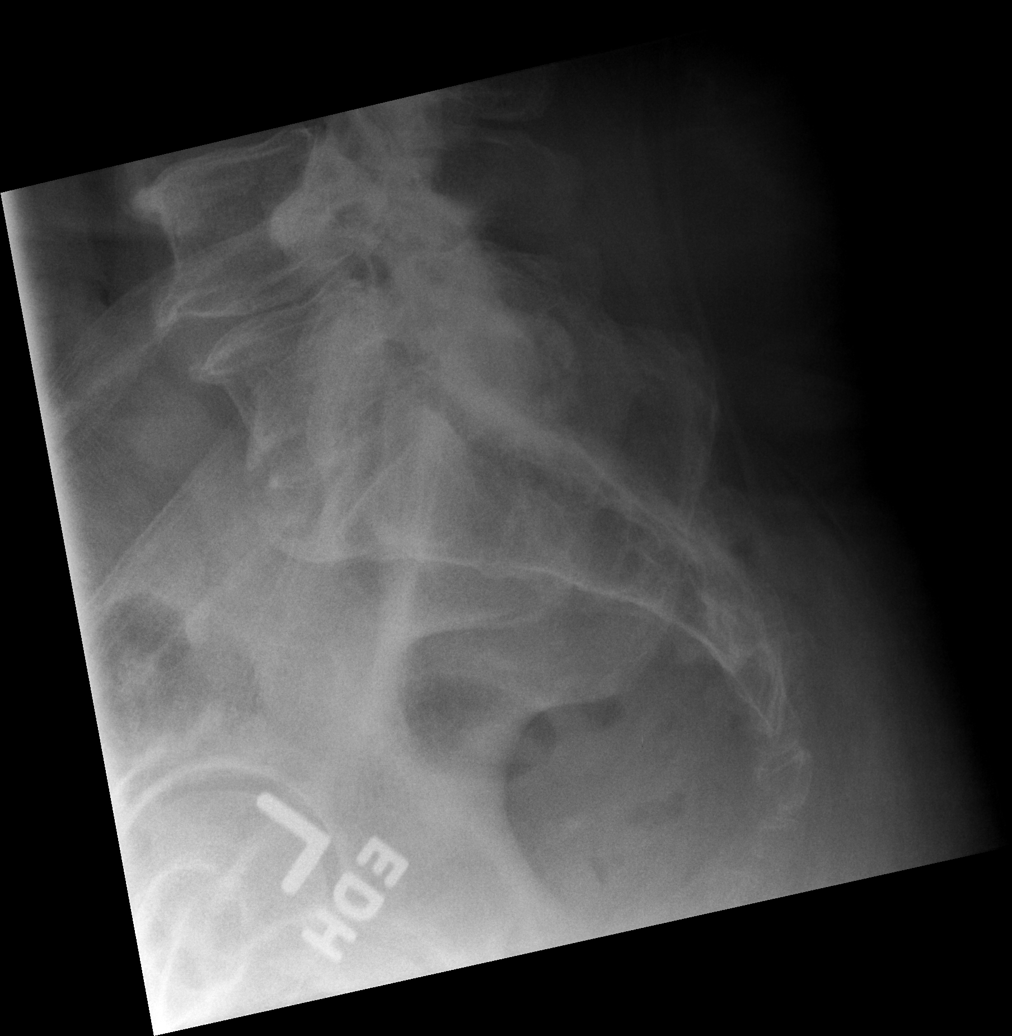

[6 of 6 positions shown; findings below may reference images not displayed]

FINDINGS: For the purposes of this dictation the last inter disc space is
labeled as L5-S1. Disc height loss and facet arthrosis are seen
throughout the lumbar spine is most notable at L4-L5 and L5-S1.
Large anterior osteophytes are seen throughout the lumbar spine.
There is a left-sided bridging syndesmophyte seen at L4-L5. No
fracture is identified. Sclerosis around the bilateral sacroiliac
joints.
IMPRESSION: Lumbar spine spondylosis most notable at L4-L5 and L5-S1.

## 2020-12-27 ENCOUNTER — Other Ambulatory Visit: Payer: Self-pay | Admitting: Family Medicine

## 2020-12-27 DIAGNOSIS — E876 Hypokalemia: Secondary | ICD-10-CM

## 2020-12-27 NOTE — Telephone Encounter (Signed)
Ok to refill but needs an office visit for med check and labs.

## 2020-12-27 NOTE — Telephone Encounter (Signed)
Ok to refill 

## 2020-12-28 NOTE — Telephone Encounter (Signed)
Pt has appt may 2nd

## 2021-01-07 ENCOUNTER — Ambulatory Visit (INDEPENDENT_AMBULATORY_CARE_PROVIDER_SITE_OTHER): Payer: Medicare HMO | Admitting: Family Medicine

## 2021-01-07 ENCOUNTER — Encounter: Payer: Self-pay | Admitting: Family Medicine

## 2021-01-07 VITALS — BP 130/80 | HR 72 | Wt 332.8 lb

## 2021-01-07 DIAGNOSIS — I10 Essential (primary) hypertension: Secondary | ICD-10-CM | POA: Diagnosis not present

## 2021-01-07 DIAGNOSIS — M545 Low back pain, unspecified: Secondary | ICD-10-CM

## 2021-01-07 DIAGNOSIS — R1084 Generalized abdominal pain: Secondary | ICD-10-CM

## 2021-01-07 NOTE — Progress Notes (Signed)
   Subjective:    Patient ID: [name], male    DOB: [birth year], 60 y.o.   MRN: [id number]  HPI Chief Complaint  Patient presents with  . med check    Med check, doing yard work over the weekend and having back spasms   He is here for a medication management visit.   HTN- on Bystolic and Lasix. Sees cardiology. No concerns with medication.   Right low back spasm after working in the yard 4 days ago. Pain improves with sitting. No pain at the moment.   States his stomach was bothering him but it was related to what her ate.   Takes omeprazole daily.   Denies fever, chills, dizziness, chest pain, palpitations, shortness of breath, N/V/D, urinary symptoms, LE edema.     Review of Systems Pertinent positives and negatives in the history of present illness.     Objective:   Physical Exam BP 130/80   Pulse 72   Wt (!) 332 lb 12.8 oz (151 kg)   BMI 50.60 kg/m   Alert and oriented and in no acute distress. Respirations unlabored. Normal speech, mood and thought process.       Assessment & Plan:  Primary hypertension - Plan: CBC with Differential/Platelet, Comprehensive metabolic panel  Generalized abdominal pain  Acute right-sided low back pain without sciatica  Continue medication.  He will try conservative management of his back pain including OTC medications and ice or heat.  Follow up if worsening.  Avoid food triggers for abdominal pain and continue Prilosec.  Continue to follow up with cardiologist.

## 2021-01-08 LAB — COMPREHENSIVE METABOLIC PANEL
ALT: 13 IU/L (ref 0–44)
AST: 19 IU/L (ref 0–40)
Albumin/Globulin Ratio: 1.6 (ref 1.2–2.2)
Albumin: 4.1 g/dL (ref 3.8–4.9)
Alkaline Phosphatase: 60 IU/L (ref 44–121)
BUN/Creatinine Ratio: 10 (ref 9–20)
BUN: 14 mg/dL (ref 6–24)
Bilirubin Total: 0.3 mg/dL (ref 0.0–1.2)
CO2: 24 mmol/L (ref 20–29)
Calcium: 9.1 mg/dL (ref 8.7–10.2)
Chloride: 101 mmol/L (ref 96–106)
Creatinine, Ser: 1.35 mg/dL — ABNORMAL HIGH (ref 0.76–1.27)
Globulin, Total: 2.6 g/dL (ref 1.5–4.5)
Glucose: 88 mg/dL (ref 65–99)
Potassium: 4.4 mmol/L (ref 3.5–5.2)
Sodium: 139 mmol/L (ref 134–144)
Total Protein: 6.7 g/dL (ref 6.0–8.5)
eGFR: 60 mL/min/{1.73_m2} (ref >59)

## 2021-01-08 LAB — CBC WITH DIFFERENTIAL/PLATELET
Basophils Absolute: 0.1 10*3/uL (ref 0.0–0.2)
Basos: 1 %
EOS (ABSOLUTE): 0.5 10*3/uL — ABNORMAL HIGH (ref 0.0–0.4)
Eos: 8 %
Hematocrit: 42.5 % (ref 37.5–51.0)
Hemoglobin: 14 g/dL (ref 13.0–17.7)
Immature Grans (Abs): 0 10*3/uL (ref 0.0–0.1)
Immature Granulocytes: 0 %
Lymphocytes Absolute: 1.4 10*3/uL (ref 0.7–3.1)
Lymphs: 20 %
MCH: 30 pg (ref 26.6–33.0)
MCHC: 32.9 g/dL (ref 31.5–35.7)
MCV: 91 fL (ref 79–97)
Monocytes Absolute: 0.5 10*3/uL (ref 0.1–0.9)
Monocytes: 7 %
Neutrophils Absolute: 4.2 10*3/uL (ref 1.4–7.0)
Neutrophils: 64 %
Platelets: 265 10*3/uL (ref 150–450)
RBC: 4.66 x10E6/uL (ref 4.14–5.80)
RDW: 13.6 % (ref 11.6–15.4)
WBC: 6.7 10*3/uL (ref 3.4–10.8)

## 2021-01-08 NOTE — Progress Notes (Signed)
His kidney function increased slightly and this is most likely due to not being well hydrated. I recommend increasing water intake slightly.

## 2021-01-19 ENCOUNTER — Other Ambulatory Visit: Payer: Self-pay | Admitting: Family Medicine

## 2021-01-19 DIAGNOSIS — E876 Hypokalemia: Secondary | ICD-10-CM

## 2021-01-21 NOTE — Telephone Encounter (Signed)
Potassium was normal last time

## 2021-01-26 ENCOUNTER — Other Ambulatory Visit: Payer: Self-pay | Admitting: Family Medicine

## 2021-01-26 DIAGNOSIS — I10 Essential (primary) hypertension: Secondary | ICD-10-CM

## 2021-02-02 IMAGING — MR MR ANKLE*L* W/O CM
4 series · 40 of 40 positions shown · non-contrast
Comparison: None.

CLINICAL DATA: Midfoot pain with numbness around the ankle for 2
months.

EXAM:
MRI OF THE LEFT ANKLE WITHOUT CONTRAST
TECHNIQUE: Multiplanar, multisequence MR imaging of the ankle was performed. No
intravenous contrast was administered.

[Series 3: T2 fat-sat · axial · 3.0mm · 0.66mm/px · z∈[-113,+25]mm · 11 of 40 slices shown]
[im 1/40]
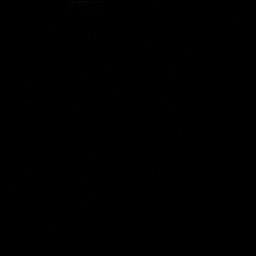
[im 4/40]
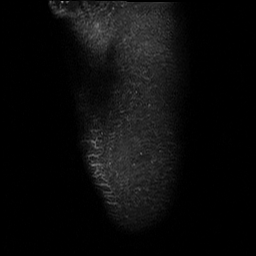
[im 8/40]
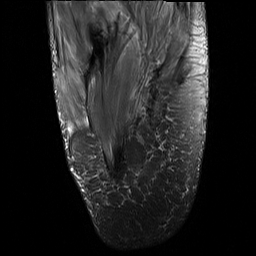
[im 12/40]
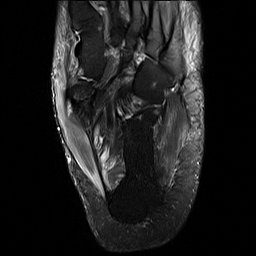
[im 16/40]
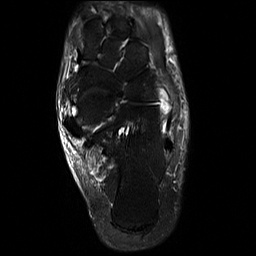
[im 20/40]
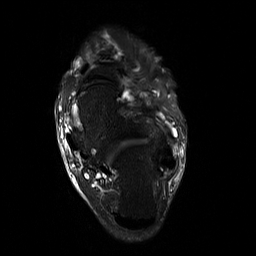
[im 24/40]
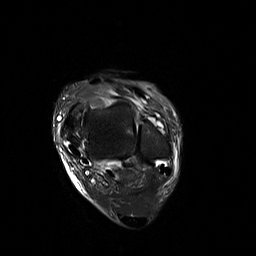
[im 28/40]
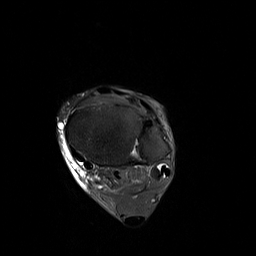
[im 32/40]
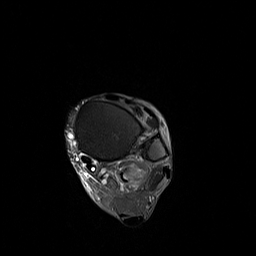
[im 36/40]
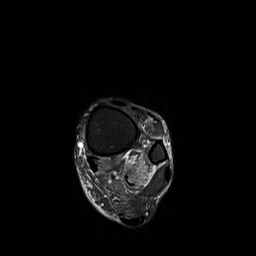
[im 40/40]
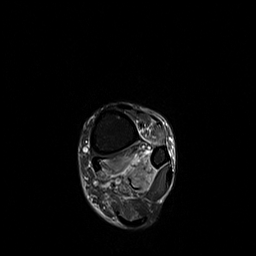

[Series 4: PD fat-sat · axial · 3.0mm · 0.66mm/px · z∈[-113,+25]mm · 11 of 40 slices shown]
[im 1/40]
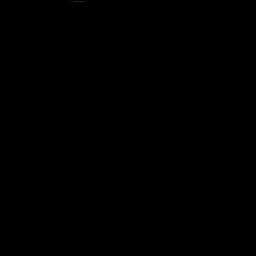
[im 4/40]
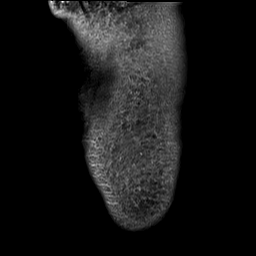
[im 8/40]
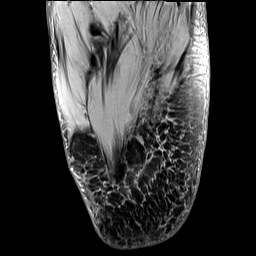
[im 12/40]
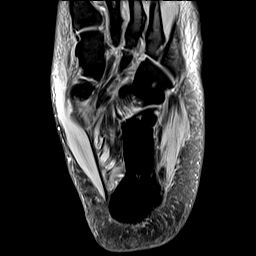
[im 16/40]
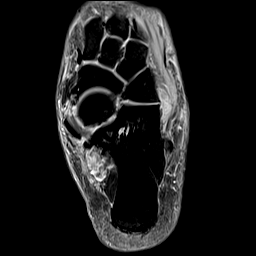
[im 20/40]
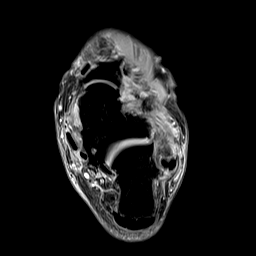
[im 24/40]
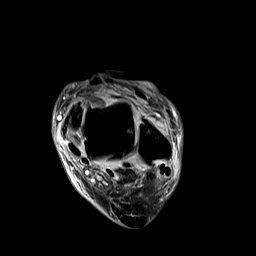
[im 28/40]
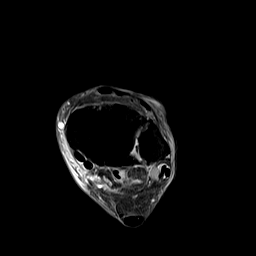
[im 32/40]
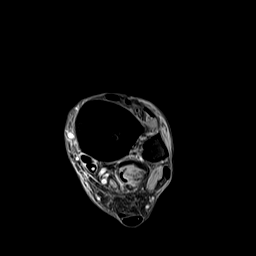
[im 36/40]
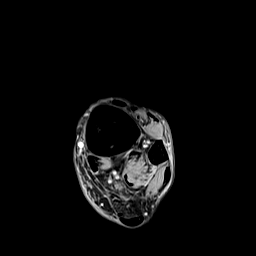
[im 40/40]
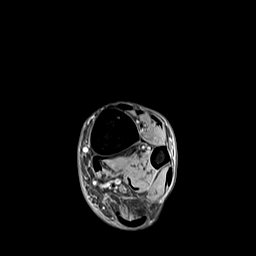

[Series 5: T1 · sagittal · 3.0mm · 0.33mm/px · 9 of 34 slices shown]
[im 1/34]
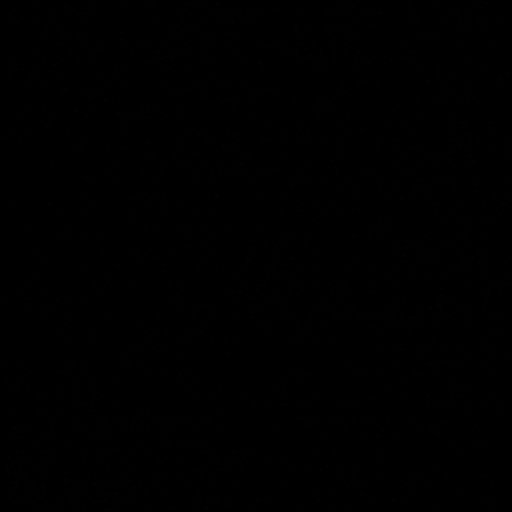
[im 5/34]
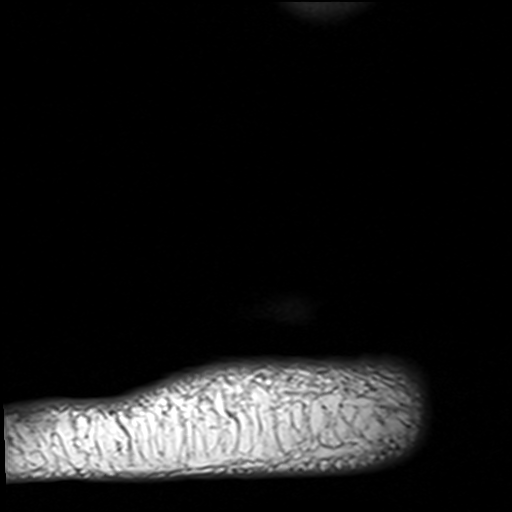
[im 9/34]
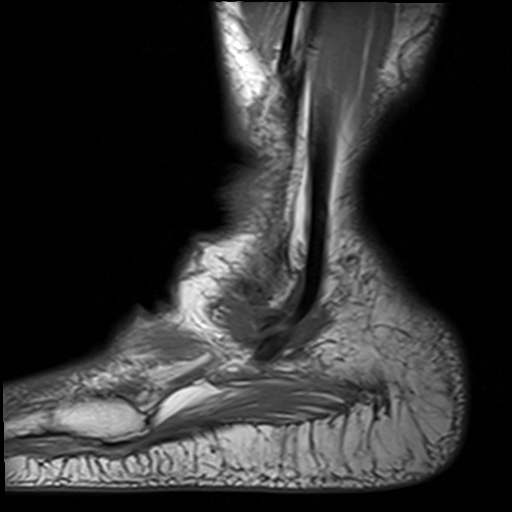
[im 13/34]
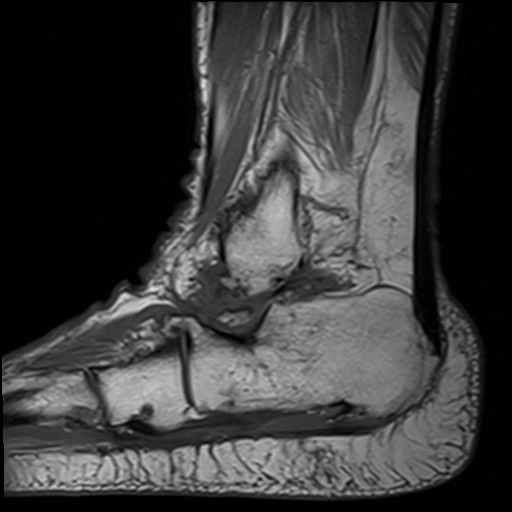
[im 17/34]
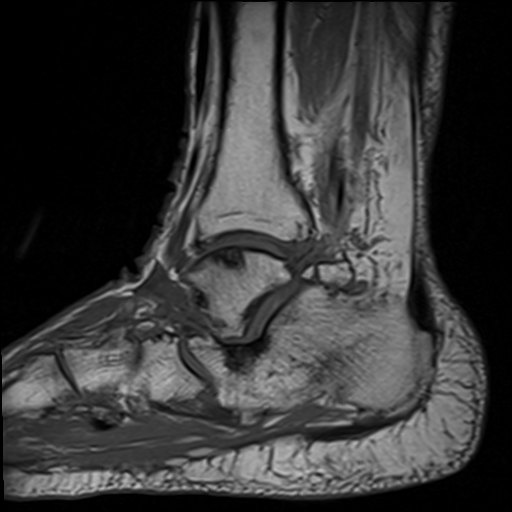
[im 21/34]
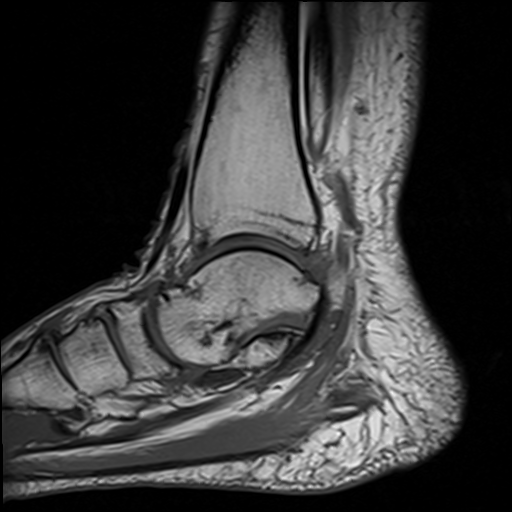
[im 25/34]
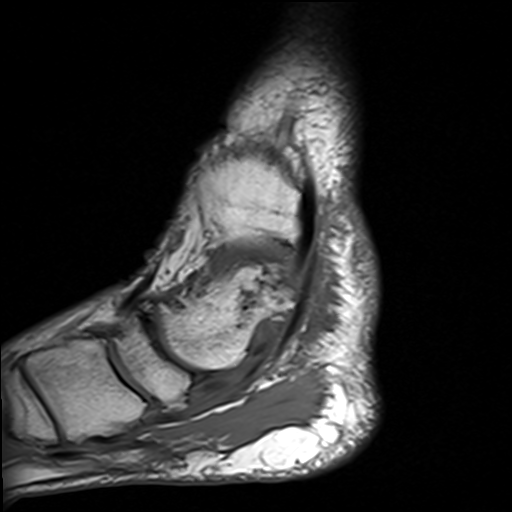
[im 29/34]
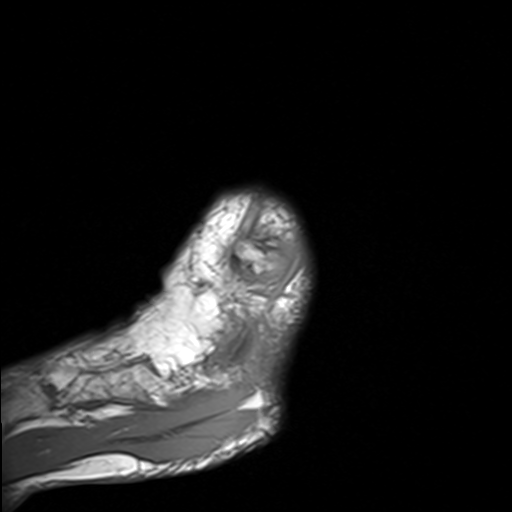
[im 34/34]
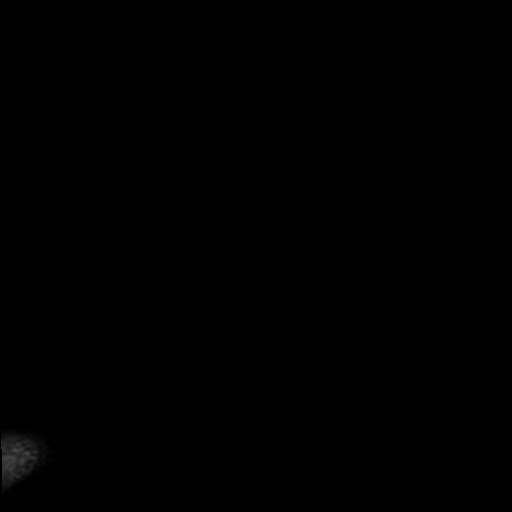

[Series 6: STIR · sagittal · 3.0mm · 0.66mm/px · 9 of 34 slices shown]
[im 1/34]
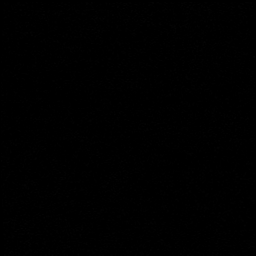
[im 5/34]
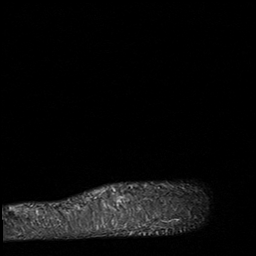
[im 9/34]
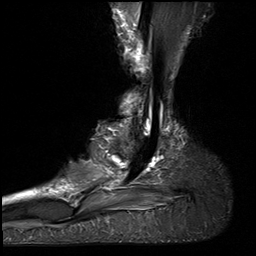
[im 13/34]
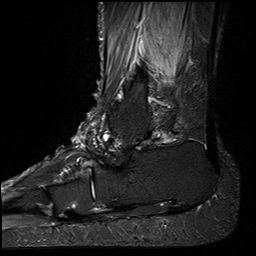
[im 17/34]
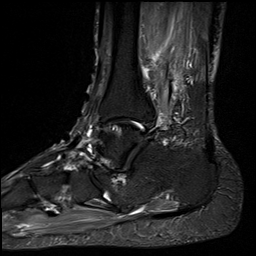
[im 21/34]
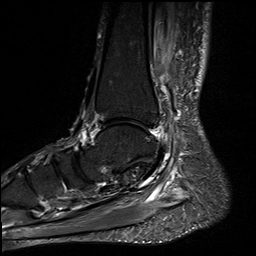
[im 25/34]
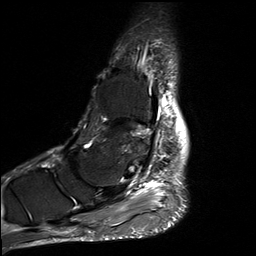
[im 29/34]
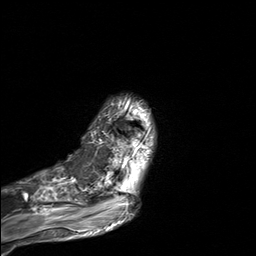
[im 34/34]
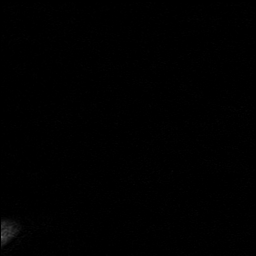

[40 of 40 positions shown; findings below may reference images not displayed]

FINDINGS: Examination is somewhat limited as the patient could not finish the
examination. Coronal images were not obtained.

TENDONS

Peroneal: Moderate tenosynovitis but no tendinopathy or tear.

Posteromedial: Moderate tenosynovitis involving the posterior
tibialis tendon but no tear/rupture. There is a longitudinal split
type tear involving the flexor digitorum longus tendon above the
level of the ankle mortise.

Anterior: Intact

Achilles: Mild Achilles tendinopathy but no tear/rupture.

Plantar Fascia: Intact.

LIGAMENTS

Lateral: Intact

Medial: Intact.

CARTILAGE

Ankle Joint: Moderate degenerative changes with joint space
narrowing and degenerative chondrosis. Mild subchondral cystic
change and early spurring changes. Small joint effusion.

Subtalar Joints/Sinus Tarsi: Moderate subtalar joint degenerative
changes mainly involving the middle talocalcaneal facet with joint
space narrowing, degenerative chondrosis and spurring. There are
also areas of subchondral cystic change.

The regions sinus tarsi is somewhat compressed due to hindfoot
valgus, slight vertical orientation of the talus and pes planus.

Bones: No fractures or bone lesions. No osteochondral abnormalities.
Age advanced midfoot degenerative changes are noted.

Other: Moderate edema like signal abnormality in the flexor hallucis
longus muscle could represent a muscle sprain or partial tear. There
is also edema like signal changes in the short flexor muscles of the
foot, most notably the abductor hallucis muscle.
IMPRESSION: 1. Age advanced degenerative changes involving the ankle, hindfoot
and midfoot as detailed above.
2. Hindfoot valgus and associated mild vertical orientation of the
talus and pes planus.
3. Mild tenosynovitis involving the peroneal tendons and posterior
tibialis tendon.
4. Longitudinal split type tear involving the flexor digitorum
longus tendon above the level of the ankle mortise.
5. Intact medial and lateral ankle ligaments.
6. Patchy areas of muscle edema could be stress related or
neurogenic.

## 2021-02-15 ENCOUNTER — Other Ambulatory Visit: Payer: Self-pay | Admitting: Family Medicine

## 2021-02-15 DIAGNOSIS — E876 Hypokalemia: Secondary | ICD-10-CM

## 2021-02-22 ENCOUNTER — Other Ambulatory Visit: Payer: Self-pay | Admitting: Family Medicine

## 2021-02-22 DIAGNOSIS — K219 Gastro-esophageal reflux disease without esophagitis: Secondary | ICD-10-CM

## 2021-03-16 ENCOUNTER — Other Ambulatory Visit: Payer: Self-pay | Admitting: Family Medicine

## 2021-03-16 DIAGNOSIS — E876 Hypokalemia: Secondary | ICD-10-CM

## 2021-04-09 ENCOUNTER — Other Ambulatory Visit: Payer: Self-pay | Admitting: Family Medicine

## 2021-04-09 DIAGNOSIS — E876 Hypokalemia: Secondary | ICD-10-CM

## 2021-05-09 ENCOUNTER — Other Ambulatory Visit: Payer: Self-pay | Admitting: Medical

## 2021-05-09 DIAGNOSIS — E876 Hypokalemia: Secondary | ICD-10-CM

## 2021-05-28 ENCOUNTER — Other Ambulatory Visit: Payer: Self-pay | Admitting: Family Medicine

## 2021-05-28 DIAGNOSIS — K219 Gastro-esophageal reflux disease without esophagitis: Secondary | ICD-10-CM

## 2021-05-31 ENCOUNTER — Other Ambulatory Visit: Payer: Self-pay | Admitting: Family Medicine

## 2021-05-31 DIAGNOSIS — E876 Hypokalemia: Secondary | ICD-10-CM

## 2021-06-12 ENCOUNTER — Other Ambulatory Visit: Payer: Self-pay | Admitting: Cardiology

## 2021-07-01 ENCOUNTER — Ambulatory Visit: Payer: Medicare HMO | Admitting: Family Medicine

## 2021-07-01 ENCOUNTER — Other Ambulatory Visit: Payer: Self-pay | Admitting: Cardiology

## 2021-07-01 DIAGNOSIS — I10 Essential (primary) hypertension: Secondary | ICD-10-CM

## 2021-07-10 ENCOUNTER — Other Ambulatory Visit: Payer: Self-pay | Admitting: Cardiology

## 2021-07-23 ENCOUNTER — Other Ambulatory Visit: Payer: Self-pay | Admitting: Cardiology

## 2021-07-23 ENCOUNTER — Other Ambulatory Visit: Payer: Self-pay

## 2021-07-23 DIAGNOSIS — E876 Hypokalemia: Secondary | ICD-10-CM

## 2021-07-23 DIAGNOSIS — I10 Essential (primary) hypertension: Secondary | ICD-10-CM

## 2021-07-23 MED ORDER — POTASSIUM CHLORIDE CRYS ER 20 MEQ PO TBCR
20.0000 meq | EXTENDED_RELEASE_TABLET | Freq: Every day | ORAL | 2 refills | Status: DC
Start: 2021-07-23 — End: 2021-09-27

## 2021-07-23 MED ORDER — NEBIVOLOL HCL 10 MG PO TABS: ORAL_TABLET | ORAL | 0 refills | Status: DC

## 2021-07-25 ENCOUNTER — Other Ambulatory Visit: Payer: Self-pay | Admitting: Cardiology

## 2021-07-25 DIAGNOSIS — I10 Essential (primary) hypertension: Secondary | ICD-10-CM

## 2021-08-04 ENCOUNTER — Other Ambulatory Visit: Payer: Self-pay | Admitting: Cardiology

## 2021-08-04 DIAGNOSIS — I10 Essential (primary) hypertension: Secondary | ICD-10-CM

## 2021-08-24 ENCOUNTER — Other Ambulatory Visit: Payer: Self-pay | Admitting: Cardiology

## 2021-08-24 DIAGNOSIS — I10 Essential (primary) hypertension: Secondary | ICD-10-CM

## 2021-08-26 ENCOUNTER — Other Ambulatory Visit: Payer: Self-pay

## 2021-08-26 DIAGNOSIS — K219 Gastro-esophageal reflux disease without esophagitis: Secondary | ICD-10-CM

## 2021-08-26 DIAGNOSIS — I10 Essential (primary) hypertension: Secondary | ICD-10-CM

## 2021-08-26 MED ORDER — NEBIVOLOL HCL 10 MG PO TABS: ORAL_TABLET | ORAL | 0 refills | Status: DC

## 2021-08-26 MED ORDER — OMEPRAZOLE 20 MG PO CPDR: DELAYED_RELEASE_CAPSULE | ORAL | 0 refills | Status: DC

## 2021-09-03 ENCOUNTER — Other Ambulatory Visit: Payer: Self-pay | Admitting: Cardiology

## 2021-09-04 ENCOUNTER — Other Ambulatory Visit: Payer: Self-pay | Admitting: Cardiology

## 2021-09-04 DIAGNOSIS — I10 Essential (primary) hypertension: Secondary | ICD-10-CM

## 2021-09-11 ENCOUNTER — Other Ambulatory Visit: Payer: Self-pay | Admitting: Cardiology

## 2021-09-18 ENCOUNTER — Telehealth: Payer: Self-pay | Admitting: Family Medicine

## 2021-09-18 NOTE — Telephone Encounter (Signed)
Left message for patient to call back and schedule Medicare Annual Wellness Visit (AWV) either virtually or in office. I left my number for patient to call [phone].  Last AWV 06/28/20  please schedule at anytime with health coach  This should be a 45 minute visit.

## 2021-09-27 ENCOUNTER — Telehealth: Payer: Self-pay | Admitting: Internal Medicine

## 2021-09-27 ENCOUNTER — Ambulatory Visit (INDEPENDENT_AMBULATORY_CARE_PROVIDER_SITE_OTHER): Payer: Medicare HMO

## 2021-09-27 ENCOUNTER — Other Ambulatory Visit: Payer: Self-pay | Admitting: Family Medicine

## 2021-09-27 ENCOUNTER — Other Ambulatory Visit: Payer: Self-pay

## 2021-09-27 VITALS — BP 150/100 | HR 76 | Temp 97.5°F | Ht 68.0 in | Wt 337.2 lb

## 2021-09-27 DIAGNOSIS — I10 Essential (primary) hypertension: Secondary | ICD-10-CM

## 2021-09-27 DIAGNOSIS — K219 Gastro-esophageal reflux disease without esophagitis: Secondary | ICD-10-CM

## 2021-09-27 DIAGNOSIS — E876 Hypokalemia: Secondary | ICD-10-CM

## 2021-09-27 DIAGNOSIS — Z0000 Encounter for general adult medical examination without abnormal findings: Secondary | ICD-10-CM | POA: Diagnosis not present

## 2021-09-27 MED ORDER — FUROSEMIDE 80 MG PO TABS: ORAL_TABLET | ORAL | 0 refills | Status: DC

## 2021-09-27 MED ORDER — NEBIVOLOL HCL 10 MG PO TABS: ORAL_TABLET | ORAL | 0 refills | Status: DC

## 2021-09-27 MED ORDER — POTASSIUM CHLORIDE CRYS ER 20 MEQ PO TBCR: 20.0000 meq | EXTENDED_RELEASE_TABLET | Freq: Every day | ORAL | 0 refills | Status: DC

## 2021-09-27 MED ORDER — ATORVASTATIN CALCIUM 40 MG PO TABS: 40.0000 mg | ORAL_TABLET | Freq: Every day | ORAL | 0 refills | Status: DC

## 2021-09-27 NOTE — Patient Instructions (Signed)
[name] , Thank you for taking time to come for your Medicare Wellness Visit. I appreciate your ongoing commitment to your health goals. Please review the following plan we discussed and let me know if I can assist you in the future.   Screening recommendations/referrals: Colonoscopy: completed 04/22/2013 Recommended yearly ophthalmology/optometry visit for glaucoma screening and checkup Recommended yearly dental visit for hygiene and checkup  Vaccinations: Influenza vaccine: decline Pneumococcal vaccine: n/a Tdap vaccine: completed 02/16/2014, due 02/17/2024 Shingles vaccine: discussed   Covid-19:  11/21/2020, 04/17/2020, 03/27/2020  Advanced directives: copy in chart  Conditions/risks identified: none  Next appointment: Follow up in one year for your annual wellness visit   Preventive Care 40-64 Years, Male Preventive care refers to lifestyle choices and visits with your health care provider that can promote health and wellness. What does preventive care include? A yearly physical exam. This is also called an annual well check. Dental exams once or twice a year. Routine eye exams. Ask your health care provider how often you should have your eyes checked. Personal lifestyle choices, including: Daily care of your teeth and gums. Regular physical activity. Eating a healthy diet. Avoiding tobacco and drug use. Limiting alcohol use. Practicing safe sex. Taking low-dose aspirin every day starting at age 23. What happens during an annual well check? The services and screenings done by your health care provider during your annual well check will depend on your age, overall health, lifestyle risk factors, and family history of disease. Counseling  Your health care provider may ask you questions about your: Alcohol use. Tobacco use. Drug use. Emotional well-being. Home and relationship well-being. Sexual activity. Eating habits. Work and work Astronomer. Screening  You may have  the following tests or measurements: Height, weight, and BMI. Blood pressure. Lipid and cholesterol levels. These may be checked every 5 years, or more frequently if you are over 77 years old. Skin check. Lung cancer screening. You may have this screening every year starting at age 49 if you have a 30-pack-year history of smoking and currently smoke or have quit within the past 15 years. Fecal occult blood test (FOBT) of the stool. You may have this test every year starting at age 10. Flexible sigmoidoscopy or colonoscopy. You may have a sigmoidoscopy every 5 years or a colonoscopy every 10 years starting at age 16. Prostate cancer screening. Recommendations will vary depending on your family history and other risks. Hepatitis C blood test. Hepatitis B blood test. Sexually transmitted disease (STD) testing. Diabetes screening. This is done by checking your blood sugar (glucose) after you have not eaten for a while (fasting). You may have this done every 1-3 years. Discuss your test results, treatment options, and if necessary, the need for more tests with your health care provider. Vaccines  Your health care provider may recommend certain vaccines, such as: Influenza vaccine. This is recommended every year. Tetanus, diphtheria, and acellular pertussis (Tdap, Td) vaccine. You may need a Td booster every 10 years. Zoster vaccine. You may need this after age 42. Pneumococcal 13-valent conjugate (PCV13) vaccine. You may need this if you have certain conditions and have not been vaccinated. Pneumococcal polysaccharide (PPSV23) vaccine. You may need one or two doses if you smoke cigarettes or if you have certain conditions. Talk to your health care provider about which screenings and vaccines you need and how often you need them. This information is not intended to replace advice given to you by your health care provider. Make sure you discuss any  questions you have with your health care  provider. Document Released: 09/21/2015 Document Revised: 05/14/2016 Document Reviewed: 06/26/2015 Elsevier Interactive Patient Education  2017 [employer].  Fall Prevention in the Home Falls can cause injuries. They can happen to people of all ages. There are many things you can do to make your home safe and to help prevent falls. What can I do on the outside of my home? Regularly fix the edges of walkways and driveways and fix any cracks. Remove anything that might make you trip as you walk through a door, such as a raised step or threshold. Trim any bushes or trees on the path to your home. Use bright outdoor lighting. Clear any walking paths of anything that might make someone trip, such as rocks or tools. Regularly check to see if handrails are loose or broken. Make sure that both sides of any steps have handrails. Any raised decks and porches should have guardrails on the edges. Have any leaves, snow, or ice cleared regularly. Use sand or salt on walking paths during winter. Clean up any spills in your garage right away. This includes oil or grease spills. What can I do in the bathroom? Use night lights. Install grab bars by the toilet and in the tub and shower. Do not use towel bars as grab bars. Use non-skid mats or decals in the tub or shower. If you need to sit down in the shower, use a plastic, non-slip stool. Keep the floor dry. Clean up any water that spills on the floor as soon as it happens. Remove soap buildup in the tub or shower regularly. Attach bath mats securely with double-sided non-slip rug tape. Do not have throw rugs and other things on the floor that can make you trip. What can I do in the bedroom? Use night lights. Make sure that you have a light by your bed that is easy to reach. Do not use any sheets or blankets that are too big for your bed. They should not hang down onto the floor. Have a firm chair that has side arms. You can use this for support while  you get dressed. Do not have throw rugs and other things on the floor that can make you trip. What can I do in the kitchen? Clean up any spills right away. Avoid walking on wet floors. Keep items that you use a lot in easy-to-reach places. If you need to reach something above you, use a strong step stool that has a grab bar. Keep electrical cords out of the way. Do not use floor polish or wax that makes floors slippery. If you must use wax, use non-skid floor wax. Do not have throw rugs and other things on the floor that can make you trip. What can I do with my stairs? Do not leave any items on the stairs. Make sure that there are handrails on both sides of the stairs and use them. Fix handrails that are broken or loose. Make sure that handrails are as long as the stairways. Check any carpeting to make sure that it is firmly attached to the stairs. Fix any carpet that is loose or worn. Avoid having throw rugs at the top or bottom of the stairs. If you do have throw rugs, attach them to the floor with carpet tape. Make sure that you have a light switch at the top of the stairs and the bottom of the stairs. If you do not have them, ask someone to add them  for you. What else can I do to help prevent falls? Wear shoes that: Do not have high heels. Have rubber bottoms. Are comfortable and fit you well. Are closed at the toe. Do not wear sandals. If you use a stepladder: Make sure that it is fully opened. Do not climb a closed stepladder. Make sure that both sides of the stepladder are locked into place. Ask someone to hold it for you, if possible. Clearly mark and make sure that you can see: Any grab bars or handrails. First and last steps. Where the edge of each step is. Use tools that help you move around (mobility aids) if they are needed. These include: Canes. Walkers. Scooters. Crutches. Turn on the lights when you go into a dark area. Replace any light bulbs as soon as they burn  out. Set up your furniture so you have a clear path. Avoid moving your furniture around. If any of your floors are uneven, fix them. If there are any pets around you, be aware of where they are. Review your medicines with your doctor. Some medicines can make you feel dizzy. This can increase your chance of falling. Ask your doctor what other things that you can do to help prevent falls. This information is not intended to replace advice given to you by your health care provider. Make sure you discuss any questions you have with your health care provider. Document Released: 06/21/2009 Document Revised: 01/31/2016 Document Reviewed: 09/29/2014 Elsevier Interactive Patient Education  2017 [employer]

## 2021-09-27 NOTE — Progress Notes (Signed)
This visit occurred during the SARS-CoV-2 public health emergency.  Safety protocols were in place, including screening questions prior to the visit, additional usage of staff PPE, and extensive cleaning of exam room while observing appropriate contact time as indicated for disinfecting solutions.  Subjective:   [name] is a 61 y.o. male who presents for Medicare Annual/Subsequent preventive examination.  Review of Systems     Cardiac Risk Factors include: advanced age (>64men, >9 women);hypertension;male gender;obesity (BMI >30kg/m2)     Objective:    Today's Vitals   09/27/21 1103 09/27/21 1109  BP: (!) 150/100   Pulse: 76   Temp: (!) 97.5 F (36.4 C)   TempSrc: Oral   SpO2: 96%   Weight: (!) 337 lb 3.2 oz (153 kg)   Height: 5\' 8"  (1.727 m)   PainSc:  5    Body mass index is 51.27 kg/m.  Advanced Directives 09/27/2021 04/22/2013 04/15/2013  Does Patient Have a Medical Advance Directive? Yes - Patient does not have advance directive;Patient would like information  Type of Advance Directive Out of facility DNR (pink MOST or yellow form) - -  Would patient like information on creating a medical advance directive? - - Advance directive packet given  Pre-existing out of facility DNR order (yellow form or pink MOST form) - No No    Current Medications (verified) Outpatient Encounter Medications as of 09/27/2021  Medication Sig   atorvastatin (LIPITOR) 40 MG tablet Take 1 tablet (40 mg total) by mouth daily. Please make overdue appt with Dr. Marlou Porch before anymore refills. Thank you 3rd and Final Attempt   furosemide (LASIX) 80 MG tablet TAKE 1/2 TABLET BY MOUTH TWICE A DAY. PLEASE MAKE APPT WITH DR. Marlou Porch BEFORE ANYMORE REFILLS. 3rd and final attempt   nebivolol (BYSTOLIC) 10 MG tablet TAKE 1 TABLET BY MOUTH EVERY DAY IN THE MORNING   omeprazole (PRILOSEC) 20 MG capsule TAKE 1 CAPSULE BY MOUTH EVERY DAY   potassium chloride SA (KLOR-CON M20) 20 MEQ tablet Take 1 tablet (20  mEq total) by mouth daily.   No facility-administered encounter medications on file as of 09/27/2021.    Allergies (verified) Tomato   History: Past Medical History:  Diagnosis Date   Asthma    mild, no inhaler used   Bilateral renal cysts 04/25/2019   CHF (congestive heart failure) (HCC)    DJD (degenerative joint disease)    has taken tramadol 50mg  q8h in past   GERD (gastroesophageal reflux disease)    Hyperlipidemia    Hypertension    Morbid obesity ([place])    weighed in excess of 500 lbs at one point   OSA (obstructive sleep apnea)    did not tolerate CPAP, uses nasal cannula   Proteinuria    GFR 94.12 February 2015   Past Surgical History:  Procedure Laterality Date   COLONOSCOPY N/A 04/22/2013   Procedure: COLONOSCOPY;  Surgeon: Shann Medal, MD;  Location: [facility];  Service: General;  Laterality: N/A;   leg surgery for fracture Right 2008 or 2009   SPHINCTEROTOMY Left 04/22/2013   Procedure: Exam under anesthesia, left lateral internal SPHINCTEROTOMY;  Surgeon: Shann Medal, MD;  Location: [facility];  Service: General;  Laterality: Left;   Family History  Problem Relation Age of Onset   Heart disease Father        MI, DM2, HTN   Hypertension Mother        78 DM2, HTN   Cancer Mother    Diabetes Brother  Stroke Brother    Colon cancer Neg Hx    Esophageal cancer Neg Hx    Inflammatory bowel disease Neg Hx    Liver disease Neg Hx    Pancreatic cancer Neg Hx    Rectal cancer Neg Hx    Stomach cancer Neg Hx    Social History   Socioeconomic History   Marital status: Single    Spouse name: Not on file   Number of children: Not on file   Years of education: Not on file   Highest education level: Not on file  Occupational History   Not on file  Tobacco Use   Smoking status: Former    Packs/day: 0.20    Years: 1.50    Pack years: 0.30    Types: Cigarettes    Quit date: 81    Years since quitting: 41.0   Smokeless tobacco: Never  Vaping Use   Vaping  Use: Never used  Substance and Sexual Activity   Alcohol use: Yes    Comment: 1/5 on weekend    Drug use: No   Sexual activity: Yes    Partners: Female  Other Topics Concern   Not on file  Social History Narrative   Not on file   Social Determinants of Health   Financial Resource Strain: Medium Risk   Difficulty of Paying Living Expenses: Somewhat hard  Food Insecurity: No Food Insecurity   Worried About Charity fundraiser in the Last Year: Never true   Ran Out of Food in the Last Year: Never true  Transportation Needs: No Transportation Needs   Lack of Transportation (Medical): No   Lack of Transportation (Non-Medical): No  Physical Activity: Inactive   Days of Exercise per Week: 0 days   Minutes of Exercise per Session: 0 min  Stress: Stress Concern Present   Feeling of Stress : Rather much  Social Connections: Not on file    Tobacco Counseling Counseling given: Not Answered   Clinical Intake:  Pre-visit preparation completed: Yes  Pain : 0-10 Pain Score: 5  Pain Type: Chronic pain Pain Location: Knee Pain Orientation: Right, Left Pain Descriptors / Indicators: Aching Pain Onset: More than a month ago Pain Frequency: Intermittent     Nutritional Status: BMI > 30  Obese Nutritional Risks: None Diabetes: No  How often do you need to have someone help you when you read instructions, pamphlets, or other written materials from your doctor or pharmacy?: 1 - Never What is the last grade level you completed in school?: 12th grade  Diabetic? no  Interpreter Needed?: No  Information entered by :: NAllen LPN   Activities of Daily Living In your present state of health, do you have any difficulty performing the following activities: 09/27/2021  Hearing? N  Vision? N  Difficulty concentrating or making decisions? N  Walking or climbing stairs? Y  Dressing or bathing? N  Doing errands, shopping? N  Preparing Food and eating ? N  Using the Toilet? N  In the  past six months, have you accidently leaked urine? N  Do you have problems with loss of bowel control? N  Managing your Medications? N  Managing your Finances? N  Housekeeping or managing your Housekeeping? N  Some recent data might be hidden    Patient Care Team: Davy Pique as PCP - General (Family Medicine) Jerline Pain, MD as PCP - Cardiology (Cardiology)  Indicate any recent Medical Services you may have received from other than  [facility] providers in the past year (date may be approximate).     Assessment:   This is a routine wellness examination for [name].  Hearing/Vision screen Vision Screening - Comments:: No regular eye exams,   Dietary issues and exercise activities discussed: Current Exercise Habits: The patient does not participate in regular exercise at present   Goals Addressed             This Visit's Progress    Patient Stated       09/27/2021, lose weight       Depression Screen PHQ 2/9 Scores 09/27/2021 01/07/2021 06/28/2020 06/20/2019 06/17/2018 08/13/2017  PHQ - 2 Score 0 0 0 0 0 0    Fall Risk Fall Risk  09/27/2021 01/07/2021 06/28/2020 06/20/2019 06/17/2018  Falls in the past year? 0 0 0 0 No  Number falls in past yr: - 0 0 0 -  Injury with Fall? - 0 0 0 -  Risk for fall due to : Medication side effect No Fall Risks - - -  Follow up Falls evaluation completed;Education provided;Falls prevention discussed Falls evaluation completed - - -    FALL RISK PREVENTION PERTAINING TO THE HOME:  Any stairs in or around the home? Yes  If so, are there any without handrails? No  Home free of loose throw rugs in walkways, pet beds, electrical cords, etc? Yes  Adequate lighting in your home to reduce risk of falls? Yes   ASSISTIVE DEVICES UTILIZED TO PREVENT FALLS:  Life alert? No  Use of a cane, walker or w/c? No  Grab bars in the bathroom? No  Shower chair or bench in shower? No  Elevated toilet seat or a handicapped toilet? Yes   TIMED UP  AND GO:  Was the test performed? No .    Gait slow and steady without use of assistive device  Cognitive Function:     6CIT Screen 09/27/2021  What Year? 0 points  What month? 0 points  What time? 0 points  Count back from 20 0 points  Months in reverse 4 points  Repeat phrase 8 points  Total Score 12    Immunizations Immunization History  Administered Date(s) Administered   Influenza-Unspecified 07/01/2011, 07/19/2012   PFIZER Comirnaty(Gray Top)Covid-19 Tri-Sucrose Vaccine 11/21/2020   PFIZER(Purple Top)SARS-COV-2 Vaccination 03/27/2020, 04/17/2020   Pneumococcal Polysaccharide-23 11/03/2011   Td 11/03/2011   Tdap 02/16/2014    TDAP status: Up to date  Flu Vaccine status: Declined, Education has been provided regarding the importance of this vaccine but patient still declined. Advised may receive this vaccine at local pharmacy or Health Dept. Aware to provide a copy of the vaccination record if obtained from local pharmacy or Health Dept. Verbalized acceptance and understanding.  Pneumococcal vaccine status: Up to date  Covid-19 vaccine status: Completed vaccines  Qualifies for Shingles Vaccine? Yes   Zostavax completed No   Shingrix Completed?: No.    Education has been provided regarding the importance of this vaccine. Patient has been advised to call insurance company to determine out of pocket expense if they have not yet received this vaccine. Advised may also receive vaccine at local pharmacy or Health Dept. Verbalized acceptance and understanding.  Screening Tests Health Maintenance  Topic Date Due   Zoster Vaccines- Shingrix (1 of 2) Never done   COVID-19 Vaccine (4 - Booster for Pfizer series) 01/16/2021   COLONOSCOPY (Pts 45-76yrs Insurance coverage will need to be confirmed)  04/23/2023   TETANUS/TDAP  02/17/2024   Hepatitis C  Screening  Completed   HIV Screening  Completed   HPV VACCINES  Aged Out   INFLUENZA VACCINE  Discontinued    Health  Maintenance  Health Maintenance Due  Topic Date Due   Zoster Vaccines- Shingrix (1 of 2) Never done   COVID-19 Vaccine (4 - Booster for Pfizer series) 01/16/2021    Colorectal cancer screening: Type of screening: Colonoscopy. Completed 04/22/2013. Repeat every 10 years  Lung Cancer Screening: (Low Dose CT Chest recommended if Age 62-80 years, 30 pack-year currently smoking OR have quit w/in 15years.) does not qualify.   Lung Cancer Screening Referral: no  Additional Screening:  Hepatitis C Screening: does qualify; Completed 06/17/2018  Vision Screening: Recommended annual ophthalmology exams for early detection of glaucoma and other disorders of the eye. Is the patient up to date with their annual eye exam?  No  Who is the provider or what is the name of the office in which the patient attends annual eye exams? Can't remember name If pt is not established with a provider, would they like to be referred to a provider to establish care? No .   Dental Screening: Recommended annual dental exams for proper oral hygiene  Community Resource Referral / Chronic Care Management: CRR required this visit?  No   CCM required this visit?  No      Plan:     I have personally reviewed and noted the following in the patients chart:   Medical and social history Use of alcohol, tobacco or illicit drugs  Current medications and supplements including opioid prescriptions. Patient is not currently taking opioid prescriptions. Functional ability and status Nutritional status Physical activity Advanced directives List of other physicians Hospitalizations, surgeries, and ER visits in previous 12 months Vitals Screenings to include cognitive, depression, and falls Referrals and appointments  In addition, I have reviewed and discussed with patient certain preventive protocols, quality metrics, and best practice recommendations. A written personalized care plan for preventive services as well  as general preventive health recommendations were provided to patient.     Kellie Simmering, LPN   [id number]   Nurse Notes: none

## 2021-09-27 NOTE — Telephone Encounter (Signed)
-----   Message from Barb Merino, LPN sent at 9/93/5701 11:26 AM EST ----- Patient needs refills on atorvastatin, furosemide, nebivolol, and klor con. Thanks

## 2021-09-27 NOTE — Telephone Encounter (Signed)
Refilled for 30 days and advised Nickeah to tell him to schedule a fasting visit with lynne within 30 days

## 2021-10-10 ENCOUNTER — Encounter: Payer: Medicare HMO | Admitting: Physician Assistant

## 2021-10-10 ENCOUNTER — Encounter: Payer: Self-pay | Admitting: Physician Assistant

## 2021-10-10 ENCOUNTER — Ambulatory Visit (INDEPENDENT_AMBULATORY_CARE_PROVIDER_SITE_OTHER): Payer: Medicare HMO | Admitting: Physician Assistant

## 2021-10-10 ENCOUNTER — Other Ambulatory Visit: Payer: Self-pay

## 2021-10-10 VITALS — BP 140/88 | HR 73 | Ht 66.5 in | Wt 328.8 lb

## 2021-10-10 DIAGNOSIS — R7989 Other specified abnormal findings of blood chemistry: Secondary | ICD-10-CM | POA: Diagnosis not present

## 2021-10-10 DIAGNOSIS — R600 Localized edema: Secondary | ICD-10-CM

## 2021-10-10 DIAGNOSIS — K219 Gastro-esophageal reflux disease without esophagitis: Secondary | ICD-10-CM | POA: Diagnosis not present

## 2021-10-10 DIAGNOSIS — E782 Mixed hyperlipidemia: Secondary | ICD-10-CM

## 2021-10-10 DIAGNOSIS — Z125 Encounter for screening for malignant neoplasm of prostate: Secondary | ICD-10-CM | POA: Diagnosis not present

## 2021-10-10 MED ORDER — ATORVASTATIN CALCIUM 40 MG PO TABS: 40.0000 mg | ORAL_TABLET | Freq: Every day | ORAL | 2 refills | Status: DC

## 2021-10-10 NOTE — Progress Notes (Signed)
Subjective:    Patient ID: [name], male    DOB: 1961/06/12, 61 y.o.   MRN: [id number]  This is a 61 yo male with a PMH of multiple medical problems including CHF, pedal edema, HLD, HTN, asthma, OSA who presents for a follow up appointment. He denies any chest pain, SOB, or HA. Denies any new issues.   Hyperlipidemia Pertinent negatives include no chest pain.  Chief Complaint  Patient presents with   Hyperlipidemia    Fasting for labs   He is here for a medication management visit.   HTN- on Bystolic and Lasix. Sees cardiology. No concerns with medication.   Denies fever, chills, dizziness, chest pain, palpitations, shortness of breath, N/V/D, urinary symptoms, LE edema.     Review of Systems  Constitutional:  Negative for activity change.  HENT:  Negative for congestion and hearing loss.   Eyes:  Negative for redness.  Respiratory:  Negative for cough.   Cardiovascular:  Negative for chest pain and leg swelling.  Gastrointestinal:  Negative for vomiting.  Endocrine: Negative for polyuria.  Genitourinary:  Negative for flank pain and frequency.  Musculoskeletal:  Negative for arthralgias and gait problem.  Skin:  Negative for color change.  Neurological:  Negative for dizziness.  Psychiatric/Behavioral:  Negative for agitation.   Pertinent positives and negatives in the history of present illness.     Objective:   Physical Exam Vitals and nursing note reviewed.  Constitutional:      General: He is not in acute distress.    Appearance: Normal appearance.  HENT:     Head: Normocephalic and atraumatic.     Right Ear: Tympanic membrane, ear canal and external ear normal.     Left Ear: Tympanic membrane, ear canal and external ear normal.     Nose: Nose normal.     Mouth/Throat:     Mouth: Mucous membranes are moist.     Pharynx: Oropharynx is clear.  Eyes:     Extraocular Movements: Extraocular movements intact.     Conjunctiva/sclera: Conjunctivae normal.      Pupils: Pupils are equal, round, and reactive to light.  Cardiovascular:     Rate and Rhythm: Normal rate and regular rhythm.     Pulses: Normal pulses.     Heart sounds: Normal heart sounds.  Pulmonary:     Effort: Pulmonary effort is normal. No respiratory distress.     Breath sounds: Normal breath sounds. No wheezing.  Abdominal:     General: Bowel sounds are normal.  Musculoskeletal:        General: Normal range of motion.     Cervical back: Normal range of motion and neck supple.     Right lower leg: No edema.     Left lower leg: No edema.  Skin:    General: Skin is warm and dry.  Neurological:     General: No focal deficit present.     Mental Status: He is alert.  Psychiatric:        Mood and Affect: Mood normal.        Behavior: Behavior normal.        Thought Content: Thought content normal.   BP 140/88 (BP Location: Right Arm, Patient Position: Sitting)    Pulse 73    Ht 5' 6.5" (1.689 m)    Wt (!) 328 lb 12.8 oz (149.1 kg)    SpO2 98%    BMI 52.27 kg/m   Alert and oriented and in no  acute distress. Respirations unlabored. Normal speech, mood and thought process.      Assessment & Plan:  Mixed hyperlipidemia - Plan: atorvastatin (LIPITOR) 40 MG tablet, Lipid Panel  GERD without esophagitis  Elevated serum creatinine - Plan: Comprehensive metabolic panel  Screening for prostate cancer - Plan: PSA  Pedal edema  Hyperlipidemia - stable, continue current management, low fat diet  GERD - stable, avoid stomach irritants  Elevated serum creatinine - stable, advised to drink plain water only when coming in for fasting labs  Screening for prostate cancer - stable, PSA ordered today

## 2021-10-11 LAB — COMPREHENSIVE METABOLIC PANEL
ALT: 13 IU/L (ref 0–44)
AST: 21 IU/L (ref 0–40)
Albumin/Globulin Ratio: 1.5 (ref 1.2–2.2)
Albumin: 4.1 g/dL (ref 3.8–4.9)
Alkaline Phosphatase: 58 IU/L (ref 44–121)
BUN/Creatinine Ratio: 17 (ref 10–24)
BUN: 20 mg/dL (ref 8–27)
Bilirubin Total: 0.5 mg/dL (ref 0.0–1.2)
CO2: 26 mmol/L (ref 20–29)
Calcium: 9.3 mg/dL (ref 8.6–10.2)
Chloride: 97 mmol/L (ref 96–106)
Creatinine, Ser: 1.17 mg/dL (ref 0.76–1.27)
Globulin, Total: 2.8 g/dL (ref 1.5–4.5)
Glucose: 86 mg/dL (ref 70–99)
Potassium: 4.1 mmol/L (ref 3.5–5.2)
Sodium: 137 mmol/L (ref 134–144)
Total Protein: 6.9 g/dL (ref 6.0–8.5)
eGFR: 71 mL/min/{1.73_m2} (ref >59)

## 2021-10-11 LAB — PSA: Prostate Specific Ag, Serum: 0.7 ng/mL (ref 0.0–4.0)

## 2021-10-11 LAB — LIPID PANEL
Chol/HDL Ratio: 2.9 ratio (ref 0.0–5.0)
Cholesterol, Total: 185 mg/dL (ref 100–199)
HDL: 64 mg/dL (ref >39)
LDL Chol Calc (NIH): 105 mg/dL — ABNORMAL HIGH (ref 0–99)
Triglycerides: 91 mg/dL (ref 0–149)
VLDL Cholesterol Cal: 16 mg/dL (ref 5–40)

## 2021-10-17 NOTE — Progress Notes (Signed)
Please call patient to let him know:  Regarding your recent blood work:  Liver and kidneys are fine  Cholesterol is a little high. For high cholesterol - Increase fiber intake (Benefiber or Metamucil, Cherrios,  oatmeal, beans, nuts, fruits and vegetables), limit saturated fats (in fried foods, red meat), can add OTC fish oil supplement, eat fish with Omega-3 fatty acids like salmon and tuna, exercise for 30 minutes 3 - 5 times a week, drink 8 - 10 glasses of water a day   PSA level is fine

## 2021-10-20 ENCOUNTER — Other Ambulatory Visit: Payer: Self-pay | Admitting: Medical

## 2021-10-20 DIAGNOSIS — I10 Essential (primary) hypertension: Secondary | ICD-10-CM

## 2021-11-23 ENCOUNTER — Other Ambulatory Visit: Payer: Self-pay | Admitting: Family Medicine

## 2021-11-23 DIAGNOSIS — K219 Gastro-esophageal reflux disease without esophagitis: Secondary | ICD-10-CM

## 2022-01-10 ENCOUNTER — Other Ambulatory Visit: Payer: Self-pay | Admitting: Family Medicine

## 2022-01-10 DIAGNOSIS — E876 Hypokalemia: Secondary | ICD-10-CM

## 2022-01-10 DIAGNOSIS — I10 Essential (primary) hypertension: Secondary | ICD-10-CM

## 2022-04-03 ENCOUNTER — Ambulatory Visit: Payer: Medicare HMO | Admitting: Medical

## 2022-04-07 ENCOUNTER — Other Ambulatory Visit: Payer: Self-pay | Admitting: Physician Assistant

## 2022-04-07 DIAGNOSIS — E876 Hypokalemia: Secondary | ICD-10-CM

## 2022-04-09 ENCOUNTER — Other Ambulatory Visit: Payer: Self-pay | Admitting: Physician Assistant

## 2022-04-09 DIAGNOSIS — I10 Essential (primary) hypertension: Secondary | ICD-10-CM

## 2022-04-10 ENCOUNTER — Ambulatory Visit: Payer: Medicare HMO | Admitting: Physician Assistant

## 2022-04-23 ENCOUNTER — Other Ambulatory Visit: Payer: Self-pay | Admitting: Physician Assistant

## 2022-04-23 DIAGNOSIS — I10 Essential (primary) hypertension: Secondary | ICD-10-CM

## 2022-04-23 NOTE — Telephone Encounter (Signed)
Pt. Needs to call for apt.

## 2022-05-07 ENCOUNTER — Telehealth: Payer: Self-pay | Admitting: Medical

## 2022-05-07 ENCOUNTER — Other Ambulatory Visit: Payer: Self-pay

## 2022-05-07 ENCOUNTER — Other Ambulatory Visit: Payer: Self-pay | Admitting: Medical

## 2022-05-07 ENCOUNTER — Other Ambulatory Visit: Payer: Self-pay | Admitting: Family Medicine

## 2022-05-07 DIAGNOSIS — K219 Gastro-esophageal reflux disease without esophagitis: Secondary | ICD-10-CM

## 2022-05-07 DIAGNOSIS — I10 Essential (primary) hypertension: Secondary | ICD-10-CM

## 2022-05-07 DIAGNOSIS — E876 Hypokalemia: Secondary | ICD-10-CM

## 2022-05-07 MED ORDER — NEBIVOLOL HCL 10 MG PO TABS: ORAL_TABLET | ORAL | 0 refills | Status: DC

## 2022-05-07 NOTE — Telephone Encounter (Signed)
Pt called and is requesting a refill on his nebivolol please send to the [id number] [place], [place] - [address]

## 2022-05-10 ENCOUNTER — Emergency Department (HOSPITAL_COMMUNITY): Payer: Medicare HMO

## 2022-05-10 ENCOUNTER — Encounter (HOSPITAL_COMMUNITY): Payer: Self-pay | Admitting: Emergency Medicine

## 2022-05-10 ENCOUNTER — Other Ambulatory Visit: Payer: Self-pay

## 2022-05-10 ENCOUNTER — Emergency Department (HOSPITAL_COMMUNITY)
Admission: EM | Admit: 2022-05-10 | Discharge: 2022-05-10 | Disposition: A | Discharge status: Home / Self Care | Payer: Medicare HMO | Attending: Emergency Medicine | Admitting: Emergency Medicine

## 2022-05-10 DIAGNOSIS — Z600 Problems of adjustment to life-cycle transitions: Secondary | ICD-10-CM | POA: Diagnosis not present

## 2022-05-10 DIAGNOSIS — I110 Hypertensive heart disease with heart failure: Secondary | ICD-10-CM | POA: Diagnosis not present

## 2022-05-10 DIAGNOSIS — I10 Essential (primary) hypertension: Secondary | ICD-10-CM | POA: Diagnosis not present

## 2022-05-10 DIAGNOSIS — R519 Headache, unspecified: Secondary | ICD-10-CM | POA: Diagnosis not present

## 2022-05-10 DIAGNOSIS — E236 Other disorders of pituitary gland: Secondary | ICD-10-CM | POA: Diagnosis not present

## 2022-05-10 DIAGNOSIS — J449 Chronic obstructive pulmonary disease, unspecified: Secondary | ICD-10-CM | POA: Diagnosis not present

## 2022-05-10 DIAGNOSIS — R778 Other specified abnormalities of plasma proteins: Secondary | ICD-10-CM | POA: Insufficient documentation

## 2022-05-10 DIAGNOSIS — I509 Heart failure, unspecified: Secondary | ICD-10-CM | POA: Diagnosis not present

## 2022-05-10 LAB — BASIC METABOLIC PANEL
Anion gap: 11 (ref 5–15)
BUN: 15 mg/dL (ref 8–23)
CO2: 23 mmol/L (ref 22–32)
Calcium: 9.2 mg/dL (ref 8.9–10.3)
Chloride: 102 mmol/L (ref 98–111)
Creatinine, Ser: 1.17 mg/dL (ref 0.61–1.24)
GFR, Estimated: >60 mL/min (ref >60)
Glucose, Bld: 104 mg/dL — ABNORMAL HIGH (ref 70–99)
Potassium: 3.6 mmol/L (ref 3.5–5.1)
Sodium: 136 mmol/L (ref 135–145)

## 2022-05-10 LAB — CBC
HCT: 44.7 % (ref 39.0–52.0)
Hemoglobin: 15.2 g/dL (ref 13.0–17.0)
MCH: 30.7 pg (ref 26.0–34.0)
MCHC: 34 g/dL (ref 30.0–36.0)
MCV: 90.3 fL (ref 80.0–100.0)
Platelets: 253 10*3/uL (ref 150–400)
RBC: 4.95 MIL/uL (ref 4.22–5.81)
RDW: 15 % (ref 11.5–15.5)
WBC: 9.2 10*3/uL (ref 4.0–10.5)
nRBC: 0 % (ref 0.0–0.2)

## 2022-05-10 LAB — TROPONIN I (HIGH SENSITIVITY)
Troponin I (High Sensitivity): 16 ng/L (ref <18)
Troponin I (High Sensitivity): 18 ng/L — ABNORMAL HIGH (ref <18)

## 2022-05-10 MED ORDER — HYDRALAZINE HCL 10 MG PO TABS
10.0000 mg | ORAL_TABLET | Freq: Once | ORAL | Status: AC
  Administered 2022-05-10: 10 mg via ORAL
  Filled 2022-05-10: qty 1

## 2022-05-10 MED ORDER — ACETAMINOPHEN 500 MG PO TABS
1000.0000 mg | ORAL_TABLET | ORAL | Status: AC
  Administered 2022-05-10: 1000 mg via ORAL
  Filled 2022-05-10: qty 2

## 2022-05-10 NOTE — Discharge Instructions (Addendum)
Today you were seen in the emergency department for your headache and elevated blood.    In the emergency department you had a CT of your head which showed an empty sella which will need to follow-up with your primary doctor about.  Your troponin was mildly elevated which will need to talk to your primary doctor about but you are not having any chest pain, shortness of breath, dizziness, or symptoms of a heart attack.  Your EKG was also reassuring.  At home, please take Tylenol as needed for your headache.    Follow-up with your primary doctor in 2-3 days regarding your visit.   Return immediately to the emergency department if you experience any of the following: Worsening headache, chest pain, shortness of breath, or any other concerning symptoms.    Thank you for visiting our Emergency Department. It was a pleasure taking care of you today.

## 2022-05-10 NOTE — ED Triage Notes (Signed)
Pt endorses HTN for 3 days. Also has been having mid neck pain, headache and gas. Denies CP or SOB. States he can feel his heart beating. Pt has been taking HTN meds.

## 2022-05-10 NOTE — ED Provider Notes (Signed)
[facility] EMERGENCY DEPARTMENT Provider Note   CSN: [id number] Arrival date & time: 05/10/22  1443     History {Add pertinent medical, surgical, social history, OB history to HPI:1} Chief Complaint  Patient presents with   Hypertension    [name] is a 61 y.o. male.  61 year old male with a history of hyperlipidemia and hypertension on nebivolol who presents with hypertension and headache.  States that 3 days ago he had a headache that reached maximal intensity in approximately 2 hours.  Describes it as frontal and has difficulty characterizing the pain.  Says it been constant since then.  No exacerbating or alleviating factors including photo phonophobia.  Denies any fevers.  No nausea or vomiting, vision changes, numbness or weakness of his arms or legs.  Says that he was taking his blood pressure at home and it was 173 systolic so he was concerned so came in.  Does not typically get headaches.   Hypertension       Home Medications Prior to Admission medications   Medication Sig Start Date End Date Taking? Authorizing Provider  atorvastatin (LIPITOR) 40 MG tablet Take 1 tablet (40 mg total) by mouth daily. 10/10/21   Burnard Hawthorne B, PA-C  furosemide (LASIX) 80 MG tablet TAKE 1/2 TABLET BY MOUTH TWICE A DAY 04/23/22   Ronnald Nian, MD  KLOR-CON M20 20 MEQ tablet TAKE 1 TABLET BY MOUTH EVERY DAY 04/07/22   Tysinger, Kermit Balo, PA-C  nebivolol (BYSTOLIC) 10 MG tablet 1 daily 05/07/22   Tysinger, Kermit Balo, PA-C  omeprazole (PRILOSEC) 20 MG capsule TAKE 1 CAPSULE BY MOUTH EVERY DAY 05/07/22   Tysinger, Kermit Balo, PA-C      Allergies    Tomato    Review of Systems   Review of Systems  Physical Exam Updated Vital Signs BP (!) 173/97   Pulse 65   Temp 98.4 F (36.9 C) (Oral)   Resp (!) 21   Ht  (1.702 m)   Wt 136.1 kg   SpO2 96%   BMI 46.99 kg/m  Physical Exam Vitals and nursing note reviewed.  Constitutional:      General: He is not in  acute distress.    Appearance: He is well-developed.  HENT:     Head: Normocephalic and atraumatic.     Right Ear: External ear normal.     Left Ear: External ear normal.     Nose: Nose normal.  Eyes:     Extraocular Movements: Extraocular movements intact.     Conjunctiva/sclera: Conjunctivae normal.     Pupils: Pupils are equal, round, and reactive to light.  Neck:     Comments: No meningismus Cardiovascular:     Rate and Rhythm: Normal rate and regular rhythm.     Heart sounds: Normal heart sounds.  Pulmonary:     Effort: Pulmonary effort is normal. No respiratory distress.     Breath sounds: Normal breath sounds.  Abdominal:     General: There is no distension.     Palpations: Abdomen is soft. There is no mass.     Tenderness: There is no abdominal tenderness. There is no guarding.  Musculoskeletal:        General: No swelling.     Cervical back: Normal range of motion and neck supple.     Right lower leg: No edema.     Left lower leg: No edema.  Skin:    General: Skin is warm and dry.  Capillary Refill: Capillary refill takes less than 2 seconds.  Neurological:     Mental Status: He is alert.     Comments: MENTAL STATUS: AAOx3 CRANIAL NERVES: II: Pupils equal and reactive 3 mm BL, no RAPD, no VF deficits III, IV, VI: EOM intact, no gaze preference or deviation, no nystagmus. V: normal sensation to light touch in V1, V2, and V3 segments bilaterally VII: no facial weakness or asymmetry, no nasolabial fold flattening VIII: normal hearing to speech and finger friction IX, X: normal palatal elevation, no uvular deviation XI: 5/5 head turn and 5/5 shoulder shrug bilaterally XII: midline tongue protrusion MOTOR: 5/5 strength in R shoulder flexion, elbow flexion and extension, and grip strength. 5/5 strength in L shoulder flexion, elbow flexion and extension, and grip strength.  5/5 strength in R hip and knee flexion, knee extension, ankle plantar and dorsiflexion. 5/5  strength in L hip and knee flexion, knee extension, ankle plantar and dorsiflexion. SENSORY: Normal sensation to light touch in all extremities COORD: Normal finger to nose and heel to shin, no tremor, no dysmetria STATION: normal stance, no truncal ataxia GAIT: Normal   Psychiatric:        Mood and Affect: Mood normal.        Behavior: Behavior normal.     ED Results / Procedures / Treatments   Labs (all labs ordered are listed, but only abnormal results are displayed) Labs Reviewed  BASIC METABOLIC PANEL - Abnormal; Notable for the following components:      Result Value   Glucose, Bld 104 (*)    All other components within normal limits  CBC  TROPONIN I (HIGH SENSITIVITY)  TROPONIN I (HIGH SENSITIVITY)    EKG EKG Interpretation  Date/Time:  Saturday May 10 2022 14:54:42 EDT Ventricular Rate:  102 PR Interval:  174 QRS Duration: 90 QT Interval:  362 QTC Calculation: 471 R Axis:   -40 Text Interpretation: Sinus tachycardia Left axis deviation Inferior infarct , age undetermined Abnormal ECG When compared with ECG of 15-Apr-2013 13:39, No significant change since last tracing Confirmed by Vonita Moss [phone]) on 05/10/2022 5:00:05 PM  Radiology DG Chest 2 View  Result Date: 05/10/2022 CLINICAL DATA:  Hypertension EXAM: CHEST - 2 VIEW COMPARISON:  04/15/2013 FINDINGS: Cardiac size is within normal limits. Increase in the AP diameter of chest suggests COPD. Lung fields are clear of any infiltrates or pulmonary edema. There is no pleural effusion or pneumothorax. Degenerative changes are noted in thoracic spine. IMPRESSION: COPD. There are no signs of pulmonary edema or focal pulmonary consolidation. Electronically Signed   By: Ernie Avena M.D.   On: 05/10/2022 15:49    Procedures Procedures  {Document cardiac monitor, telemetry assessment procedure when appropriate:1}  Medications Ordered in ED Medications  acetaminophen (TYLENOL) tablet 1,000 mg (has no  administration in time range)  hydrALAZINE (APRESOLINE) tablet 10 mg (has no administration in time range)    ED Course/ Medical Decision Making/ A&P                           Medical Decision Making Amount and/or Complexity of Data Reviewed Labs: ordered. Radiology: ordered.  Risk OTC drugs. Prescription drug management.   DIONTAY ROSENCRANS is a 61 y.o. male with history of hypertension hyperlipidemia who presents with chief complaint of headache and elevated blood pressure.  Initial Ddx:  Migraine, hypertension, hypertensive emergency  MDM:  ***  Plan:  ***  ED Summary:  ***  Dispo: {Disposition:28069}   Additional history obtained from {Additional History:28067} Records reviewed {Records Reviewed:28068} The following labs were independently interpreted: {labs interpreted:28064} I independently visualized the following imaging with scope of interpretation limited to determining acute life threatening conditions related to emergency care: {imaging interpreted:28065}, which revealed {No acute abnormality:28066}  Consults: {Consultants:28063} Social Determinants of health:  ***  {Document critical care time when appropriate:1} {Document POCUS if Performed:1}   {Document critical care time when appropriate:1} {Document review of labs and clinical decision tools ie heart score, Chads2Vasc2 etc:1}  {Document your independent review of radiology images, and any outside records:1} {Document your discussion with family members, caretakers, and with consultants:1} {Document social determinants of health affecting pt's care:1} {Document your decision making why or why not admission, treatments were needed:1} Final Clinical Impression(s) / ED Diagnoses Final diagnoses:  None    Rx / DC Orders ED Discharge Orders     None

## 2022-05-12 NOTE — Progress Notes (Addendum)
Office Visit    Patient Name: [name] Date of Encounter: 05/15/2022  Primary Care Provider:  Jac Canavan, PA-C Primary Cardiologist:  Donato Schultz, MD Primary Electrophysiologist: None  Chief Complaint    MD SMOLA is a 61 y.o. male with PMH of HTN, HLD, GERD, OSA, asthma, family history of CAD, HFpEF who presents today for follow-up of hypertension.  Past Medical History    Past Medical History:  Diagnosis Date   Asthma    mild, no inhaler used   Bilateral renal cysts 04/25/2019   CHF (congestive heart failure) (HCC)    DJD (degenerative joint disease)    has taken tramadol 50mg  q8h in past   Elevated LDL cholesterol level 06/19/2019   Esophageal reflux 04/23/2016   GERD (gastroesophageal reflux disease)    Hyperlipidemia    Hypertension    Morbid obesity (HCC)    weighed in excess of 500 lbs at one point   OSA (obstructive sleep apnea)    did not tolerate CPAP, uses nasal cannula   Proteinuria    GFR 94.12 February 2015   Past Surgical History:  Procedure Laterality Date   COLONOSCOPY N/A 04/22/2013   Procedure: COLONOSCOPY;  Surgeon: 04/24/2013, MD;  Location: [facility];  Service: General;  Laterality: N/A;   leg surgery for fracture Right 2008 or 2009   SPHINCTEROTOMY Left 04/22/2013   Procedure: Exam under anesthesia, left lateral internal SPHINCTEROTOMY;  Surgeon: 04/24/2013, MD;  Location: [facility];  Service: General;  Laterality: Left;    Allergies  Allergies  Allergen Reactions   Tomato Rash    History of Present Illness    [name] is a 61 year old male with the above-mentioned past medical history who presents today for follow-up of hypertension.  [name] was initially seen by Dr. Linna Darner in 2019 for complaint of chest pain.  He has family history of early CAD and was sent for [facility].  Myoview resulted in low risk with no ST segment deviation and small defect of moderate severity in apex location.  This was followed up  with 2D echo that revealed EF of 65-70%, no RWMA, mild LVH, with normal valve structure.  He was last seen on 05/2020 for follow-up.  During visit patient no longer was having issues with chest pain.  He was placed on high intensity statin due to elevated LDL and family history.  He presented to the ED on 05/10/2022 with complaint of elevated blood pressures and headache.  Home systolic pressures were noted to be 2170s 180s.  CT of the head was obtained that showed no acute brain findings.  Troponins and EKG are both within normal limits.  Blood pressures improved with following dose of hydralazine and Tylenol.   [name] presents today for follow-up with his mother.  Since last being seen in the office patient reports his blood pressures have been elevated.  He was seen recently in the emergency room for complaint of headache and elevated blood pressures.  Blood pressure today was 186/110 and was 148/115 on recheck.  He states that he did not take his medications today.  I advised him to take his medications as soon as possible when he gets home and recheck his blood pressures.  He denied any headache or chest pain today during visit.  He does admit to sodium indiscretions and is currently not participating in exercise routine.  Patient denies chest pain, palpitations, dyspnea, PND, orthopnea, nausea, vomiting,  dizziness, syncope, edema, weight gain, or early satiety.  Home Medications    Current Outpatient Medications  Medication Sig Dispense Refill   atorvastatin (LIPITOR) 40 MG tablet Take 1 tablet (40 mg total) by mouth daily. 90 tablet 2   furosemide (LASIX) 80 MG tablet TAKE 1/2 TABLET BY MOUTH TWICE A DAY 90 tablet 0   KLOR-CON M20 20 MEQ tablet TAKE 1 TABLET BY MOUTH EVERY DAY 90 tablet 0   lisinopril (ZESTRIL) 20 MG tablet Take 1 tablet (20 mg total) by mouth daily. 90 tablet 3   omeprazole (PRILOSEC) 20 MG capsule TAKE 1 CAPSULE BY MOUTH EVERY DAY 90 capsule 0   nebivolol 20 MG TABS Take 1  tablet (20 mg total) by mouth daily. 1 daily 90 tablet 3   No current facility-administered medications for this visit.     Review of Systems  Please see the history of present illness.     All other systems reviewed and are otherwise negative except as noted above.  Physical Exam    Wt Readings from Last 3 Encounters:  05/15/22 (!) 339 lb 12.8 oz (154.1 kg)  05/13/22 (!) 341 lb 12.8 oz (155 kg)  05/10/22 300 lb (136.1 kg)   VS: Vitals:   05/15/22 1025  BP: (!) 186/110  Pulse: 74  SpO2: 97%  ,Body mass index is 53.22 kg/m.  Constitutional:      Appearance: Healthy appearance. Not in distress.  Neck:     Vascular: JVD normal.  Pulmonary:     Effort: Pulmonary effort is normal.     Breath sounds: No wheezing. No rales. Diminished in the bases Cardiovascular:     Normal rate. Regular rhythm. Normal S1. Normal S2.      Murmurs: There is no murmur.  Edema:    Trace peripheral edema present in bilateral lower extremity.  Abdominal:     Palpations: Abdomen is soft non tender. There is no hepatomegaly.  Skin:    General: Skin is warm and dry.  Neurological:     General: No focal deficit present.     Mental Status: Alert and oriented to person, place and time.     Cranial Nerves: Cranial nerves are intact.  EKG/LABS/Other Studies Reviewed    ECG personally reviewed by me today -sinus rhythm with left axis deviation with rate of 74 bpm and no acute changes compared to previous EKG.   Lab Results  Component Value Date   WBC 9.2 05/10/2022   HGB 15.2 05/10/2022   HCT 44.7 05/10/2022   MCV 90.3 05/10/2022   PLT 253 05/10/2022   Lab Results  Component Value Date   CREATININE 1.17 05/10/2022   BUN 15 05/10/2022   NA 136 05/10/2022   K 3.6 05/10/2022   CL 102 05/10/2022   CO2 23 05/10/2022   Lab Results  Component Value Date   ALT 13 10/10/2021   AST 21 10/10/2021   ALKPHOS 58 10/10/2021   BILITOT 0.5 10/10/2021   Lab Results  Component Value Date   CHOL  185 10/10/2021   HDL 64 10/10/2021   LDLCALC 105 (H) 10/10/2021   TRIG 91 10/10/2021   CHOLHDL 2.9 10/10/2021    Lab Results  Component Value Date   HGBA1C 5.7 (A) 05/13/2022    Assessment & Plan    1.  Atypical chest pain: -Today patient reports no chest pain or shortness of breath during today's visit.  2.  Essential hypertension: -Patient recently seen in the ED with elevated  blood pressures in the 170s and 180s systolically. -Today patient's blood pressures remain elevated at 175/117 initially and 148/115 on recheck. -We will increase patient's Bystolic to 20 mg daily -Add lisinopril 20 mg daily -Check BMET in 2 weeks -Patient instructed to check blood pressures and bring with return visit to ED -We will refer to hypertension clinic for further management with blood pressures  3.  Hyperlipidemia: -Continue Lipitor 40 mg daily  4.  Morbid obesity: -Patient's current BMI is 53.22 kg/m -We discussed the importance of regular physical activity and patient is currently motivated to start a walking program.  Disposition: Follow-up with Donato Schultz, MD or APP in 2 weeks    Medication Adjustments/Labs and Tests Ordered: Current medicines are reviewed at length with the patient today.  Concerns regarding medicines are outlined above.   Signed, Napoleon Form, Leodis Rains, NP 05/15/2022, 12:40 PM [facility]

## 2022-05-13 ENCOUNTER — Telehealth: Payer: Self-pay | Admitting: Medical

## 2022-05-13 ENCOUNTER — Ambulatory Visit (INDEPENDENT_AMBULATORY_CARE_PROVIDER_SITE_OTHER): Payer: Medicare HMO | Admitting: Medical

## 2022-05-13 ENCOUNTER — Encounter: Payer: Self-pay | Admitting: Medical

## 2022-05-13 VITALS — BP 140/90 | HR 74 | Wt 341.8 lb

## 2022-05-13 DIAGNOSIS — N281 Cyst of kidney, acquired: Secondary | ICD-10-CM

## 2022-05-13 DIAGNOSIS — R7301 Impaired fasting glucose: Secondary | ICD-10-CM

## 2022-05-13 DIAGNOSIS — I10 Essential (primary) hypertension: Secondary | ICD-10-CM

## 2022-05-13 DIAGNOSIS — G4733 Obstructive sleep apnea (adult) (pediatric): Secondary | ICD-10-CM

## 2022-05-13 DIAGNOSIS — E782 Mixed hyperlipidemia: Secondary | ICD-10-CM | POA: Diagnosis not present

## 2022-05-13 LAB — POCT GLYCOSYLATED HEMOGLOBIN (HGB A1C): Hemoglobin A1C: 5.7 % — AB (ref 4.0–5.6)

## 2022-05-13 NOTE — Telephone Encounter (Signed)
Transition Care Management Follow-up Telephone Call Date of discharge and from where: 05/10/2022 [facility] How have you been since you were released from the hospital? Better Any questions or concerns? No  Items Reviewed: Did the pt receive and understand the discharge instructions provided? Yes  Medications obtained and verified? Yes  Other? No  Any new allergies since your discharge? No  Dietary orders reviewed? No Do you have support at home? Yes   Home Care and Equipment/Supplies: Were home health services ordered? no  Follow up appointments reviewed:  PCP Hospital f/u appt confirmed? Yes  Scheduled to see Tysinger  on 05/13/2022 @ 4:00. Specialist Hospital f/u appt confirmed? Yes  Scheduled to see Dr. Napoleon Form.  on 05/15/2022 @ 10:30. Are transportation arrangements needed? No  If their condition worsens, is the pt aware to call PCP or go to the Emergency Dept.? Yes Was the patient provided with contact information for the PCP's office or ED? Yes Was to pt encouraged to call back with questions or concerns? Yes

## 2022-05-13 NOTE — Progress Notes (Unsigned)
Subjective:  [name] is a 61 y.o. male who presents for Chief Complaint  Patient presents with   ER Follow-up    ER follow-up for BP, declines flu shot today     Medical team: Dr. Donato Schultz, cardiology No kidney specialist Was seeing PCP here but both providers left   Concerns: Here for emergency dep f/u.  Was seen 05/10/22 in emergency dept at [facility].  Went in for high blood pressure.  Was seeing 180s/150s at home, so went to ED.  Was feeling lightheaded and weak but no chest pains, no edema, but had some occasional palpitations.   He notes at the hospital, no medications were changed. ***  Sees cardiology this week   Exercise - mowing yards, otherwise no.  Avoids salt.   Eating habits - tries to eat low fat.    Weight watchers program  A1C    No other aggravating or relieving factors.    No other c/o.  Past Medical History:  Diagnosis Date   Asthma    mild, no inhaler used   Bilateral renal cysts 04/25/2019   CHF (congestive heart failure) (HCC)    DJD (degenerative joint disease)    has taken tramadol 50mg  q8h in past   Elevated LDL cholesterol level 06/19/2019   Esophageal reflux 04/23/2016   GERD (gastroesophageal reflux disease)    Hyperlipidemia    Hypertension    Morbid obesity (HCC)    weighed in excess of 500 lbs at one point   OSA (obstructive sleep apnea)    did not tolerate CPAP, uses nasal cannula   Proteinuria    GFR 94.12 February 2015   Current Outpatient Medications on File Prior to Visit  Medication Sig Dispense Refill   atorvastatin (LIPITOR) 40 MG tablet Take 1 tablet (40 mg total) by mouth daily. 90 tablet 2   furosemide (LASIX) 80 MG tablet TAKE 1/2 TABLET BY MOUTH TWICE A DAY 90 tablet 0   KLOR-CON M20 20 MEQ tablet TAKE 1 TABLET BY MOUTH EVERY DAY 90 tablet 0   nebivolol (BYSTOLIC) 10 MG tablet 1 daily 90 tablet 0   omeprazole (PRILOSEC) 20 MG capsule TAKE 1 CAPSULE BY MOUTH EVERY DAY 90 capsule 0   No current  facility-administered medications on file prior to visit.     The following portions of the patient's history were reviewed and updated as appropriate: allergies, current medications, past family history, past medical history, past social history, past surgical history and problem list.  ROS Otherwise as in subjective above  Objective: BP (!) 140/90   Pulse 74   Wt (!) 341 lb 12.8 oz (155 kg)   SpO2 97%   BMI 53.53 kg/m   General appearance: alert, no distress, well developed, well nourished Neck: supple, no lymphadenopathy, no thyromegaly, no masses Heart: RRR, normal S1, S2, no murmurs Lungs: CTA bilaterally, no wheezes, rhonchi, or rales Pulses: 2+ radial pulses, 2+ pedal pulses, normal cap refill Ext: no edema   Assessment: Encounter Diagnoses  Name Primary?   Primary hypertension Yes   OSA (obstructive sleep apnea)    Morbid obesity (HCC)    Mixed hyperlipidemia    Bilateral renal cysts    Impaired fasting blood sugar      Plan: High blood pressure I reviewed your recent emergency department notes Take your blood pressure cuff with you Thursday to the heart doctor to compare readings Continue your current medications Limit salt  Work on losing weight  Obesity Work on  losing weight through healthy eating habits and exercise Try to walk daily for exercise Begin Weight Watchers program Limit salt Consider nutritionist counseling appointment   OSA Declines CPAP as he sleeps prone and didn't tolerate mask prior Work on losing weight  High cholesterol Continue your current medication  Renal cysts  You had prior cysts in your kidneys, last ultrasounds several years ago I recommend updating an ultrasound    [name] was seen today for er follow-up.  Diagnoses and all orders for this visit:  Primary hypertension  OSA (obstructive sleep apnea)  Morbid obesity (HCC)  Mixed hyperlipidemia  Bilateral renal cysts  Impaired fasting blood  sugar    Follow up: see cardiology this week as planned

## 2022-05-13 NOTE — Patient Instructions (Signed)
High blood pressure I reviewed your recent emergency department notes Take your blood pressure cuff with you Thursday to the heart doctor to compare readings Continue your current medications Limit salt  Work on losing weight  Obesity Work on losing weight through healthy eating habits and exercise Try to walk daily for exercise Begin Weight Watchers program Limit salt Consider nutritioinist counseling appointment   OSA Declines CPAP as he sleeps prone and didn't tolerate mask prior Work on losing weight  High cholesterol Continue your current medication  Renal cysts  You had prior cysts in your kidneys, last ultrasounds several years ago I recommend updating an ultrasound

## 2022-05-15 ENCOUNTER — Ambulatory Visit: Payer: Medicare HMO | Attending: Nurse Practitioner | Admitting: Nurse Practitioner

## 2022-05-15 ENCOUNTER — Encounter: Payer: Self-pay | Admitting: Nurse Practitioner

## 2022-05-15 VITALS — BP 148/115 | HR 74 | Ht 67.0 in | Wt 339.8 lb

## 2022-05-15 DIAGNOSIS — Z79899 Other long term (current) drug therapy: Secondary | ICD-10-CM | POA: Diagnosis not present

## 2022-05-15 DIAGNOSIS — R0789 Other chest pain: Secondary | ICD-10-CM

## 2022-05-15 DIAGNOSIS — E782 Mixed hyperlipidemia: Secondary | ICD-10-CM | POA: Diagnosis not present

## 2022-05-15 DIAGNOSIS — I10 Essential (primary) hypertension: Secondary | ICD-10-CM | POA: Diagnosis not present

## 2022-05-15 MED ORDER — LISINOPRIL 20 MG PO TABS: 20.0000 mg | ORAL_TABLET | Freq: Every day | ORAL | 3 refills | Status: DC

## 2022-05-15 MED ORDER — NEBIVOLOL HCL 20 MG PO TABS: 20.0000 mg | ORAL_TABLET | Freq: Every day | ORAL | 3 refills | Status: DC

## 2022-05-15 NOTE — Patient Instructions (Signed)
Medication Instructions:   INCREASE Bystolic one (1) tablet by mouth (20 mg ) daily. You can take two (2) of your (10 mg) tablets at the same time and use them up.  START Lisinopril one (1) tablet by mouth ( 20 mg) daily.   *If you need a refill on your cardiac medications before your next appointment, please call your pharmacy*   Lab Work:  None ordered.  If you have labs (blood work) drawn today and your tests are completely normal, you will receive your results only by: MyChart Message (if you have MyChart) OR A paper copy in the mail If you have any lab test that is abnormal or we need to change your treatment, we will call you to review the results.   Testing/Procedures:  None ordered.   Follow-Up: At [facility], you and your health needs are our priority.  As part of our continuing mission to provide you with exceptional heart care, we have created designated Provider Care Teams.  These Care Teams include your primary Cardiologist (physician) and Advanced Practice Providers (APPs -  Physician Assistants and Nurse Practitioners) who all work together to provide you with the care you need, when you need it.  We recommend signing up for the patient portal called "MyChart".  Sign up information is provided on this After Visit Summary.  MyChart is used to connect with patients for Virtual Visits (Telemedicine).  Patients are able to view lab/test results, encounter notes, upcoming appointments, etc.  Non-urgent messages can be sent to your provider as well.   To learn more about what you can do with MyChart, go to [email or web address].    Your next appointment:   2 week(s)  The format for your next appointment:   In Person  Provider:   Robin Searing, NP         Other Instructions  You have been referred to the Pharmacist for Hypertension/High Blood Pressure. Please Keep Appointment in October.    Important Information About Sugar

## 2022-05-19 ENCOUNTER — Telehealth: Payer: Self-pay | Admitting: Cardiology

## 2022-05-19 NOTE — Telephone Encounter (Signed)
Spoke with pt regarding his blood pressures.  Pt reports he did increase the Lisinopril to 2 tablets daily but just picked up the nebivolol 20 mg a few minutes ago.  He has not started this medication at this time. Advised to go ahead and take the medication now as ordered to help get his blood pressure under control.  He states understanding. He will continue to monitor his blood pressures at home and call if no improvement.

## 2022-05-19 NOTE — Telephone Encounter (Signed)
Pt c/o BP issue: STAT if pt c/o blurred vision, one-sided weakness or slurred speech  1. What are your last 5 BP readings?  191/111 - 5 minutes ago, already taken medication today    2. Are you having any other symptoms (ex. Dizziness, headache, blurred vision, passed out)? No  3. What is your BP issue? Pt states he had to call EMS last Saturday because his BP was high and he was dizzy. He states he took his bp before calling and it was 191/111.

## 2022-05-26 NOTE — Progress Notes (Unsigned)
Office Visit    Patient Name: [name] Date of Encounter: 05/26/2022  Primary Care Provider:  Carlena Hurl, PA-C Primary Cardiologist:  Candee Furbish, MD Primary Electrophysiologist: None  Chief Complaint   [name] is a 61 y.o. male with PMH of HTN, HLD, GERD, OSA, asthma, family history of CAD, HFpEF who presents today for follow-up of hypertension.   Past Medical History    Past Medical History:  Diagnosis Date   Asthma    mild, no inhaler used   Bilateral renal cysts 04/25/2019   CHF (congestive heart failure) (HCC)    DJD (degenerative joint disease)    has taken tramadol 50mg  q8h in past   Elevated LDL cholesterol level 06/19/2019   Esophageal reflux 04/23/2016   GERD (gastroesophageal reflux disease)    Hyperlipidemia    Hypertension    Morbid obesity ([place])    weighed in excess of 500 lbs at one point   OSA (obstructive sleep apnea)    did not tolerate CPAP, uses nasal cannula   Proteinuria    GFR 94.12 February 2015   Past Surgical History:  Procedure Laterality Date   COLONOSCOPY N/A 04/22/2013   Procedure: COLONOSCOPY;  Surgeon: Shann Medal, MD;  Location: [facility];  Service: General;  Laterality: N/A;   leg surgery for fracture Right 2008 or 2009   SPHINCTEROTOMY Left 04/22/2013   Procedure: Exam under anesthesia, left lateral internal SPHINCTEROTOMY;  Surgeon: Shann Medal, MD;  Location: [facility];  Service: General;  Laterality: Left;    Allergies  Allergies  Allergen Reactions   Tomato Rash    History of Present Illness   [name] is a 60 year old male with the above-mentioned past medical history who presents today for follow-up of hypertension.  [name] was initially seen by Dr. Marlou Porch in 2019 for complaint of chest pain.  He has family history of early CAD and was sent for [facility].  Myoview resulted in low risk with no ST segment deviation and small defect of moderate severity in apex location.  This was followed up  with 2D echo that revealed EF of 65-70%, no RWMA, mild LVH, with normal valve structure.   He was last seen on 05/2020 for follow-up.  During visit patient no longer was having issues with chest pain.  He was placed on high intensity statin due to elevated LDL and family history.  He presented to the ED on 05/10/2022 with complaint of elevated blood pressures and headache.  Home systolic pressures were noted to be 2170s 180s.  CT of the head was obtained that showed no acute brain findings.  Troponins and EKG are both within normal limits.  Blood pressures improved with following dose of hydralazine and Tylenol.  Patient was seen during follow-up with blood pressures remaining elevated.  She stated that he had not taken his medications for the day.  Patient advised to take medication as soon as he returns back home.  Bystolic was increased to 20 mg and lisinopril 20 mg was added to current regimen.  Patient was also referred to hypertension clinic for assistance with hypertension management.    Since last being seen in the office patient reports***.  Patient denies chest pain, palpitations, dyspnea, PND, orthopnea, nausea, vomiting, dizziness, syncope, edema, weight gain, or early satiety.     ***Notes:  Home Medications    Current Outpatient Medications  Medication Sig Dispense Refill   atorvastatin (LIPITOR) 40 MG tablet Take  1 tablet (40 mg total) by mouth daily. 90 tablet 2   furosemide (LASIX) 80 MG tablet TAKE 1/2 TABLET BY MOUTH TWICE A DAY 90 tablet 0   KLOR-CON M20 20 MEQ tablet TAKE 1 TABLET BY MOUTH EVERY DAY 90 tablet 0   lisinopril (ZESTRIL) 20 MG tablet Take 1 tablet (20 mg total) by mouth daily. 90 tablet 3   nebivolol 20 MG TABS Take 1 tablet (20 mg total) by mouth daily. 1 daily 90 tablet 3   omeprazole (PRILOSEC) 20 MG capsule TAKE 1 CAPSULE BY MOUTH EVERY DAY 90 capsule 0   No current facility-administered medications for this visit.     Review of Systems  Please see the  history of present illness.    (+)*** (+)***  All other systems reviewed and are otherwise negative except as noted above.  Physical Exam    Wt Readings from Last 3 Encounters:  05/15/22 (!) 339 lb 12.8 oz (154.1 kg)  05/13/22 (!) 341 lb 12.8 oz (155 kg)  05/10/22 300 lb (136.1 kg)   KJ:ZPHXT were no vitals filed for this visit.,There is no height or weight on file to calculate BMI.  Constitutional:      Appearance: Healthy appearance. Not in distress.  Neck:     Vascular: JVD normal.  Pulmonary:     Effort: Pulmonary effort is normal.     Breath sounds: No wheezing. No rales. Diminished in the bases Cardiovascular:     Normal rate. Regular rhythm. Normal S1. Normal S2.      Murmurs: There is no murmur.  Edema:    Peripheral edema absent.  Abdominal:     Palpations: Abdomen is soft non tender. There is no hepatomegaly.  Skin:    General: Skin is warm and dry.  Neurological:     General: No focal deficit present.     Mental Status: Alert and oriented to person, place and time.     Cranial Nerves: Cranial nerves are intact.  EKG/LABS/Other Studies Reviewed    ECG personally reviewed by me today - ***  Risk Assessment/Calculations:   {Does this patient have ATRIAL FIBRILLATION?[phone]}        Lab Results  Component Value Date   WBC 9.2 05/10/2022   HGB 15.2 05/10/2022   HCT 44.7 05/10/2022   MCV 90.3 05/10/2022   PLT 253 05/10/2022   Lab Results  Component Value Date   CREATININE 1.17 05/10/2022   BUN 15 05/10/2022   NA 136 05/10/2022   K 3.6 05/10/2022   CL 102 05/10/2022   CO2 23 05/10/2022   Lab Results  Component Value Date   ALT 13 10/10/2021   AST 21 10/10/2021   ALKPHOS 58 10/10/2021   BILITOT 0.5 10/10/2021   Lab Results  Component Value Date   CHOL 185 10/10/2021   HDL 64 10/10/2021   LDLCALC 105 (H) 10/10/2021   TRIG 91 10/10/2021   CHOLHDL 2.9 10/10/2021    Lab Results  Component Value Date   HGBA1C 5.7 (A) 05/13/2022     Assessment & Plan    1.  Essential hypertension: -Patient referral to hypertension clinic for blood pressure management. -Blood pressure today was*** -Continue Zestril 20 mg and Bystolic 20 mg -We will check BMET today  2.  Atypical chest pain: -Today patient reports***  3.  Hyperlipidemia: --Continue Lipitor 40 mg daily  4.  Morbid obesity -Patient's current BMI is 53.22 kg/m -We discussed the importance of regular physical activity and patient is  currently motivated to start a walking program.   Disposition: Follow-up with Donato Schultz, MD or APP in *** months {Are you ordering a CV Procedure (e.g. stress test, cath, DCCV, TEE, etc)?   Press F2        :[id number]}   Medication Adjustments/Labs and Tests Ordered: Current medicines are reviewed at length with the patient today.  Concerns regarding medicines are outlined above.   Signed, Napoleon Form, Leodis Rains, NP 05/26/2022, 7:38 AM [facility]

## 2022-05-28 ENCOUNTER — Encounter: Payer: Self-pay | Admitting: Nurse Practitioner

## 2022-05-28 ENCOUNTER — Ambulatory Visit: Payer: Medicare HMO | Attending: Nurse Practitioner | Admitting: Nurse Practitioner

## 2022-05-28 VITALS — BP 140/96 | HR 78 | Ht 67.0 in | Wt 341.2 lb

## 2022-05-28 DIAGNOSIS — E7800 Pure hypercholesterolemia, unspecified: Secondary | ICD-10-CM

## 2022-05-28 DIAGNOSIS — R0789 Other chest pain: Secondary | ICD-10-CM | POA: Diagnosis not present

## 2022-05-28 DIAGNOSIS — I10 Essential (primary) hypertension: Secondary | ICD-10-CM

## 2022-05-28 DIAGNOSIS — R002 Palpitations: Secondary | ICD-10-CM | POA: Diagnosis not present

## 2022-05-28 LAB — BASIC METABOLIC PANEL
BUN/Creatinine Ratio: 13 (ref 10–24)
BUN: 15 mg/dL (ref 8–27)
CO2: 23 mmol/L (ref 20–29)
Calcium: 10.1 mg/dL (ref 8.6–10.2)
Chloride: 101 mmol/L (ref 96–106)
Creatinine, Ser: 1.18 mg/dL (ref 0.76–1.27)
Glucose: 102 mg/dL — ABNORMAL HIGH (ref 70–99)
Potassium: 4 mmol/L (ref 3.5–5.2)
Sodium: 137 mmol/L (ref 134–144)
eGFR: 70 mL/min/{1.73_m2} (ref >59)

## 2022-05-28 MED ORDER — AMLODIPINE BESYLATE 2.5 MG PO TABS: 2.5000 mg | ORAL_TABLET | Freq: Every day | ORAL | 3 refills | Status: DC

## 2022-05-28 MED ORDER — CARVEDILOL 3.125 MG PO TABS: 3.1250 mg | ORAL_TABLET | Freq: Two times a day (BID) | ORAL | 3 refills | Status: DC

## 2022-05-28 NOTE — Patient Instructions (Signed)
Medication Instructions:  Your physician has recommended you make the following change in your medication:  Stop taking Bystolic/ nobivolol Start taking Coreg/carvedilol 3.125mg  2 times daily Start taking Norvasc/amlodipine 2.5mg  daily  *If you need a refill on your cardiac medications before your next appointment, please call your pharmacy*   Lab Work: Bmet If you have labs (blood work) drawn today and your tests are completely normal, you will receive your results only by: [place] (if you have MyChart) OR A paper copy in the mail If you have any lab test that is abnormal or we need to change your treatment, we will call you to review the results.  Follow-Up: At [facility], you and your health needs are our priority.  As part of our continuing mission to provide you with exceptional heart care, we have created designated Provider Care Teams.  These Care Teams include your primary Cardiologist (physician) and Advanced Practice Providers (APPs -  Physician Assistants and Nurse Practitioners) who all work together to provide you with the care you need, when you need it.  We recommend signing up for the patient portal called "MyChart".  Sign up information is provided on this After Visit Summary.  MyChart is used to connect with patients for Virtual Visits (Telemedicine).  Patients are able to view lab/test results, encounter notes, upcoming appointments, etc.  Non-urgent messages can be sent to your provider as well.   To learn more about what you can do with MyChart, go to [email or web address].    Your next appointment:   3 months  The format for your next appointment:   In Person  Provider:   Ambrose Pancoast, NP        Other Instructions Two Gram Sodium Diet 2000 mg  What is Sodium? Sodium is a mineral found naturally in many foods. The most significant source of sodium in the diet is table salt, which is about 40% sodium.  Processed, convenience, and preserved  foods also contain a large amount of sodium.  The body needs only 500 mg of sodium daily to function,  A normal diet provides more than enough sodium even if you do not use salt.  Why Limit Sodium? A build up of sodium in the body can cause thirst, increased blood pressure, shortness of breath, and water retention.  Decreasing sodium in the diet can reduce edema and risk of heart attack or stroke associated with high blood pressure.  Keep in mind that there are many other factors involved in these health problems.  Heredity, obesity, lack of exercise, cigarette smoking, stress and what you eat all play a role.  General Guidelines: Do not add salt at the table or in cooking.  One teaspoon of salt contains over 2 grams of sodium. Read food labels Avoid processed and convenience foods Ask your dietitian before eating any foods not dicussed in the menu planning guidelines Consult your physician if you wish to use a salt substitute or a sodium containing medication such as antacids.  Limit milk and milk products to 16 oz (2 cups) per day.  Shopping Hints: READ LABELS!! "Dietetic" does not necessarily mean low sodium. Salt and other sodium ingredients are often added to foods during processing.    Menu Planning Guidelines Food Group Choose More Often Avoid  Beverages (see also the milk group All fruit juices, low-sodium, salt-free vegetables juices, low-sodium carbonated beverages Regular vegetable or tomato juices, commercially softened water used for drinking or cooking  Breads and Cereals Enriched white,  wheat, rye and pumpernickel bread, hard rolls and dinner rolls; muffins, cornbread and waffles; most dry cereals, cooked cereal without added salt; unsalted crackers and breadsticks; low sodium or homemade bread crumbs Bread, rolls and crackers with salted tops; quick breads; instant hot cereals; pancakes; commercial bread stuffing; self-rising flower and biscuit mixes; regular bread crumbs or  cracker crumbs  Desserts and Sweets Desserts and sweets mad with mild should be within allowance Instant pudding mixes and cake mixes  Fats Butter or margarine; vegetable oils; unsalted salad dressings, regular salad dressings limited to 1 Tbs; light, sour and heavy cream Regular salad dressings containing bacon fat, bacon bits, and salt pork; snack dips made with instant soup mixes or processed cheese; salted nuts  Fruits Most fresh, frozen and canned fruits Fruits processed with salt or sodium-containing ingredient (some dried fruits are processed with sodium sulfites        Vegetables Fresh, frozen vegetables and low- sodium canned vegetables Regular canned vegetables, sauerkraut, pickled vegetables, and others prepared in brine; frozen vegetables in sauces; vegetables seasoned with ham, bacon or salt pork  Condiments, Sauces, Miscellaneous  Salt substitute with physician's approval; pepper, herbs, spices; vinegar, lemon or lime juice; hot pepper sauce; garlic powder, onion powder, low sodium soy sauce (1 Tbs.); low sodium condiments (ketchup, chili sauce, mustard) in limited amounts (1 tsp.) fresh ground horseradish; unsalted tortilla chips, pretzels, potato chips, popcorn, salsa (1/4 cup) Any seasoning made with salt including garlic salt, celery salt, onion salt, and seasoned salt; sea salt, rock salt, kosher salt; meat tenderizers; monosodium glutamate; mustard, regular soy sauce, barbecue, sauce, chili sauce, teriyaki sauce, steak sauce, Worcestershire sauce, and most flavored vinegars; canned gravy and mixes; regular condiments; salted snack foods, olives, picles, relish, horseradish sauce, catsup   Food preparation: Try these seasonings Meats:    Pork Sage, onion Serve with applesauce  Chicken Poultry seasoning, thyme, parsley Serve with cranberry sauce  Lamb Curry powder, rosemary, garlic, thyme Serve with mint sauce or jelly  Veal Marjoram, basil Serve with current jelly, cranberry  sauce  Beef Pepper, bay leaf Serve with dry mustard, unsalted chive butter  Fish [place] leaf, dill Serve with unsalted lemon butter, unsalted parsley butter  Vegetables:    Asparagus Lemon juice   Broccoli Lemon juice   Carrots Mustard dressing parsley, mint, nutmeg, glazed with unsalted butter and sugar   Green beans Marjoram, lemon juice, nutmeg,dill seed   Tomatoes Basil, marjoram, onion   Spice /blend for [employer]" 4 tsp ground thyme 1 tsp ground sage 3 tsp ground rosemary 4 tsp ground marjoram   Test your knowledge A product that says "Salt Free" may still contain sodium. True or False Garlic Powder and Hot Pepper Sauce an be used as alternative seasonings.True or False Processed foods have more sodium than fresh foods.  True or False Canned Vegetables have less sodium than froze True or False   WAYS TO DECREASE YOUR SODIUM INTAKE Avoid the use of added salt in cooking and at the table.  Table salt (and other prepared seasonings which contain salt) is probably one of the greatest sources of sodium in the diet.  Unsalted foods can gain flavor from the sweet, sour, and butter taste sensations of herbs and spices.  Instead of using salt for seasoning, try the following seasonings with the foods listed.  Remember: how you use them to enhance natural food flavors is limited only by your creativity... Allspice-Meat, fish, eggs, fruit, peas, red and yellow vegetables Almond Extract-Fruit baked  goods Anise Seed-Sweet breads, fruit, carrots, beets, cottage cheese, cookies (tastes like licorice) Basil-Meat, fish, eggs, vegetables, rice, vegetables salads, soups, sauces Bay Leaf-Meat, fish, stews, poultry Burnet-Salad, vegetables (cucumber-like flavor) Caraway Seed-Bread, cookies, cottage cheese, meat, vegetables, cheese, rice Cardamon-Baked goods, fruit, soups Celery Powder or seed-Salads, salad dressings, sauces, meatloaf, soup, bread.Do not use  celery salt Chervil-Meats, salads, fish,  eggs, vegetables, cottage cheese (parsley-like flavor) Chili Power-Meatloaf, chicken cheese, corn, eggplant, egg dishes Chives-Salads cottage cheese, egg dishes, soups, vegetables, sauces Cilantro-Salsa, casseroles Cinnamon-Baked goods, fruit, pork, lamb, chicken, carrots Cloves-Fruit, baked goods, fish, pot roast, green beans, beets, carrots Coriander-Pastry, cookies, meat, salads, cheese (lemon-orange flavor) Cumin-Meatloaf, fish,cheese, eggs, cabbage,fruit pie (caraway flavor) [employer], fruit, eggs, fish, poultry, cottage cheese, vegetables Dill Seed-Meat, cottage cheese, poultry, vegetables, fish, salads, bread Fennel Seed-Bread, cookies, apples, pork, eggs, fish, beets, cabbage, cheese, Licorice-like flavor Garlic-(buds or powder) Salads, meat, poultry, fish, bread, butter, vegetables, potatoes.Do not  use garlic salt Ginger-Fruit, vegetables, baked goods, meat, fish, poultry Horseradish Root-Meet, vegetables, butter Lemon Juice or Extract-Vegetables, fruit, tea, baked goods, fish salads Mace-Baked goods fruit, vegetables, fish, poultry (taste like nutmeg) Maple Extract-Syrups Marjoram-Meat, chicken, fish, vegetables, breads, green salads (taste like Sage) Mint-Tea, lamb, sherbet, vegetables, desserts, carrots, cabbage Mustard, Dry or Seed-Cheese, eggs, meats, vegetables, poultry Nutmeg-Baked goods, fruit, chicken, eggs, vegetables, desserts Onion Powder-Meat, fish, poultry, vegetables, cheese, eggs, bread, rice salads (Do not use   Onion salt) Orange Extract-Desserts, baked goods Oregano-Pasta, eggs, cheese, onions, pork, lamb, fish, chicken, vegetables, green salads Paprika-Meat, fish, poultry, eggs, cheese, vegetables Parsley Flakes-Butter, vegetables, meat fish, poultry, eggs, bread, salads (certain forms may   Contain sodium Pepper-Meat fish, poultry, vegetables, eggs Peppermint Extract-Desserts, baked goods Poppy Seed-Eggs, bread, cheese, fruit dressings, baked  goods, noodles, vegetables, cottage  [employer], poultry, meat, fish, cauliflower, turnips,eggs bread Saffron-Rice, bread, veal, chicken, fish, eggs Sage-Meat, fish, poultry, onions, eggplant, tomateos, pork, stews Savory-Eggs, salads, poultry, meat, rice, vegetables, soups, pork Tarragon-Meat, poultry, fish, eggs, butter, vegetables (licorice-like flavor)  Thyme-Meat, poultry, fish, eggs, vegetables, (clover-like flavor), sauces, soups Tumeric-Salads, butter, eggs, fish, rice, vegetables (saffron-like flavor) Vanilla Extract-Baked goods, candy Vinegar-Salads, vegetables, meat marinades Walnut Extract-baked goods, candy   2. Choose your Foods Wisely   The following is a list of foods to avoid which are high in sodium:  Meats-Avoid all smoked, canned, salt cured, dried and kosher meat and fish as well as Anchovies   [employer] meats:Bologna, Liverwurst, Pastrami Canned meat or fish  Marinated herring Caviar    Pepperoni Corned Beef   Pizza Dried chipped beef  Salami Frozen breaded fish or meat Salt pork Frankfurters or hot dogs  Sardines Gefilte fish   Sausage Ham (boiled ham, Proscuitto Smoked butt    spiced ham)   Spam      TV Dinners Vegetables Canned vegetables (Regular) Relish Canned mushrooms  Sauerkraut Olives    Tomato juice Pickles  Bakery and Dessert Products Canned puddings  Cream pies Cheesecake   Decorated cakes Cookies  Beverages/Juices Tomato juice, regular  Gatorade   V-8 vegetable juice, regular  Breads and Cereals Biscuit mixes   Salted potato chips, corn chips, pretzels Bread stuffing mixes  Salted crackers and rolls Pancake and waffle mixes Self-rising flour  Seasonings Accent    Meat sauces Barbecue sauce  Meat tenderizer Catsup    Monosodium glutamate (MSG) Celery salt   Onion salt Chili sauce   Prepared mustard Garlic salt   Salt, seasoned salt, sea salt  Gravy mixes   Soy  sauce Horseradish   Steak sauce Ketchup   Tartar sauce Lite salt    Teriyaki sauce Marinade mixes   Worcestershire sauce  Others Baking powder   Cocoa and cocoa mixes Baking soda   Commercial casserole mixes Candy-caramels, chocolate  Dehydrated soups    Bars, fudge,nougats  Instant rice and pasta mixes Canned broth or soup  Maraschino cherries Cheese, aged and processed cheese and cheese spreads  Learning Assessment Quiz  Indicated T (for True) or F (for False) for each of the following statements:  _____ Fresh fruits and vegetables and unprocessed grains are generally low in sodium _____ Water may contain a considerable amount of sodium, depending on the source _____ You can always tell if a food is high in sodium by tasting it _____ Certain laxatives my be high in sodium and should be avoided unless prescribed   by a physician or pharmacist _____ Salt substitutes may be used freely by anyone on a sodium restricted diet _____ Sodium is present in table salt, food additives and as a natural component of   most foods _____ Table salt is approximately 90% sodium _____ Limiting sodium intake may help prevent excess fluid accumulation in the body _____ On a sodium-restricted diet, seasonings such as bouillon soy sauce, and    cooking wine should be used in place of table salt _____ On an ingredient list, a product which lists monosodium glutamate as the first   ingredient is an appropriate food to include on a low sodium diet  Circle the best answer(s) to the following statements (Hint: there may be more than one correct answer)  11. On a low-sodium diet, some acceptable snack items are:    A. Olives  F. Bean dip   K. Grapefruit juice    B. Salted Pretzels G. Commercial Popcorn   L. Canned peaches    C. Carrot Sticks  H. Bouillon   M. Unsalted nuts   D. [place] fries  I. Peanut butter crackers N. Salami   E. Sweet pickles J. Tomato Juice   O. Pizza  12.  Seasonings that may be  used freely on a reduced - sodium diet include   A. Lemon wedges F.Monosodium glutamate K. Celery seed    B.Soysauce   G. Pepper   L. Mustard powder   C. Sea salt  H. Cooking wine  M. Onion flakes   D. Vinegar  E. Prepared horseradish N. Salsa   E. Sage   J. Worcestershire sauce  O. Chutney

## 2022-06-16 ENCOUNTER — Ambulatory Visit (INDEPENDENT_AMBULATORY_CARE_PROVIDER_SITE_OTHER): Payer: Medicare HMO | Admitting: Medical

## 2022-06-16 VITALS — BP 194/130 | HR 91 | Wt 339.6 lb

## 2022-06-16 DIAGNOSIS — E782 Mixed hyperlipidemia: Secondary | ICD-10-CM | POA: Diagnosis not present

## 2022-06-16 DIAGNOSIS — N281 Cyst of kidney, acquired: Secondary | ICD-10-CM

## 2022-06-16 DIAGNOSIS — G4733 Obstructive sleep apnea (adult) (pediatric): Secondary | ICD-10-CM

## 2022-06-16 DIAGNOSIS — I10 Essential (primary) hypertension: Secondary | ICD-10-CM

## 2022-06-16 DIAGNOSIS — I160 Hypertensive urgency: Secondary | ICD-10-CM

## 2022-06-16 MED ORDER — AMLODIPINE BESYLATE 10 MG PO TABS: 10.0000 mg | ORAL_TABLET | Freq: Every day | ORAL | 2 refills | Status: DC

## 2022-06-16 MED ORDER — CARVEDILOL 12.5 MG PO TABS: 12.5000 mg | ORAL_TABLET | Freq: Two times a day (BID) | ORAL | 2 refills | Status: DC

## 2022-06-16 MED ORDER — IRBESARTAN 300 MG PO TABS: 300.0000 mg | ORAL_TABLET | Freq: Every day | ORAL | 2 refills | Status: DC

## 2022-06-16 NOTE — Progress Notes (Signed)
Subjective:  [name] is a 61 y.o. male who presents for Chief Complaint  Patient presents with   1 month follow-up    1 month follow-up, declines flu shot      Here for follow-up.  I saw him about a month ago for med check.  He was formally seeing other providers here that have now left our office.  Since I saw him he has seen cardiology twice.  He has been referred to hypertension clinic in the near future  He reports being compliant with his current medications  He has had some headaches in recent weeks.  No particular symptoms today.  No blurred vision, no chest pain, no severe headache today, no urinary changes.  He noticed that his pressure was maintaining fairly well until his brother passed of a stroke in August 2023.  Since then his blood pressures have been running high  He denies any specific anxiety or depression.  No new stressors.  He lives alone.  Retired.  His blood pressure this morning at home was 179/123  No other aggravating or relieving factors.    No other c/o.  Past Medical History:  Diagnosis Date   Asthma    mild, no inhaler used   Bilateral renal cysts 04/25/2019   CHF (congestive heart failure) (HCC)    DJD (degenerative joint disease)    has taken tramadol 50mg  q8h in past   Elevated LDL cholesterol level 06/19/2019   Esophageal reflux 04/23/2016   GERD (gastroesophageal reflux disease)    Hyperlipidemia    Hypertension    Morbid obesity (HCC)    weighed in excess of 500 lbs at one point   OSA (obstructive sleep apnea)    did not tolerate CPAP, uses nasal cannula   Proteinuria    GFR 94.12 February 2015   Current Outpatient Medications on File Prior to Visit  Medication Sig Dispense Refill   atorvastatin (LIPITOR) 40 MG tablet Take 1 tablet (40 mg total) by mouth daily. 90 tablet 2   furosemide (LASIX) 80 MG tablet TAKE 1/2 TABLET BY MOUTH TWICE A DAY 90 tablet 0   KLOR-CON M20 20 MEQ tablet TAKE 1 TABLET BY MOUTH EVERY DAY 90 tablet 0    omeprazole (PRILOSEC) 20 MG capsule TAKE 1 CAPSULE BY MOUTH EVERY DAY 90 capsule 0   No current facility-administered medications on file prior to visit.     The following portions of the patient's history were reviewed and updated as appropriate: allergies, current medications, past family history, past medical history, past social history, past surgical history and problem list.  ROS Otherwise as in subjective above     Objective: BP (!) 194/130   Pulse 91   Wt (!) 339 lb 9.6 oz (154 kg)   BMI 53.19 kg/m   General appearance: alert, no distress, well developed, well nourished Eyes: no obvious papillary edema or hemorrhage Neck: supple, no lymphadenopathy, no thyromegaly, no masses, no bruits Heart: RRR, normal S1, S2, no murmurs Lungs: CTA bilaterally, no wheezes, rhonchi, or rales Pulses: 2+ radial pulses, 2+ pedal pulses, normal cap refill Ext: no edema     Assessment: Encounter Diagnoses  Name Primary?   Primary hypertension Yes   OSA (obstructive sleep apnea)    Morbid obesity (HCC)    Mixed hyperlipidemia    Bilateral renal cysts      Plan: We discussed his high blood pressure today.  We discussed his recent cardiology visits.  He has no worrisome symptoms  today.  He was unable to leave a urine.  I reached out to the cardiology office to get their advice given his blood pressure today and the fact that he just saw them a few weeks ago.  He was referred to hypertension clinic but that visit is a few weeks away and he has higher blood pressure today that puts him into the hypertensive urgency category  After reaching out to cardiology, Dr. Marlou Porch gave me some feedback.  We are making several changes today.  The patient did not necessarily want to start another fluid pill spironolactone) since he was already on the Lasix.  we made the other recommended changes though as below:  Recommendations: Stop lisinopril Change to irbesartan 300 mg daily Increase  amlodipine to 10 mg daily Increase carvedilol to 12.5 mg twice daily Patient will continue his furosemide 80 mg, 1/2 tablet twice a day as he has been doing  Since he has already taken his morning dose of lisinopril and amlodipine and lower dose carvedilol this morning, for tonight I will have him do 5 mg of amlodipine and one of the carvedilol 12.5 mg this evening.  Tomorrow morning we will start the new dosing of the medication as below  We disgussed these changes, risk and benefits of medications and I will see him back in 1 week, sooner if needed  Patient voiced understanding and agreement with plan.  I had him repeat back the instructions twice and he seems to understand the plan   I did recommend referral to bariatric clinic in repeat trial of CPAP.  He has seen bariatric clinic in the past and does not want to pursue that at this time.  He wants to work on lifestyle changes and weight loss efforts on his own.  He also declines repeat trial of CPAP at this time.  [name] was seen today for 1 month follow-up.  Diagnoses and all orders for this visit:  Primary hypertension  OSA (obstructive sleep apnea)  Morbid obesity (HCC)  Mixed hyperlipidemia  Bilateral renal cysts  Other orders -     irbesartan (AVAPRO) 300 MG tablet; Take 1 tablet (300 mg total) by mouth daily. -     amLODipine (NORVASC) 10 MG tablet; Take 1 tablet (10 mg total) by mouth daily. -     carvedilol (COREG) 12.5 MG tablet; Take 1 tablet (12.5 mg total) by mouth 2 (two) times daily with a meal.   Follow up: 1 wk

## 2022-06-18 ENCOUNTER — Telehealth: Payer: Self-pay | Admitting: Medical

## 2022-06-18 NOTE — Telephone Encounter (Signed)
Pt called and wanted to let you know that his bp was 131/84 today at 2.00 when he checked it

## 2022-06-20 ENCOUNTER — Ambulatory Visit: Payer: Medicare HMO | Attending: Interventional Cardiology | Admitting: Pharmacist

## 2022-06-20 DIAGNOSIS — I10 Essential (primary) hypertension: Secondary | ICD-10-CM | POA: Diagnosis not present

## 2022-06-20 NOTE — Progress Notes (Signed)
Patient ID: [name]                 DOB: 1961/09/05                      MRN: [id number]      HPI: [name] is a 61 y.o. male referred by Dr. Marlou Porch to HTN clinic. PMH is significant for HTN, HLD, GERD, OSA (untreated), asthma. Seen by [name] twice in Sept. Home and office blood pressures elevated. On Sept 7, lisinopril was added and bystolic was increased to 20mg  daily. At follow up on 8/58, bystolic was changed to carvedilol and amlodipine 2.5mg  daily was started. Patient saw his PCP on 10/9. BP was 194/130. PCP consulted with Dr. Marlou Porch and the following changes were made: Stop lisinopril, Change to irbesartan 300 mg daily, Increase amlodipine to 10 mg daily, Increase carvedilol to 12.5 mg twice daily and patient will continue his furosemide 80 mg, 1/2 tablet twice a day as he has been doing. Patient called his PCP office 10/11 to report his BP was 131/84.   Patient presents today to clinic accompanied by his mother. He brings in his home blood pressure cuff. He states he is a little nervous. Blood pressure at home improved, but still high. Denies dizziness, lightheadedness, blurred vision, SOB, swelling or chest pain. Still having some headaches, but less frequent. Once a day vs 3 times a day.  His placement of his home BP cuff was slightly off and this was corrected. I measured his arm to make sure cuff fit. Arm measured at 41cm. Cuff goes up to 42cm. Cuff also found to be accurate.  Home cuff: Reli on upper arm Home # 1 reading: 158/103 Home # 2 reading: 151/100 Office reading: 150/96  Current HTN meds: irbesartan 300mg  daily, amlodipine 10mg  daily, carvedilol 12.5mg  twice a day, furosemide 40mg  twice a day Previously tried: lisinopril (changed to irbesartan), Bystolic (changed to carvedilol) BP goal: <130/80  Family History:  Family History  Problem Relation Age of Onset   Heart disease Father        MI, DM2, HTN   Hypertension Mother        59 DM2, HTN   Cancer Mother     Diabetes Brother    Stroke Brother    Colon cancer Neg Hx    Esophageal cancer Neg Hx    Inflammatory bowel disease Neg Hx    Liver disease Neg Hx    Pancreatic cancer Neg Hx    Rectal cancer Neg Hx    Stomach cancer Neg Hx     Social History: 3-4 shots of alcohol on weekends, no tobacco, no illicit drugs  Diet:  water, coffee, eats out often  Exercise: yard work  Home BP readings: 148/93, 150/82, 143/97, 142/90   Wt Readings from Last 3 Encounters:  06/16/22 (!) 339 lb 9.6 oz (154 kg)  05/28/22 (!) 341 lb 3.2 oz (154.8 kg)  05/15/22 (!) 339 lb 12.8 oz (154.1 kg)   BP Readings from Last 3 Encounters:  06/20/22 (!) 150/96  06/16/22 (!) 194/130  05/28/22 (!) 140/96   Pulse Readings from Last 3 Encounters:  06/20/22 76  06/16/22 91  05/28/22 78    Renal function: CrCl cannot be calculated (Patient's most recent lab result is older than the maximum 21 days allowed.).  Past Medical History:  Diagnosis Date   Asthma    mild, no inhaler used   Bilateral renal cysts 04/25/2019  CHF (congestive heart failure) (HCC)    DJD (degenerative joint disease)    has taken tramadol 50mg  q8h in past   Elevated LDL cholesterol level 06/19/2019   Esophageal reflux 04/23/2016   GERD (gastroesophageal reflux disease)    Hyperlipidemia    Hypertension    Morbid obesity (HCC)    weighed in excess of 500 lbs at one point   OSA (obstructive sleep apnea)    did not tolerate CPAP, uses nasal cannula   Proteinuria    GFR 94.12 February 2015    Current Outpatient Medications on File Prior to Visit  Medication Sig Dispense Refill   amLODipine (NORVASC) 10 MG tablet Take 1 tablet (10 mg total) by mouth daily. 30 tablet 2   atorvastatin (LIPITOR) 40 MG tablet Take 1 tablet (40 mg total) by mouth daily. 90 tablet 2   carvedilol (COREG) 12.5 MG tablet Take 1 tablet (12.5 mg total) by mouth 2 (two) times daily with a meal. 60 tablet 2   furosemide (LASIX) 80 MG tablet TAKE 1/2 TABLET BY MOUTH  TWICE A DAY 90 tablet 0   irbesartan (AVAPRO) 300 MG tablet Take 1 tablet (300 mg total) by mouth daily. 30 tablet 2   KLOR-CON M20 20 MEQ tablet TAKE 1 TABLET BY MOUTH EVERY DAY 90 tablet 0   omeprazole (PRILOSEC) 20 MG capsule TAKE 1 CAPSULE BY MOUTH EVERY DAY 90 capsule 0   No current facility-administered medications on file prior to visit.    Allergies  Allergen Reactions   Tomato Rash    Blood pressure (!) 150/96, pulse 76, SpO2 96 %.   Assessment/Plan:  1. Hypertension -   HTN (hypertension) Assessment:   Blood pressure elevated in clinic, but improved from previous Patient is not very active, encouraged him to start walking Reviewed sodium in his diet, encouraged him to eat food from home and pay attention to food labels Proper technique for checking blood pressure reviewed with patient Advised to limit alcohol, no more than 2 shots and to measure amount Since medication changes were just made 3 days ago, will not adjust at this current moment  Plan:  Continue irbesartan 300mg  daily, amlodipine 10mg  daily, carvedilol 12.5mg  twice a day, furosemide 40mg  twice a day Call me if BP increases a great deal (diastolic >100, systolic >180) I encouraged patient to start walking. Start with 5-10 min 3 days a week. Increase days of the week and time as tolerated. Goal would be 30 min most days of the week. If he is unable to do 30 min in one time split into time increments of 5 or more min. Decrease alcohol consumption to no more than 2 drinks per day. Make sure 1 drink is really 1.5oz of Whiskey. Follow up in office Oct 24th. Will need a BMP at that appointment.  [email or web address]   Thank you  , Pharm.D, BCPS, CPP [facility] Division of [facility] [address] [place], [place]  Phone: [phone]; Fax: [phone]

## 2022-06-20 NOTE — Patient Instructions (Addendum)
Summary of today's discussion  Start walking for 10 min a few days a week. Increase both # of days and min as able. Would love to see you walk daily for 30 min or more eventually  2. Please check your blood pressure 1-2 times per day. Please write down your readings and bring them with you to your next appointment  3.Continue irbesartan 300mg  daily, amlodipine 10mg  daily, carvedilol 12.5mg  twice a day and furosemide 40mg  twice a day  4. Limit salt, make sure to read food labels. Try to keep sodium to 2000mg  or less per day  5.Call me at [phone]   Your blood pressure goal is <130/80  To check your pressure at home you will need to:  1. Sit up in a chair, with feet flat on the floor and back supported. Do not cross your ankles or legs. 2. Rest your left arm so that the cuff is about heart level. If the cuff goes on your upper arm,  then just relax the arm on the table, arm of the chair or your lap. If you have a wrist cuff, we  suggest relaxing your wrist against your chest (think of it as Pledging the Flag with the  wrong arm).  3. Place the cuff snugly around your arm, about 1 inch above the crook of your elbow. The  cords should be inside the groove of your elbow.  4. Sit quietly, with the cuff in place, for about 5 minutes. After that 5 minutes press the power  button to start a reading. 5. Do not talk or move while the reading is taking place.  6. Record your readings on a sheet of paper. Although most cuffs have a memory, it is often  easier to see a pattern developing when the numbers are all in front of you.  7. You can repeat the reading after 1-3 minutes if it is recommended  Make sure your bladder is empty and you have not had caffeine or tobacco within the last 30 min  Always bring your blood pressure log with you to your appointments. If you have not brought your monitor in to be double checked for accuracy, please bring it to your next appointment.  You can find a list  of validated (accurate) blood pressure cuffs at [employer]   Important lifestyle changes to control high blood pressure  Intervention  Effect on the BP  Lose extra pounds and watch your waistline Weight loss is one of the most effective lifestyle changes for controlling blood pressure. If you're overweight or obese, losing even a small amount of weight can help reduce blood pressure. Blood pressure might go down by about 1 millimeter of mercury (mm Hg) with each kilogram (about 2.2 pounds) of weight lost.  Exercise regularly As a general goal, aim for at least 30 minutes of moderate physical activity every day. Regular physical activity can lower high blood pressure by about 5 to 8 mm Hg.  Eat a healthy diet Eating a diet rich in whole grains, fruits, vegetables, and low-fat dairy products and low in saturated fat and cholesterol. A healthy diet can lower high blood pressure by up to 11 mm Hg.  Reduce salt (sodium) in your diet Even a small reduction of sodium in the diet can improve heart health and reduce high blood pressure by about 5 to 6 mm Hg.  Limit alcohol One drink equals 12 ounces of beer, 5 ounces of wine, or 1.5 ounces of 80-proof liquor.  Limiting alcohol to less than one drink a day for women or two drinks a day for men can help lower blood pressure by about 4 mm Hg.   Please call me at [phone] with any questions.

## 2022-06-20 NOTE — Assessment & Plan Note (Addendum)
Assessment:    Blood pressure elevated in clinic, but improved from previous  Patient is not very active, encouraged him to start walking  Reviewed sodium in his diet, encouraged him to eat food from home and pay attention to food labels  Proper technique for checking blood pressure reviewed with patient  Advised to limit alcohol, no more than 2 shots and to measure amount  Since medication changes were just made 3 days ago, will not adjust at this current moment  Plan:   Continue irbesartan 300mg  daily, amlodipine 10mg  daily, carvedilol 12.5mg  twice a day, furosemide 40mg  twice a day  Call me if BP increases a great deal (diastolic >275, systolic >170)  I encouraged patient to start walking. Start with 5-10 min 3 days a week. Increase days of the week and time as tolerated. Goal would be 30 min most days of the week. If he is unable to do 30 min in one time split into time increments of 5 or more min.  Decrease alcohol consumption to no more than 2 drinks per day. Make sure 1 drink is really 1.5oz of Whiskey.  Follow up in office Oct 24th. Will need a BMP at that appointment.

## 2022-06-25 ENCOUNTER — Telehealth (INDEPENDENT_AMBULATORY_CARE_PROVIDER_SITE_OTHER): Payer: Medicare HMO | Admitting: Medical

## 2022-06-25 VITALS — BP 138/96 | HR 75 | Wt 344.0 lb

## 2022-06-25 DIAGNOSIS — G4733 Obstructive sleep apnea (adult) (pediatric): Secondary | ICD-10-CM

## 2022-06-25 DIAGNOSIS — I10 Essential (primary) hypertension: Secondary | ICD-10-CM

## 2022-06-25 DIAGNOSIS — Z87448 Personal history of other diseases of urinary system: Secondary | ICD-10-CM

## 2022-06-25 DIAGNOSIS — Z2821 Immunization not carried out because of patient refusal: Secondary | ICD-10-CM | POA: Diagnosis not present

## 2022-06-25 DIAGNOSIS — Z7185 Encounter for immunization safety counseling: Secondary | ICD-10-CM | POA: Diagnosis not present

## 2022-06-25 DIAGNOSIS — E782 Mixed hyperlipidemia: Secondary | ICD-10-CM

## 2022-06-25 NOTE — Progress Notes (Signed)
Subjective:  [name] is a 61 y.o. male who presents for Chief Complaint  Patient presents with   follow-up on BP    Follow-up on bp, no concerns- yesterday- 144/104, 154/104, 166/110, evening time-  155/102, 139/86, this morning 137/91, 131/91     Here for BP follow up.  This is a 1 week follow up.  Here with his mother today  Medical history significant for hypertension, sleep apnea, proteinuria, CHF, GERD.  I saw him on 06/16/2022 for hypertensive urgency.  I coordinated with cardiology and we made some adjustments with his medication.  He saw cardiology pharmacy consult 09/20/2021.  Feeling ok today.  He has had some headaches in recent weeks.  No particular symptoms today.  No blurred vision, no chest pain, no severe headache today, no urinary changes.  He noticed that his pressure was maintaining fairly well until his brother passed of a stroke in August 2023.  Since then his blood pressures have been running high  He denies any specific anxiety or depression.  No new stressors.  He lives alone.  Retired.  Compliant with medications that were modified last week.    He is compliant with the following: Irbesartan 300mg  daily Amlodipine 10mg  daily Carvedilol 12.5mg  twice a day furosemide 40mg  twice a day  Compliant with statin  No other aggravating or relieving factors.    No other c/o.  Past Medical History:  Diagnosis Date   Asthma    mild, no inhaler used   Bilateral renal cysts 04/25/2019   CHF (congestive heart failure) (HCC)    DJD (degenerative joint disease)    has taken tramadol 50mg  q8h in past   Elevated LDL cholesterol level 06/19/2019   Esophageal reflux 04/23/2016   GERD (gastroesophageal reflux disease)    Hyperlipidemia    Hypertension    Morbid obesity (HCC)    weighed in excess of 500 lbs at one point   OSA (obstructive sleep apnea)    did not tolerate CPAP, uses nasal cannula   Proteinuria    GFR 94.12 February 2015   Current Outpatient Medications on  File Prior to Visit  Medication Sig Dispense Refill   amLODipine (NORVASC) 10 MG tablet Take 1 tablet (10 mg total) by mouth daily. 30 tablet 2   atorvastatin (LIPITOR) 40 MG tablet Take 1 tablet (40 mg total) by mouth daily. 90 tablet 2   carvedilol (COREG) 12.5 MG tablet Take 1 tablet (12.5 mg total) by mouth 2 (two) times daily with a meal. 60 tablet 2   furosemide (LASIX) 80 MG tablet TAKE 1/2 TABLET BY MOUTH TWICE A DAY 90 tablet 0   irbesartan (AVAPRO) 300 MG tablet Take 1 tablet (300 mg total) by mouth daily. 30 tablet 2   KLOR-CON M20 20 MEQ tablet TAKE 1 TABLET BY MOUTH EVERY DAY 90 tablet 0   omeprazole (PRILOSEC) 20 MG capsule TAKE 1 CAPSULE BY MOUTH EVERY DAY 90 capsule 0   No current facility-administered medications on file prior to visit.     The following portions of the patient's history were reviewed and updated as appropriate: allergies, current medications, past family history, past medical history, past social history, past surgical history and problem list.  ROS Otherwise as in subjective above     Objective: BP (!) 138/96   Pulse 75   Wt (!) 344 lb (156 kg)   BMI 53.88 kg/m   BP Readings from Last 3 Encounters:  06/25/22 (!) 138/96  06/20/22 (!) 150/96  06/16/22 (!) 194/130   General appearance: alert, no distress, well developed, well nourished Heart: RRR, normal S1, S2, no murmurs Lungs: CTA bilaterally, no wheezes, rhonchi, or rales Pulses: 2+ radial pulses, 2+ pedal pulses, normal cap refill Ext: no edema    Assessment: Encounter Diagnoses  Name Primary?   Primary hypertension Yes   History of proteinuria syndrome    OSA (obstructive sleep apnea)    Mixed hyperlipidemia    Vaccine counseling    Influenza vaccination declined       Plan: Here for 1 week follow-up on hypertension.  This was mainly to make sure he did not get lost to follow-up.  Fortunately he did see clinic in cardiology since I saw him within the past week.  No changes  in BP medications today.  Followup 07/01/22 with cardiology office as scheduled.  His recent basic metabolic panel shows normal creatinine and electrolytes.  Blood sugar was nonfasting elevated.  Impaired glucose-hemoglobin A1c 5.7% a month ago.  Continue efforts to work on lifestyle changes healthy diet and exercise  Lipids reviewed from February 2023 showing LDL 105.  Otherwise lipids were okay  Echocardiogram from December 2019 reviewed showing mild LVH, EF 65 to 70%, no wall motion abnormalities.   Vaccines: Immunization History  Administered Date(s) Administered   Influenza-Unspecified 07/01/2011, 07/19/2012   PFIZER Comirnaty(Gray Top)Covid-19 Tri-Sucrose Vaccine 11/21/2020   PFIZER(Purple Top)SARS-COV-2 Vaccination 03/27/2020, 04/17/2020   Pneumococcal Polysaccharide-23 11/03/2011   Td 11/03/2011   Tdap 02/16/2014   I recommend shingles vaccine, RSV vaccine, yearly flu shot, COVID booster  He declines vaccines today.   As of his recent October visit, I did recommend referral to bariatric clinic in repeat trial of CPAP.  He has seen bariatric clinic in the past and does not want to pursue that at this time.  He wants to work on lifestyle changes and weight loss efforts on his own.  He also declines repeat trial of CPAP at this time.  He is agreeable to trial of Wegovy but unfortunately pharmacies are on back order until 09/2022.   [name] was seen today for follow-up on bp.  Diagnoses and all orders for this visit:  Primary hypertension  History of proteinuria syndrome  OSA (obstructive sleep apnea)  Mixed hyperlipidemia  Vaccine counseling  Influenza vaccination declined    Follow up: 65mo fasting

## 2022-07-01 ENCOUNTER — Ambulatory Visit: Payer: Medicare HMO | Attending: Cardiology | Admitting: Pharmacist

## 2022-07-01 VITALS — BP 148/95 | HR 75

## 2022-07-01 DIAGNOSIS — I10 Essential (primary) hypertension: Secondary | ICD-10-CM | POA: Diagnosis not present

## 2022-07-01 MED ORDER — CHLORTHALIDONE 25 MG PO TABS: 25.0000 mg | ORAL_TABLET | Freq: Every day | ORAL | 3 refills | Status: DC

## 2022-07-01 NOTE — Assessment & Plan Note (Signed)
Assessment:  Blood pressure improved but still mostly above goal of <130/80  Patient has not increased exercise as we discussed previously  He is having only 2 drinks of ETOH per day but is not measuring shot  Swelling is well controlled on furosemide 40mg  BID  Plan:  Add chlorthalidone 12.5mg  daily  Check BMP 07/16/22  Decrease furosemide to 40mg  daily due to addition of chlorthalidone. Patient advised that if he notices an increase to weight >3lb overnight or increased swelling he can take an additional furosemide  Continue irbesartan 300mg  daily, amlodipine 10mg  daily, carvedilol 12.5mg  twice a day  Continue checking BP at home  Follow up in clinic- next available 12/1  Goal set of walking at least 10 min per day

## 2022-07-01 NOTE — Patient Instructions (Addendum)
Summary of today's discussion  Start taking chlorthalidone 12.5mg  (1/2 tablet) daily  2. Decrease furosemide to 40mg  (1/2 tablet) ONCE a day  3. Continue irbesartan 300mg  daily, amlodipine 10mg  daily, carvedilol 12.5mg  twice a day  4. Start walking daily. Try to walk to the mail box and back (or further) a few times )~10 min  5. Continue checking blood pressure at home   Your blood pressure goal is <130/80  To check your pressure at home you will need to:  1. Sit up in a chair, with feet flat on the floor and back supported. Do not cross your ankles or legs. 2. Rest your left arm so that the cuff is about heart level. If the cuff goes on your upper arm,  then just relax the arm on the table, arm of the chair or your lap. If you have a wrist cuff, we  suggest relaxing your wrist against your chest (think of it as Pledging the Flag with the  wrong arm).  3. Place the cuff snugly around your arm, about 1 inch above the crook of your elbow. The  cords should be inside the groove of your elbow.  4. Sit quietly, with the cuff in place, for about 5 minutes. After that 5 minutes press the power  button to start a reading. 5. Do not talk or move while the reading is taking place.  6. Record your readings on a sheet of paper. Although most cuffs have a memory, it is often  easier to see a pattern developing when the numbers are all in front of you.  7. You can repeat the reading after 1-3 minutes if it is recommended  Make sure your bladder is empty and you have not had caffeine or tobacco within the last 30 min  Always bring your blood pressure log with you to your appointments. If you have not brought your monitor in to be double checked for accuracy, please bring it to your next appointment.  You can find a list of validated (accurate) blood pressure cuffs at [employer]   Important lifestyle changes to control high blood pressure  Intervention  Effect on the BP  Lose extra pounds and  watch your waistline Weight loss is one of the most effective lifestyle changes for controlling blood pressure. If you're overweight or obese, losing even a small amount of weight can help reduce blood pressure. Blood pressure might go down by about 1 millimeter of mercury (mm Hg) with each kilogram (about 2.2 pounds) of weight lost.  Exercise regularly As a general goal, aim for at least 30 minutes of moderate physical activity every day. Regular physical activity can lower high blood pressure by about 5 to 8 mm Hg.  Eat a healthy diet Eating a diet rich in whole grains, fruits, vegetables, and low-fat dairy products and low in saturated fat and cholesterol. A healthy diet can lower high blood pressure by up to 11 mm Hg.  Reduce salt (sodium) in your diet Even a small reduction of sodium in the diet can improve heart health and reduce high blood pressure by about 5 to 6 mm Hg.  Limit alcohol One drink equals 12 ounces of beer, 5 ounces of wine, or 1.5 ounces of 80-proof liquor.  Limiting alcohol to less than one drink a day for women or two drinks a day for men can help lower blood pressure by about 4 mm Hg.   Please call me at [phone] with any questions.

## 2022-07-01 NOTE — Progress Notes (Signed)
Patient ID: IDA UPPAL                 DOB: [birth year]                      MRN: [id number]      HPI: [name] is a 61 y.o. male referred by Dr. Marlou Porch to HTN clinic. PMH is significant for HTN, HLD, GERD, OSA (untreated), asthma. Seen by Jaquelyn Bitter twice in Sept. Home and office blood pressures elevated. On Sept 7, lisinopril was added and bystolic was increased to 20mg  daily. At follow up on 7/82, bystolic was changed to carvedilol and amlodipine 2.5mg  daily was started. Patient saw his PCP on 10/9. BP was 194/130. PCP consulted with Dr. Marlou Porch and the following changes were made: Stop lisinopril, Change to irbesartan 300 mg daily, Increase amlodipine to 10 mg daily, Increase carvedilol to 12.5 mg twice daily and patient will continue his furosemide 80 mg, 1/2 tablet twice a day as he has been doing. Patient called his PCP office 10/11 to report his BP was 131/84.   At last visit, no medication changes were made as medication changes had just been made 3 days prior. He was encouraged to increase exercise and decrease exercise.  Patient presents today to clinic accompanied by his mother. He brings in his home blood pressure readings. They vary. A few at goal. He states he is a little nervous. Denies dizziness, lightheadedness, blurred vision, SOB, swelling or chest pain. No headaches. He walk to mail box everyday and some yard work, but not much exercise.   Home cuff: Reli on upper arm Home # 1 reading: 158/103 Home # 2 reading: 151/100 Office reading: 150/96  Current HTN meds: irbesartan 300mg  daily, amlodipine 10mg  daily, carvedilol 12.5mg  twice a day, furosemide 40mg  twice a day Previously tried: lisinopril (changed to irbesartan), Bystolic (changed to carvedilol) BP goal: <130/80  Family History:  Family History  Problem Relation Age of Onset   Heart disease Father        MI, DM2, HTN   Hypertension Mother        9 DM2, HTN   Cancer Mother    Diabetes Brother    Stroke  Brother    Colon cancer Neg Hx    Esophageal cancer Neg Hx    Inflammatory bowel disease Neg Hx    Liver disease Neg Hx    Pancreatic cancer Neg Hx    Rectal cancer Neg Hx    Stomach cancer Neg Hx     Social History: 3-4 shots of alcohol on weekends, no tobacco, no illicit drugs  Diet:  water, coffee, eats out often  Exercise: yard work  Home BP readings: 137/91, 122/86, 162/113, 112/80, 107/75, 145/98, 124/83, 113/73, 101/68, 126/83, 113/77, 144/99, 125/85, 162/102 HR 60-70's  Wt Readings from Last 3 Encounters:  06/25/22 (!) 344 lb (156 kg)  06/16/22 (!) 339 lb 9.6 oz (154 kg)  05/28/22 (!) 341 lb 3.2 oz (154.8 kg)   BP Readings from Last 3 Encounters:  07/01/22 (!) 148/95  06/25/22 (!) 138/96  06/20/22 (!) 150/96   Pulse Readings from Last 3 Encounters:  07/01/22 75  06/25/22 75  06/20/22 76    Renal function: CrCl cannot be calculated (Patient's most recent lab result is older than the maximum 21 days allowed.).  Past Medical History:  Diagnosis Date   Asthma    mild, no inhaler used   Bilateral renal cysts 04/25/2019  CHF (congestive heart failure) (HCC)    DJD (degenerative joint disease)    has taken tramadol 50mg  q8h in past   Elevated LDL cholesterol level 06/19/2019   Esophageal reflux 04/23/2016   GERD (gastroesophageal reflux disease)    Hyperlipidemia    Hypertension    Morbid obesity ([place])    weighed in excess of 500 lbs at one point   OSA (obstructive sleep apnea)    did not tolerate CPAP, uses nasal cannula   Proteinuria    GFR 94.12 February 2015    Current Outpatient Medications on File Prior to Visit  Medication Sig Dispense Refill   furosemide (LASIX) 80 MG tablet Take 0.5 tablets (40 mg total) by mouth daily. 45 tablet 3   amLODipine (NORVASC) 10 MG tablet Take 1 tablet (10 mg total) by mouth daily. 30 tablet 2   atorvastatin (LIPITOR) 40 MG tablet Take 1 tablet (40 mg total) by mouth daily. 90 tablet 2   carvedilol (COREG) 12.5 MG tablet  Take 1 tablet (12.5 mg total) by mouth 2 (two) times daily with a meal. 60 tablet 2   irbesartan (AVAPRO) 300 MG tablet Take 1 tablet (300 mg total) by mouth daily. 30 tablet 2   KLOR-CON M20 20 MEQ tablet TAKE 1 TABLET BY MOUTH EVERY DAY 90 tablet 0   omeprazole (PRILOSEC) 20 MG capsule TAKE 1 CAPSULE BY MOUTH EVERY DAY 90 capsule 0   No current facility-administered medications on file prior to visit.    Allergies  Allergen Reactions   Tomato Rash    Blood pressure (!) 148/95, pulse 75, SpO2 98 %.   Assessment/Plan:  HYPERTENSION CONTROL Vitals:   07/01/22 1450 07/01/22 1457  BP: (!) 152/100 (!) 148/95    The patient's blood pressure is elevated above target today.  In order to address the patient's elevated BP: A new medication was prescribed today.       HTN (hypertension) Assessment: Blood pressure improved but still mostly above goal of <130/80 Patient has not increased exercise as we discussed previously He is having only 2 drinks of ETOH per day but is not measuring shot Swelling is well controlled on furosemide 40mg  BID  Plan: Add chlorthalidone 12.5mg  daily Check BMP 07/16/22 Decrease furosemide to 40mg  daily due to addition of chlorthalidone. Patient advised that if he notices an increase to weight >3lb overnight or increased swelling he can take an additional furosemide Continue irbesartan 300mg  daily, amlodipine 10mg  daily, carvedilol 12.5mg  twice a day Continue checking BP at home Follow up in clinic- next available 12/1 Goal set of walking at least 10 min per day   Thank you  Ramond Dial, Pharm.D, BCPS, CPP [facility] of [facility] [address] [place],  [address]  Phone: [phone]; Fax: [phone]

## 2022-07-16 ENCOUNTER — Ambulatory Visit: Payer: Medicare HMO | Attending: Cardiology

## 2022-07-16 DIAGNOSIS — I10 Essential (primary) hypertension: Secondary | ICD-10-CM

## 2022-07-17 ENCOUNTER — Telehealth: Payer: Self-pay | Admitting: Pharmacist

## 2022-07-17 LAB — BASIC METABOLIC PANEL
BUN/Creatinine Ratio: 20 (ref 10–24)
BUN: 27 mg/dL (ref 8–27)
CO2: 22 mmol/L (ref 20–29)
Calcium: 9.4 mg/dL (ref 8.6–10.2)
Chloride: 93 mmol/L — ABNORMAL LOW (ref 96–106)
Creatinine, Ser: 1.33 mg/dL — ABNORMAL HIGH (ref 0.76–1.27)
Glucose: 103 mg/dL — ABNORMAL HIGH (ref 70–99)
Potassium: 3.9 mmol/L (ref 3.5–5.2)
Sodium: 133 mmol/L — ABNORMAL LOW (ref 134–144)
eGFR: 61 mL/min/{1.73_m2} (ref >59)

## 2022-07-17 MED ORDER — FUROSEMIDE 20 MG PO TABS: 20.0000 mg | ORAL_TABLET | Freq: Every day | ORAL | 3 refills | Status: DC

## 2022-07-17 NOTE — Telephone Encounter (Signed)
131/95 HR 71 121/80 125/93 110/75 157/103 125/82 128/94 116/76 141/100 112/78 Spoke with patient about his labs. Scr slight bump, Na tad low. BUN/Scr 20. Patient has no swelling. Will decrease furosemide to 20mg  daily Continue chlorthalidone 25mg  daily, irbesartan 300mg  daily, amlodipine 10mg  daily, carvedilol 12.5mg  twice a day  Follow up in office 12/1

## 2022-07-26 ENCOUNTER — Other Ambulatory Visit: Payer: Self-pay | Admitting: Medical

## 2022-07-26 DIAGNOSIS — E876 Hypokalemia: Secondary | ICD-10-CM

## 2022-08-03 ENCOUNTER — Other Ambulatory Visit: Payer: Self-pay | Admitting: Physician Assistant

## 2022-08-03 DIAGNOSIS — E782 Mixed hyperlipidemia: Secondary | ICD-10-CM

## 2022-08-08 ENCOUNTER — Ambulatory Visit: Payer: Medicare HMO | Attending: Cardiovascular Disease | Admitting: Pharmacist

## 2022-08-08 VITALS — BP 102/82 | HR 77

## 2022-08-08 DIAGNOSIS — I10 Essential (primary) hypertension: Secondary | ICD-10-CM | POA: Diagnosis not present

## 2022-08-08 NOTE — Patient Instructions (Signed)
Summary of today's discussion  1.Continue irbesartan 300mg  daily, amlodipine 10mg  daily, carvedilol 12.5mg  twice a day, furosemide 20mg  daily, chlorthalidone 25mg  daily  2.I will call you Monday with your lab results  3. Increase physical activity. Find some good music or podcasts to listen to and go for a walk/bike ride  4.Keep checking blood pressure at home at least a few times a week  5. Call me if consistently >130/80   Your blood pressure goal is <130/80  To check your pressure at home you will need to:  1. Sit up in a chair, with feet flat on the floor and back supported. Do not cross your ankles or legs. 2. Rest your left arm so that the cuff is about heart level. If the cuff goes on your upper arm,  then just relax the arm on the table, arm of the chair or your lap. If you have a wrist cuff, we  suggest relaxing your wrist against your chest (think of it as Pledging the Flag with the  wrong arm).  3. Place the cuff snugly around your arm, about 1 inch above the crook of your elbow. The  cords should be inside the groove of your elbow.  4. Sit quietly, with the cuff in place, for about 5 minutes. After that 5 minutes press the power  button to start a reading. 5. Do not talk or move while the reading is taking place.  6. Record your readings on a sheet of paper. Although most cuffs have a memory, it is often  easier to see a pattern developing when the numbers are all in front of you.  7. You can repeat the reading after 1-3 minutes if it is recommended  Make sure your bladder is empty and you have not had caffeine or tobacco within the last 30 min  Always bring your blood pressure log with you to your appointments. If you have not brought your monitor in to be double checked for accuracy, please bring it to your next appointment.  You can find a list of validated (accurate) blood pressure cuffs at [employer]   Important lifestyle changes to control high blood pressure   Intervention  Effect on the BP  Lose extra pounds and watch your waistline Weight loss is one of the most effective lifestyle changes for controlling blood pressure. If you're overweight or obese, losing even a small amount of weight can help reduce blood pressure. Blood pressure might go down by about 1 millimeter of mercury (mm Hg) with each kilogram (about 2.2 pounds) of weight lost.  Exercise regularly As a general goal, aim for at least 30 minutes of moderate physical activity every day. Regular physical activity can lower high blood pressure by about 5 to 8 mm Hg.  Eat a healthy diet Eating a diet rich in whole grains, fruits, vegetables, and low-fat dairy products and low in saturated fat and cholesterol. A healthy diet can lower high blood pressure by up to 11 mm Hg.  Reduce salt (sodium) in your diet Even a small reduction of sodium in the diet can improve heart health and reduce high blood pressure by about 5 to 6 mm Hg.  Limit alcohol One drink equals 12 ounces of beer, 5 ounces of wine, or 1.5 ounces of 80-proof liquor.  Limiting alcohol to less than one drink a day for women or two drinks a day for men can help lower blood pressure by about 4 mm Hg.   Please call  me at [phone] with any questions.

## 2022-08-08 NOTE — Assessment & Plan Note (Signed)
Assessment:  Home blood pressures mostly at goal Limited exercise- he was encouraged again to increase physical activity Denies dizziness, headaches or swelling No LEE seen on exam, appear euvolemic Compliant with medications SBP well controlled, DBP slightly above goal Slight bump in scr with chlorthalidone start, needs recheck BMP since decreasing furosemide  Plan: Continue irbesartan 300mg  daily, amlodipine 10mg  daily, carvedilol 12.5mg  twice a day, furosemide 20mg  daily, chlorthalidone 25mg  daily Recheck BMP today With SBP on the lower side, will not adjust medications further, will allow a little higher DBP (80-84) Strongly encouraged increased physical activity Follow up as needed

## 2022-08-08 NOTE — Progress Notes (Unsigned)
Patient ID: [name]                 DOB: Dec 26, 1960                      MRN: [id number]      HPI: [name] is a 61 y.o. male referred by Dr. Marlou Fitzpatrick to HTN clinic. PMH is significant for HTN, HLD, GERD, OSA (untreated), asthma. Seen by Jaquelyn Bitter twice in Sept. Home and office blood pressures elevated. On Sept 7, lisinopril was added and bystolic was increased to 20mg  daily. At follow up on 123456, bystolic was changed to carvedilol and amlodipine 2.5mg  daily was started. Patient saw his PCP on 10/9. BP was 194/130. PCP consulted with Dr. Marlou Fitzpatrick and the following changes were made: Stop lisinopril, Change to irbesartan 300 mg daily, Increase amlodipine to 10 mg daily, Increase carvedilol to 12.5 mg twice daily and patient will continue his furosemide 80 mg, 1/2 tablet twice a day as he has been doing. Patient called his PCP office 10/11 to report his BP was 131/84.   At visit on 10/24, no medication changes were made as medication changes had just been made 3 days prior. He was encouraged to increase exercise and decrease exercise. At last visit, blood pressure was 148/95. Chlorthalidone 25mg  daily was started. Follow up BMP with slight scr bump, Na 133. Furosemide was decreased to 20mg  daily.  Patient presents today to clinic accompanied by his mother. He brings in his home blood pressure readings. He has many at goal. Recently he had a few days where his blood pressure was higher, but he states he was upset and missed his brother. He cuff runs a little high for diastolic.   Has not been doing much for exercise other than walking to the mailbox and back. Takes about 6 min total. He states he just needs to get back into it.   Home cuff: Reli on upper arm Home # 1 reading: 158/103 Home # 2 reading: 151/100 Office reading: 150/96  Current HTN meds: irbesartan 300mg  daily, amlodipine 10mg  daily, carvedilol 12.5mg  twice a day, furosemide 20mg  daily, chlorthalidone 25mg  daily Previously tried:  lisinopril (changed to irbesartan), Bystolic (changed to carvedilol) BP goal: <130/80  Family History:  Family History  Problem Relation Age of Onset   Heart disease Father        MI, DM2, HTN   Hypertension Mother        71 DM2, HTN   Cancer Mother    Diabetes Brother    Stroke Brother    Colon cancer Neg Hx    Esophageal cancer Neg Hx    Inflammatory bowel disease Neg Hx    Liver disease Neg Hx    Pancreatic cancer Neg Hx    Rectal cancer Neg Hx    Stomach cancer Neg Hx     Social History: 3-4 shots of alcohol on weekends, no tobacco, no illicit drugs  Diet:  water, coffee, eats out often  Exercise: yard work, Microbiologist the house, walks to [employer] daily (takes 6 min)  Home BP readings: 118/85, 130/89, 122/74, 107/71, 110/76, 113/79, 122/82, 109/76, 111/73, 140/79, 137/88, 120/82, 150/100, 132/91, 134/93, 127/83, 133/94, 127/88 HR 60-70's   Wt Readings from Last 3 Encounters:  06/25/22 (!) 344 lb (156 kg)  06/16/22 (!) 339 lb 9.6 oz (154 kg)  05/28/22 (!) 341 lb 3.2 oz (154.8 kg)   BP Readings from Last 3 Encounters:  08/08/22 102/82  07/01/22 (!) 148/95  06/25/22 (!) 138/96   Pulse Readings from Last 3 Encounters:  08/08/22 77  07/01/22 75  06/25/22 75    Renal function: CrCl cannot be calculated (Patient's most recent lab result is older than the maximum 21 days allowed.).  Past Medical History:  Diagnosis Date   Asthma    mild, no inhaler used   Bilateral renal cysts 04/25/2019   CHF (congestive heart failure) (HCC)    DJD (degenerative joint disease)    has taken tramadol 50mg  q8h in past   Elevated LDL cholesterol level 06/19/2019   Esophageal reflux 04/23/2016   GERD (gastroesophageal reflux disease)    Hyperlipidemia    Hypertension    Morbid obesity (HCC)    weighed in excess of 500 lbs at one point   OSA (obstructive sleep apnea)    did not tolerate CPAP, uses nasal cannula   Proteinuria    GFR 94.12 February 2015    Current Outpatient Medications  on File Prior to Visit  Medication Sig Dispense Refill   amLODipine (NORVASC) 10 MG tablet Take 1 tablet (10 mg total) by mouth daily. 30 tablet 2   atorvastatin (LIPITOR) 40 MG tablet TAKE 1 TABLET BY MOUTH EVERY DAY 90 tablet 1   carvedilol (COREG) 12.5 MG tablet Take 1 tablet (12.5 mg total) by mouth 2 (two) times daily with a meal. 60 tablet 2   chlorthalidone (HYGROTON) 25 MG tablet Take 1 tablet (25 mg total) by mouth daily. 45 tablet 3   furosemide (LASIX) 20 MG tablet Take 1 tablet (20 mg total) by mouth daily. 90 tablet 3   irbesartan (AVAPRO) 300 MG tablet Take 1 tablet (300 mg total) by mouth daily. 30 tablet 2   KLOR-CON M20 20 MEQ tablet TAKE 1 TABLET BY MOUTH EVERY DAY 90 tablet 0   omeprazole (PRILOSEC) 20 MG capsule TAKE 1 CAPSULE BY MOUTH EVERY DAY 90 capsule 0   No current facility-administered medications on file prior to visit.    Allergies  Allergen Reactions   Tomato Rash    Blood pressure 102/82, pulse 77, SpO2 97 %.   Assessment/Plan:     HTN (hypertension) Assessment:  Home blood pressures mostly at goal Limited exercise- he was encouraged again to increase physical activity Denies dizziness, headaches or swelling No LEE seen on exam, appear euvolemic Compliant with medications SBP well controlled, DBP slightly above goal Slight bump in scr with chlorthalidone start, needs recheck BMP since decreasing furosemide  Plan: Continue irbesartan 300mg  daily, amlodipine 10mg  daily, carvedilol 12.5mg  twice a day, furosemide 20mg  daily, chlorthalidone 25mg  daily Recheck BMP today With SBP on the lower side, will not adjust medications further, will allow a little higher DBP (80-84) Strongly encouraged increased physical activity Follow up as needed    Thank you  14 February 2015, Pharm.D, BCPS, CPP [facility] [address] [place], [place]  Phone: [phone]; Fax: [phone]

## 2022-08-09 LAB — BASIC METABOLIC PANEL
BUN/Creatinine Ratio: 14 (ref 10–24)
BUN: 20 mg/dL (ref 8–27)
CO2: 25 mmol/L (ref 20–29)
Calcium: 9.7 mg/dL (ref 8.6–10.2)
Chloride: 97 mmol/L (ref 96–106)
Creatinine, Ser: 1.46 mg/dL — ABNORMAL HIGH (ref 0.76–1.27)
Glucose: 89 mg/dL (ref 70–99)
Potassium: 4.2 mmol/L (ref 3.5–5.2)
Sodium: 139 mmol/L (ref 134–144)
eGFR: 54 mL/min/{1.73_m2} — ABNORMAL LOW (ref >59)

## 2022-08-11 ENCOUNTER — Encounter: Payer: Self-pay | Admitting: *Deleted

## 2022-08-11 ENCOUNTER — Telehealth: Payer: Self-pay | Admitting: Pharmacist

## 2022-08-11 DIAGNOSIS — E876 Hypokalemia: Secondary | ICD-10-CM

## 2022-08-11 DIAGNOSIS — I10 Essential (primary) hypertension: Secondary | ICD-10-CM

## 2022-08-11 NOTE — Telephone Encounter (Signed)
BP better controlled on chlorthalidone. Still a slight increase is scr. Pt had no swelling on exam. Will stop furosemide and KCL. Recheck in 2 weeks at apt with [facility].

## 2022-08-14 ENCOUNTER — Other Ambulatory Visit: Payer: Self-pay | Admitting: Medical

## 2022-08-14 DIAGNOSIS — K219 Gastro-esophageal reflux disease without esophagitis: Secondary | ICD-10-CM

## 2022-08-24 NOTE — Progress Notes (Unsigned)
Office Visit    Patient Name: [name] Date of Encounter: 08/25/2022  Primary Care Provider:  Carlena Hurl, PA-C Primary Cardiologist:  Candee Furbish, MD Primary Electrophysiologist: None  Chief Complaint    [name] is a 61 y.o. male with PMH of HTN, HLD, GERD, OSA, asthma, family history of CAD, HFpEF who presents today for follow-up of hypertension.   Past Medical History    Past Medical History:  Diagnosis Date   Asthma    mild, no inhaler used   Bilateral renal cysts 04/25/2019   CHF (congestive heart failure) (HCC)    DJD (degenerative joint disease)    has taken tramadol 50mg  q8h in past   Elevated LDL cholesterol level 06/19/2019   Esophageal reflux 04/23/2016   GERD (gastroesophageal reflux disease)    Hyperlipidemia    Hypertension    Morbid obesity ([place])    weighed in excess of 500 lbs at one point   OSA (obstructive sleep apnea)    did not tolerate CPAP, uses nasal cannula   Proteinuria    GFR 94.12 February 2015   Past Surgical History:  Procedure Laterality Date   COLONOSCOPY N/A 04/22/2013   Procedure: COLONOSCOPY;  Surgeon: Shann Medal, MD;  Location: [facility];  Service: General;  Laterality: N/A;   leg surgery for fracture Right 2008 or 2009   SPHINCTEROTOMY Left 04/22/2013   Procedure: Exam under anesthesia, left lateral internal SPHINCTEROTOMY;  Surgeon: Shann Medal, MD;  Location: [facility];  Service: General;  Laterality: Left;    Allergies  Allergies  Allergen Reactions   Tomato Rash    History of Present Illness    [name] is a 61 year old male with the above-mentioned past medical history who presents today for follow-up of hypertension.  [name] was initially seen by Dr. Marlou Porch in 2019 for complaint of chest pain.  He has family history of early CAD and was sent for [facility].  Myoview resulted in low risk with no ST segment deviation and small defect of moderate severity in apex location.  This was followed  up with 2D echo that revealed EF of 65-70%, no RWMA, mild LVH, with normal valve structure He was seen on 05/2020 for follow-up.  During visit patient no longer was having issues with chest pain.  He was placed on high intensity statin due to elevated LDL and family history.  He presented to the ED on 05/10/2022 with complaint of elevated blood pressures and headache.  Home systolic pressures were noted to be 2170s 180s.  CT of the head was obtained that showed no acute brain findings.  Troponins and EKG are both within normal limits.  Blood pressures improved with following dose of hydralazine and Tylenol.  Patient was seen during follow-up with blood pressures remaining elevated.  He stated that he had not taken his medications for the day.  Patient advised to take medication as soon as he returns back home.  Bystolic was increased to 20 mg and lisinopril 20 mg was added to current regimen.  Patient was also referred to hypertension clinic for assistance with hypertension management.  During his referral appointment patient was continued on irbesartan, amlodipine, carvedilol and chlorthalidone was added with Lasix decreased to 40 mg.  BMET was checked 2 weeks later and elevated at 1.46 and Lasix and potassium were both discontinued.  [name] presents today for 1 month follow-up of blood pressure with his mother.  Since last being  seen in the office patient reports that he is doing well and has no complaints of adverse reactions of his medication regimen.  His blood pressure today was well-controlled at 112/72 and heart rate was 73 bpm.  He is euvolemic on exam today and has no residual effects of Lasix discontinuation. He is abstaining from excess salt in his diet and is vigilant of his fluid intake.  During his visit we discussed activities and plan for weight loss including returning to the gym and referral to healthy weight and wellness.  Patient denies chest pain, palpitations, dyspnea, PND, orthopnea,  nausea, vomiting, dizziness, syncope, edema, weight gain, or early satiety.  Home Medications    Current Outpatient Medications  Medication Sig Dispense Refill   amLODipine (NORVASC) 10 MG tablet Take 1 tablet (10 mg total) by mouth daily. 30 tablet 2   atorvastatin (LIPITOR) 40 MG tablet TAKE 1 TABLET BY MOUTH EVERY DAY 90 tablet 1   carvedilol (COREG) 12.5 MG tablet Take 1 tablet (12.5 mg total) by mouth 2 (two) times daily with a meal. 60 tablet 2   chlorthalidone (HYGROTON) 25 MG tablet Take 1 tablet (25 mg total) by mouth daily. 45 tablet 3   irbesartan (AVAPRO) 300 MG tablet Take 1 tablet (300 mg total) by mouth daily. 30 tablet 2   lisinopril (ZESTRIL) 20 MG tablet Take 20 mg by mouth daily.     omeprazole (PRILOSEC) 20 MG capsule TAKE 1 CAPSULE BY MOUTH EVERY DAY 90 capsule 0   No current facility-administered medications for this visit.     Review of Systems  Please see the history of present illness.    (+) Shortness of breath with heavy exertion (+) Trace lower extremity edema  All other systems reviewed and are otherwise negative except as noted above.  Physical Exam    Wt Readings from Last 3 Encounters:  08/25/22 (!) 341 lb (154.7 kg)  06/25/22 (!) 344 lb (156 kg)  06/16/22 (!) 339 lb 9.6 oz (154 kg)   VS: Vitals:   08/25/22 1042  BP: 112/72  Pulse: 73  SpO2: 96%  ,Body mass index is 50.36 kg/m.  Constitutional:      Appearance: Healthy appearance. Not in distress.  Neck:     Vascular: JVD normal.  Pulmonary:     Effort: Pulmonary effort is normal.     Breath sounds: No wheezing. No rales. Diminished in the bases Cardiovascular:     Normal rate. Regular rhythm. Normal S1. Normal S2.      Murmurs: There is no murmur.  Edema:    Peripheral edema absent.  Abdominal:     Palpations: Abdomen is soft non tender. There is no hepatomegaly.  Skin:    General: Skin is warm and dry.  Neurological:     General: No focal deficit present.     Mental Status:  Alert and oriented to person, place and time.     Cranial Nerves: Cranial nerves are intact.  EKG/LABS/Other Studies Reviewed    ECG personally reviewed by me today -none completed today   Lab Results  Component Value Date   WBC 9.2 05/10/2022   HGB 15.2 05/10/2022   HCT 44.7 05/10/2022   MCV 90.3 05/10/2022   PLT 253 05/10/2022   Lab Results  Component Value Date   CREATININE 1.46 (H) 08/08/2022   BUN 20 08/08/2022   NA 139 08/08/2022   K 4.2 08/08/2022   CL 97 08/08/2022   CO2 25 08/08/2022   Lab  Results  Component Value Date   ALT 13 10/10/2021   AST 21 10/10/2021   ALKPHOS 58 10/10/2021   BILITOT 0.5 10/10/2021   Lab Results  Component Value Date   CHOL 185 10/10/2021   HDL 64 10/10/2021   LDLCALC 105 (H) 10/10/2021   TRIG 91 10/10/2021   CHOLHDL 2.9 10/10/2021    Lab Results  Component Value Date   HGBA1C 5.7 (A) 05/13/2022    Assessment & Plan    1.  Essential hypertension: -Blood pressure today was excellent at 112/72 -Continue current regimen per hypertension clinic of Norvasc 10 mg, carvedilol 12.5 mg twice daily, chlorthalidone 25 mg and Avapro 300 mg. -BMET today -Patient's fluid volume maintained with discontinuation of Lasix and potassium.  2.  Atypical chest pain: -Today patient reports no chest pain today   3.  Hyperlipidemia: -Continue Lipitor 40 mg daily   4.  Morbid obesity -Patient's current BMI is 53.22 kg/m -We discussed the importance of regular physical activity and patient is currently motivated to start a walking program.  He is interested in the PREP program at the [facility] and is considering healthy weight and wellness in the future.  Disposition: Follow-up with Candee Furbish, MD or APP in 6 months    Medication Adjustments/Labs and Tests Ordered: Current medicines are reviewed at length with the patient today.  Concerns regarding medicines are outlined above.   Signed, Mable Fill, Marissa Nestle, NP 08/25/2022, 10:53 AM [facility]  Note:  This document was prepared using Dragon voice recognition software and may include unintentional dictation errors.

## 2022-08-25 ENCOUNTER — Encounter: Payer: Self-pay | Admitting: Nurse Practitioner

## 2022-08-25 ENCOUNTER — Ambulatory Visit: Payer: Medicare HMO | Attending: Nurse Practitioner | Admitting: Nurse Practitioner

## 2022-08-25 ENCOUNTER — Ambulatory Visit: Payer: Medicare HMO

## 2022-08-25 VITALS — BP 112/72 | HR 73 | Ht 69.0 in | Wt 341.0 lb

## 2022-08-25 DIAGNOSIS — I10 Essential (primary) hypertension: Secondary | ICD-10-CM | POA: Diagnosis not present

## 2022-08-25 DIAGNOSIS — R0789 Other chest pain: Secondary | ICD-10-CM | POA: Diagnosis not present

## 2022-08-25 DIAGNOSIS — E782 Mixed hyperlipidemia: Secondary | ICD-10-CM

## 2022-08-25 LAB — BASIC METABOLIC PANEL
BUN/Creatinine Ratio: 21 (ref 10–24)
BUN: 29 mg/dL — ABNORMAL HIGH (ref 8–27)
CO2: 23 mmol/L (ref 20–29)
Calcium: 9.7 mg/dL (ref 8.6–10.2)
Chloride: 96 mmol/L (ref 96–106)
Creatinine, Ser: 1.35 mg/dL — ABNORMAL HIGH (ref 0.76–1.27)
Glucose: 94 mg/dL (ref 70–99)
Potassium: 4.3 mmol/L (ref 3.5–5.2)
Sodium: 134 mmol/L (ref 134–144)
eGFR: 60 mL/min/{1.73_m2} (ref >59)

## 2022-08-25 NOTE — Patient Instructions (Signed)
Medication Instructions:  Your physician recommends that you continue on your current medications as directed. Please refer to the Current Medication list given to you today. *If you need a refill on your cardiac medications before your next appointment, please call your pharmacy*  Lab Work: TODAY-BMET  If you have labs (blood work) drawn today and your tests are completely normal, you will receive your results only by: MyChart Message (if you have MyChart) OR A paper copy in the mail If you have any lab test that is abnormal or we need to change your treatment, we will call you to review the results.   Testing/Procedures: None Ordered  Follow-Up: At [facility], you and your health needs are our priority.  As part of our continuing mission to provide you with exceptional heart care, we have created designated Provider Care Teams.  These Care Teams include your primary Cardiologist (physician) and Advanced Practice Providers (APPs -  Physician Assistants and Nurse Practitioners) who all work together to provide you with the care you need, when you need it.  We recommend signing up for the patient portal called "MyChart".  Sign up information is provided on this After Visit Summary.  MyChart is used to connect with patients for Virtual Visits (Telemedicine).  Patients are able to view lab/test results, encounter notes, upcoming appointments, etc.  Non-urgent messages can be sent to your provider as well.   To learn more about what you can do with MyChart, go to [email or web address].    Your next appointment:   6 month(s)  The format for your next appointment:   In Person  Provider:   Donato Schultz, MD     Other Instructions   Important Information About Sugar

## 2022-08-26 ENCOUNTER — Telehealth: Payer: Self-pay | Admitting: Cardiology

## 2022-08-26 ENCOUNTER — Other Ambulatory Visit: Payer: Self-pay | Admitting: Medical

## 2022-08-26 NOTE — Telephone Encounter (Signed)
Pt c/o medication issue:  1. Name of Medication:   amLODipine (NORVASC) 10 MG tablet    2. How are you currently taking this medication (dosage and times per day)?   3. Are you having a reaction (difficulty breathing--STAT)?   4. What is your medication issue? Pt is calling to make sure decrease in medication is correct. Please advise.

## 2022-08-26 NOTE — Telephone Encounter (Signed)
Patient called to verify the dosage of his medications. He says the pharmacy gave him the wrong dose of Carvedilol. He is prescribed Carvedilol 12.5mg  BID and received Carvedilol 3.625. Reviewed medications and dosages with patient. He is going to contact his pharmacy regarding the wrong dosage being given.

## 2022-08-27 MED ORDER — CARVEDILOL 12.5 MG PO TABS: 12.5000 mg | ORAL_TABLET | Freq: Two times a day (BID) | ORAL | 3 refills | Status: DC

## 2022-08-27 MED ORDER — IRBESARTAN 300 MG PO TABS: 300.0000 mg | ORAL_TABLET | Freq: Every day | ORAL | 3 refills | Status: DC

## 2022-08-27 MED ORDER — AMLODIPINE BESYLATE 10 MG PO TABS: 10.0000 mg | ORAL_TABLET | Freq: Every day | ORAL | 3 refills | Status: DC

## 2022-08-27 NOTE — Addendum Note (Signed)
Addended by: Malena Peer D on: 08/27/2022 11:58 AM   Modules accepted: Orders

## 2022-08-27 NOTE — Telephone Encounter (Signed)
I called pt pharmacy to confirm doses. I realized lisiniopril was still on his medication list but mistake. He had been changed to irbesartan. Pharmacy said he has filled lisinopril recently. I asked them to d/c. Called pt and reviewed medications. He has been taking both lisinopril and irbesartan. Advised to stop lisinopril. Keep eye on BP and let me know if is >130/80.  This explains why his renal function had worsened some.

## 2022-09-29 ENCOUNTER — Ambulatory Visit (INDEPENDENT_AMBULATORY_CARE_PROVIDER_SITE_OTHER): Payer: Medicare HMO | Admitting: Medical

## 2022-09-29 ENCOUNTER — Encounter: Payer: Self-pay | Admitting: Medical

## 2022-09-29 VITALS — BP 120/70 | HR 73 | Ht 67.0 in | Wt 335.4 lb

## 2022-09-29 DIAGNOSIS — J4520 Mild intermittent asthma, uncomplicated: Secondary | ICD-10-CM

## 2022-09-29 DIAGNOSIS — I509 Heart failure, unspecified: Secondary | ICD-10-CM | POA: Diagnosis not present

## 2022-09-29 DIAGNOSIS — Z23 Encounter for immunization: Secondary | ICD-10-CM | POA: Diagnosis not present

## 2022-09-29 DIAGNOSIS — E782 Mixed hyperlipidemia: Secondary | ICD-10-CM | POA: Diagnosis not present

## 2022-09-29 DIAGNOSIS — G4733 Obstructive sleep apnea (adult) (pediatric): Secondary | ICD-10-CM

## 2022-09-29 DIAGNOSIS — I110 Hypertensive heart disease with heart failure: Secondary | ICD-10-CM | POA: Diagnosis not present

## 2022-09-29 DIAGNOSIS — Z7185 Encounter for immunization safety counseling: Secondary | ICD-10-CM

## 2022-09-29 DIAGNOSIS — K219 Gastro-esophageal reflux disease without esophagitis: Secondary | ICD-10-CM | POA: Diagnosis not present

## 2022-09-29 DIAGNOSIS — Z125 Encounter for screening for malignant neoplasm of prostate: Secondary | ICD-10-CM

## 2022-09-29 DIAGNOSIS — R7301 Impaired fasting glucose: Secondary | ICD-10-CM

## 2022-09-29 DIAGNOSIS — Z8701 Personal history of pneumonia (recurrent): Secondary | ICD-10-CM

## 2022-09-29 DIAGNOSIS — Z6841 Body Mass Index (BMI) 40.0 and over, adult: Secondary | ICD-10-CM

## 2022-09-29 DIAGNOSIS — N281 Cyst of kidney, acquired: Secondary | ICD-10-CM | POA: Diagnosis not present

## 2022-09-29 DIAGNOSIS — Z1211 Encounter for screening for malignant neoplasm of colon: Secondary | ICD-10-CM

## 2022-09-29 DIAGNOSIS — Z0000 Encounter for general adult medical examination without abnormal findings: Secondary | ICD-10-CM | POA: Diagnosis not present

## 2022-09-29 DIAGNOSIS — I10 Essential (primary) hypertension: Secondary | ICD-10-CM | POA: Diagnosis not present

## 2022-09-29 DIAGNOSIS — Z8249 Family history of ischemic heart disease and other diseases of the circulatory system: Secondary | ICD-10-CM

## 2022-09-29 DIAGNOSIS — Z87438 Personal history of other diseases of male genital organs: Secondary | ICD-10-CM

## 2022-09-29 LAB — POCT URINALYSIS DIP (PROADVANTAGE DEVICE)
Bilirubin, UA: NEGATIVE
Blood, UA: NEGATIVE
Glucose, UA: NEGATIVE mg/dL
Ketones, POC UA: NEGATIVE mg/dL
Leukocytes, UA: NEGATIVE
Nitrite, UA: NEGATIVE
Protein Ur, POC: NEGATIVE mg/dL
Specific Gravity, Urine: 1.015
Urobilinogen, Ur: NEGATIVE
pH, UA: 7 (ref 5.0–8.0)

## 2022-09-29 NOTE — Progress Notes (Signed)
Subjective:   HPI  [name] is a 62 y.o. male who presents for Chief Complaint  Patient presents with   fasting cpe    Fasting cpe with AWV   Medical team: Dr. Donato Schultz, Robin Searing, NP, cardiology Eye doctor Dentist Dr. Annell Greening, ortho Dr. Corliss Parish, GI Viktoriya Glaspy, Kermit Balo, PA-C here for primary care  Concerns: Here for well visit.  Accompanied by his mother.   Here for well visit.  Saw cardiology recently.  Not currently using CPAP.  He is on disability for heart disease.  Compliant with medications.  Reviewed their medical, surgical, family, social, medication, and allergy history and updated chart as appropriate.  Past Medical History:  Diagnosis Date   Asthma    mild, no inhaler used   Bilateral renal cysts 04/25/2019   CHF (congestive heart failure) (HCC)    DJD (degenerative joint disease)    has taken tramadol 50mg  q8h in past   Elevated LDL cholesterol level 06/19/2019   Esophageal reflux 04/23/2016   GERD (gastroesophageal reflux disease)    Hyperlipidemia    Hypertension    Morbid obesity (HCC)    weighed in excess of 500 lbs at one point   OSA (obstructive sleep apnea)    did not tolerate CPAP, uses nasal cannula   Proteinuria    GFR 94.12 February 2015    Past Surgical History:  Procedure Laterality Date   COLONOSCOPY N/A 04/22/2013   Procedure: COLONOSCOPY;  Surgeon: 04/24/2013, MD;  Location: [facility];  Service: General;  Laterality: N/A;   leg surgery for fracture Right 2008 or 2009   SPHINCTEROTOMY Left 04/22/2013   Procedure: Exam under anesthesia, left lateral internal SPHINCTEROTOMY;  Surgeon: 04/24/2013, MD;  Location: [facility];  Service: General;  Laterality: Left;    Family History  Problem Relation Age of Onset   Heart disease Father        MI, DM2, HTN   Hypertension Mother        56 DM2, HTN   Cancer Mother    Diabetes Brother    Stroke Brother    Colon cancer Neg Hx    Esophageal cancer Neg Hx     Inflammatory bowel disease Neg Hx    Liver disease Neg Hx    Pancreatic cancer Neg Hx    Rectal cancer Neg Hx    Stomach cancer Neg Hx      Current Outpatient Medications:    amLODipine (NORVASC) 10 MG tablet, Take 1 tablet (10 mg total) by mouth daily., Disp: 90 tablet, Rfl: 3   atorvastatin (LIPITOR) 40 MG tablet, TAKE 1 TABLET BY MOUTH EVERY DAY, Disp: 90 tablet, Rfl: 1   carvedilol (COREG) 12.5 MG tablet, Take 1 tablet (12.5 mg total) by mouth 2 (two) times daily with a meal., Disp: 180 tablet, Rfl: 3   chlorthalidone (HYGROTON) 25 MG tablet, Take 1 tablet (25 mg total) by mouth daily., Disp: 45 tablet, Rfl: 3   irbesartan (AVAPRO) 300 MG tablet, Take 1 tablet (300 mg total) by mouth daily., Disp: 90 tablet, Rfl: 3   omeprazole (PRILOSEC) 20 MG capsule, TAKE 1 CAPSULE BY MOUTH EVERY DAY, Disp: 90 capsule, Rfl: 0  Allergies  Allergen Reactions   Tomato Rash   Review of Systems Constitutional: -fever, -chills, -sweats, -unexpected weight change, -decreased appetite, -fatigue Allergy: -sneezing, -itching, -congestion Dermatology: -changing moles, --rash, -lumps ENT: -runny nose, -ear pain, -sore throat, -hoarseness, -sinus pain, -teeth pain, - ringing in  ears, -hearing loss, -nosebleeds Cardiology: -chest pain, -palpitations, -swelling, -difficulty breathing when lying flat, -waking up short of breath Respiratory: -cough, -shortness of breath, -difficulty breathing with exercise or exertion, -wheezing, -coughing up blood Gastroenterology: -abdominal pain, -nausea, -vomiting, -diarrhea, -constipation, -blood in stool, -changes in bowel movement, -difficulty swallowing or eating Hematology: -bleeding, -bruising  Musculoskeletal: -joint aches, -muscle aches, -joint swelling, -back pain, -neck pain, -cramping, -changes in gait Ophthalmology: denies vision changes, eye redness, itching, discharge Urology: -burning with urination, -difficulty urinating, -blood in urine, -urinary frequency,  -urgency, -incontinence Neurology: -headache, -weakness, -tingling, -numbness, -memory loss, -falls, -dizziness Psychology: -depressed mood, -agitation, -sleep problems Male GU: no testicular mass, pain, no lymph nodes swollen, no swelling, no rash.     09/29/2022    9:30 AM 05/13/2022    3:54 PM 09/27/2021   11:19 AM 01/07/2021    2:10 PM 06/28/2020    8:47 AM  Depression screen PHQ 2/9  Decreased Interest 0 0 0 0 0  Down, Depressed, Hopeless 0 0 0 0 0  PHQ - 2 Score 0 0 0 0 0        Objective:  BP 120/70   Pulse 73   Ht 5\' 7"  (1.702 m)   Wt (!) 335 lb 6.4 oz (152.1 kg)   SpO2 97%   BMI 52.53 kg/m   BP Readings from Last 3 Encounters:  09/29/22 120/70  08/25/22 112/72  08/08/22 102/82   Wt Readings from Last 3 Encounters:  09/29/22 (!) 335 lb 6.4 oz (152.1 kg)  08/25/22 (!) 341 lb (154.7 kg)  06/25/22 (!) 344 lb (156 kg)    General appearance: alert, no distress, WD/WN, African American male Skin: scattered macules, no worrisome lesions HEENT: normocephalic, conjunctiva/corneas normal, sclerae anicteric, PERRLA, EOMi, nares patent, no discharge or erythema, pharynx normal Oral cavity: MMM, tongue normal, teeth normal Neck: supple, no lymphadenopathy, no thyromegaly, no masses, normal ROM, no bruits Chest: non tender, normal shape and expansion Heart: RRR, normal S1, S2, no murmurs Lungs: CTA bilaterally, no wheezes, rhonchi, or rales Abdomen: +bs, soft, non tender, non distended, no masses, no hepatomegaly, no splenomegaly, no bruits Back: non tender, normal ROM, no scoliosis Musculoskeletal: upper extremities non tender, no obvious deformity, normal ROM throughout, lower extremities non tender, no obvious deformity, normal ROM throughout Extremities: no edema, no cyanosis, no clubbing Pulses: 2+ symmetric, upper and lower extremities, normal cap refill Neurological: alert, oriented x 3, CN2-12 intact, strength normal upper extremities and lower extremities, sensation  normal throughout, DTRs 2+ throughout, no cerebellar signs, gait normal Psychiatric: normal affect, behavior normal, pleasant  GU/rectal - deferred    Assessment and Plan :   Encounter Diagnoses  Name Primary?   Encounter for health maintenance examination in adult Yes   Encounter for annual wellness visit (AWV) in Medicare patient    Primary hypertension    Mild intermittent asthma without complication    OSA (obstructive sleep apnea)    GERD without esophagitis    Bilateral renal cysts    Vaccine counseling    Mixed hyperlipidemia    History of pneumonia    History of erectile dysfunction    Family history of heart disease in male family member before age 66    Impaired fasting blood sugar    Screening for prostate cancer    Screen for colon cancer    COVID-19 vaccine administered    BMI 50.0-59.9, adult ([facility])     This visit was a preventative care visit, also known as  wellness visit or routine physical.   Topics typically include healthy lifestyle, diet, exercise, preventative care, vaccinations, sick and well care, proper use of emergency dept and after hours care, as well as other concerns.     Recommendations: Continue to return yearly for your annual wellness and preventative care visits.  This gives [place] a chance to discuss healthy lifestyle, exercise, vaccinations, review your chart record, and perform screenings where appropriate.  I recommend you see your eye doctor yearly for routine vision care.  I recommend you see your dentist yearly for routine dental care including hygiene visits twice yearly.   Vaccination recommendations were reviewed Immunization History  Administered Date(s) Administered   COVID-19, mRNA, vaccine(Comirnaty)12 years and older 09/29/2022   Influenza-Unspecified 07/01/2011, 07/19/2012   PFIZER Comirnaty(Gray Top)Covid-19 Tri-Sucrose Vaccine 11/21/2020   PFIZER(Purple Top)SARS-COV-2 Vaccination 03/27/2020, 04/17/2020   Pneumococcal  Polysaccharide-23 11/03/2011   Td 11/03/2011   Tdap 02/16/2014   I recommend shingrix, pneumococcal 13, and influenza vaccines.   Counseled on the Covid virus vaccine.  Vaccine information sheet given.  Covid vaccine given after consent obtained.  Return soon at your convenience for flu shot, Pneumococcal 13 vaccine and get your Shingrix vaccine at the pharmacy   Screening for cancer: Colon cancer screening: We will refer you for screening colonoscopy  We discussed PSA, prostate exam, and prostate cancer screening risks/benefits.     Skin cancer screening: Check your skin regularly for new changes, growing lesions, or other lesions of concern Come in for evaluation if you have skin lesions of concern.  Lung cancer screening: If you have a greater than 20 pack year history of tobacco use, then you may qualify for lung cancer screening with a chest CT scan.   Please call your insurance company to inquire about coverage for this test.  We currently don't have screenings for other cancers besides breast, cervical, colon, and lung cancers.  If you have a strong family history of cancer or have other cancer screening concerns, please let me know.    Bone health: Get at least 150 minutes of aerobic exercise weekly Get weight bearing exercise at least once weekly Bone density test:  A bone density test is an imaging test that uses a type of X-ray to measure the amount of calcium and other minerals in your bones. The test may be used to diagnose or screen you for a condition that causes weak or thin bones (osteoporosis), predict your risk for a broken bone (fracture), or determine how well your osteoporosis treatment is working. The bone density test is recommended for females 45 and older, or females or males <67 if certain risk factors such as thyroid disease, long term use of steroids such as for asthma or rheumatological issues, vitamin D deficiency, estrogen deficiency, family history of  osteoporosis, self or family history of fragility fracture in first degree relative.    Heart health: Get at least 150 minutes of aerobic exercise weekly Limit alcohol It is important to maintain a healthy blood pressure and healthy cholesterol numbers  Heart disease screening: Screening for heart disease includes screening for blood pressure, fasting lipids, glucose/diabetes screening, BMI height to weight ratio, reviewed of smoking status, physical activity, and diet.    Goals include blood pressure 120/80 or less, maintaining a healthy lipid/cholesterol profile, preventing diabetes or keeping diabetes numbers under good control, not smoking or using tobacco products, exercising most days per week or at least 150 minutes per week of exercise, and eating healthy variety of  fruits and vegetables, healthy oils, and avoiding unhealthy food choices like fried food, fast food, high sugar and high cholesterol foods.    Continue routine follow up with cardiology   Medical care options: I recommend you continue to seek care here first for routine care.  We try really hard to have available appointments Monday through Friday daytime hours for sick visits, acute visits, and physicals.  Urgent care should be used for after hours and weekends for significant issues that cannot wait till the next day.  The emergency department should be used for significant potentially life-threatening emergencies.  The emergency department is expensive, can often have long wait times for less significant concerns, so try to utilize primary care, urgent care, or telemedicine when possible to avoid unnecessary trips to the emergency department.  Virtual visits and telemedicine have been introduced since the pandemic started in 2020, and can be convenient ways to receive medical care.  We offer virtual appointments as well to assist you in a variety of options to seek medical care.   Advanced Directives: I recommend you  consider completing a [place] and Living Will.   These documents respect your wishes and help alleviate burdens on your loved ones if you were to become terminally ill or be in a position to need those documents enforced.    You can complete Advanced Directives yourself, have them notarized, then have copies made for our office, for you and for anybody you feel should have them in safe keeping.  Or, you can have an attorney prepare these documents.   If you haven't updated your Last Will and Testament in a while, it may be worthwhile having an attorney prepare these documents together and save on some costs.       Separate significant issues discussed: Hypertension history of heart failure-at goal, compliant with medication, sees cardiology  Hyperlipidemia-updated labs today, compliant with statin  OSA - doesn't tolerate CPAP  Asthma - no recent problems in last few years.  Declines inhaler currently.  Obesity-continue efforts to work on getting some exercise and eating healthy and trying to lose weight  Impaired glucose-labs today  [name] was seen today for fasting cpe.  Diagnoses and all orders for this visit:  Encounter for health maintenance examination in adult -     Hemoglobin A1c -     PSA -     CBC -     Lipid panel -     Ambulatory referral to Gastroenterology -     Hepatic function panel -     POCT Urinalysis DIP (Proadvantage Device)  Encounter for annual wellness visit (AWV) in Medicare patient  Primary hypertension  Mild intermittent asthma without complication  OSA (obstructive sleep apnea)  GERD without esophagitis  Bilateral renal cysts  Vaccine counseling  Mixed hyperlipidemia -     Lipid panel  History of pneumonia  History of erectile dysfunction  Family history of heart disease in male family member before age 74  Impaired fasting blood sugar -     Hemoglobin A1c  Screening for prostate cancer -     PSA  Screen for  colon cancer -     Ambulatory referral to Gastroenterology  COVID-19 vaccine administered -     Pfizer Fall 2023 Covid-19 Vaccine 95yrs and older  BMI 50.0-59.9, adult ([place])   Follow-up pending labs, yearly for physical

## 2022-09-29 NOTE — Patient Instructions (Signed)
Recommendations: Continue to return yearly for your annual wellness and preventative care visits.  This gives [place] a chance to discuss healthy lifestyle, exercise, vaccinations, review your chart record, and perform screenings where appropriate.  I recommend you see your eye doctor yearly for routine vision care.  I recommend you see your dentist yearly for routine dental care including hygiene visits twice yearly.   Vaccination recommendations were reviewed Immunization History  Administered Date(s) Administered   Influenza-Unspecified 07/01/2011, 07/19/2012   PFIZER Comirnaty(Gray Top)Covid-19 Tri-Sucrose Vaccine 11/21/2020   PFIZER(Purple Top)SARS-COV-2 Vaccination 03/27/2020, 04/17/2020   Pneumococcal Polysaccharide-23 11/03/2011   Td 11/03/2011   Tdap 02/16/2014   I recommend shingrix, pneumococcal 13, and influenza vaccines.   Counseled on the Covid virus vaccine.  Vaccine information sheet given.  Covid vaccine given after consent obtained.  Return soon at your convenience for flu shot, Pneumococcal 13 vaccine and get your Shingrix vaccine at the pharmacy   Screening for cancer: Colon cancer screening: We will refer you for screening colonoscopy  We discussed PSA, prostate exam, and prostate cancer screening risks/benefits.     Skin cancer screening: Check your skin regularly for new changes, growing lesions, or other lesions of concern Come in for evaluation if you have skin lesions of concern.  Lung cancer screening: If you have a greater than 20 pack year history of tobacco use, then you may qualify for lung cancer screening with a chest CT scan.   Please call your insurance company to inquire about coverage for this test.  We currently don't have screenings for other cancers besides breast, cervical, colon, and lung cancers.  If you have a strong family history of cancer or have other cancer screening concerns, please let me know.    Bone health: Get at least 150  minutes of aerobic exercise weekly Get weight bearing exercise at least once weekly Bone density test:  A bone density test is an imaging test that uses a type of X-ray to measure the amount of calcium and other minerals in your bones. The test may be used to diagnose or screen you for a condition that causes weak or thin bones (osteoporosis), predict your risk for a broken bone (fracture), or determine how well your osteoporosis treatment is working. The bone density test is recommended for females 15 and older, or females or males <38 if certain risk factors such as thyroid disease, long term use of steroids such as for asthma or rheumatological issues, vitamin D deficiency, estrogen deficiency, family history of osteoporosis, self or family history of fragility fracture in first degree relative.    Heart health: Get at least 150 minutes of aerobic exercise weekly Limit alcohol It is important to maintain a healthy blood pressure and healthy cholesterol numbers  Heart disease screening: Screening for heart disease includes screening for blood pressure, fasting lipids, glucose/diabetes screening, BMI height to weight ratio, reviewed of smoking status, physical activity, and diet.    Goals include blood pressure 120/80 or less, maintaining a healthy lipid/cholesterol profile, preventing diabetes or keeping diabetes numbers under good control, not smoking or using tobacco products, exercising most days per week or at least 150 minutes per week of exercise, and eating healthy variety of fruits and vegetables, healthy oils, and avoiding unhealthy food choices like fried food, fast food, high sugar and high cholesterol foods.    Continue routine follow up with cardiology   Medical care options: I recommend you continue to seek care here first for routine care.  We  try really hard to have available appointments Monday through Friday daytime hours for sick visits, acute visits, and physicals.   Urgent care should be used for after hours and weekends for significant issues that cannot wait till the next day.  The emergency department should be used for significant potentially life-threatening emergencies.  The emergency department is expensive, can often have long wait times for less significant concerns, so try to utilize primary care, urgent care, or telemedicine when possible to avoid unnecessary trips to the emergency department.  Virtual visits and telemedicine have been introduced since the pandemic started in 2020, and can be convenient ways to receive medical care.  We offer virtual appointments as well to assist you in a variety of options to seek medical care.   Advanced Directives: I recommend you consider completing a [place] and Living Will.   These documents respect your wishes and help alleviate burdens on your loved ones if you were to become terminally ill or be in a position to need those documents enforced.    You can complete Advanced Directives yourself, have them notarized, then have copies made for our office, for you and for anybody you feel should have them in safe keeping.  Or, you can have an attorney prepare these documents.   If you haven't updated your Last Will and Testament in a while, it may be worthwhile having an attorney prepare these documents together and save on some costs.       Separate significant issues discussed: Hypertension history of heart failure-at goal, compliant with medication, sees cardiology  Hyperlipidemia-updated labs today, compliant with statin  OSA - doesn't tolerate CPAP  Asthma - no recent problems in last few years.  Declines inhaler currently.  Obesity-continue efforts to work on getting some exercise and eating healthy and trying to lose weight  Impaired glucose-labs today

## 2022-09-30 ENCOUNTER — Encounter: Payer: Self-pay | Admitting: Internal Medicine

## 2022-09-30 LAB — LIPID PANEL
Chol/HDL Ratio: 4.2 ratio (ref 0.0–5.0)
Cholesterol, Total: 199 mg/dL (ref 100–199)
HDL: 47 mg/dL (ref >39)
LDL Chol Calc (NIH): 132 mg/dL — ABNORMAL HIGH (ref 0–99)
Triglycerides: 111 mg/dL (ref 0–149)
VLDL Cholesterol Cal: 20 mg/dL (ref 5–40)

## 2022-09-30 LAB — HEPATIC FUNCTION PANEL
ALT: 25 IU/L (ref 0–44)
AST: 22 IU/L (ref 0–40)
Albumin: 4 g/dL (ref 3.9–4.9)
Alkaline Phosphatase: 62 IU/L (ref 44–121)
Bilirubin Total: 0.4 mg/dL (ref 0.0–1.2)
Bilirubin, Direct: 0.11 mg/dL (ref 0.00–0.40)
Total Protein: 6.7 g/dL (ref 6.0–8.5)

## 2022-09-30 LAB — CBC
Hematocrit: 42.3 % (ref 37.5–51.0)
Hemoglobin: 14 g/dL (ref 13.0–17.7)
MCH: 30.6 pg (ref 26.6–33.0)
MCHC: 33.1 g/dL (ref 31.5–35.7)
MCV: 93 fL (ref 79–97)
Platelets: 264 10*3/uL (ref 150–450)
RBC: 4.57 x10E6/uL (ref 4.14–5.80)
RDW: 14.5 % (ref 11.6–15.4)
WBC: 7.4 10*3/uL (ref 3.4–10.8)

## 2022-09-30 LAB — HEMOGLOBIN A1C
Est. average glucose Bld gHb Est-mCnc: 128 mg/dL
Hgb A1c MFr Bld: 6.1 % — ABNORMAL HIGH (ref 4.8–5.6)

## 2022-09-30 LAB — PSA: Prostate Specific Ag, Serum: 0.7 ng/mL (ref 0.0–4.0)

## 2022-09-30 NOTE — Progress Notes (Signed)
Diabetes marker 6.1% at risk for diabetes, cholesterol numbers need to be a little bit better, prostate marker is normal, liver tests are okay, blood counts okay.  I you taking the Lipitor every single day?  If so, although the Lipitor is helping, you are not quite to goal.  Options could include increasing the dose of Lipitor even more, or continuing Lipitor and adding a injectable medicine every 2 weeks such as Repatha or Praluent to further lower your LDL cholesterol (see what he wants to do in this regard)  Continue other medicines as usual, work on efforts to lose weight through healthy diet and exercise.  I recommend you check with your insurance to see if any of the following medications are covered which could help with weight loss - Wegovy, Zepbound.  Plan a tentative 19-month fasting follow-up  Prediabetes means you have a higher than normal blood sugar level. It's not high enough to be considered type 2 diabetes yet, but without making some lifestyle changes you are more likely to develop type 2 diabetes.  If you have prediabetes, the long-term damage of diabetes, especially to your heart, blood vessels and kidneys may already be starting. You may not be able to change certain risk factors such as age, race, or family history, but you CAN make changes to your lifestyle, your eating habits, and your activity.  Although diabetes can develop at any age, the risk of prediabetes increases after age 73.  Your risk of prediabetes increases if you have a parent or sibling with type 2 diabetes.   Although it's unclear why, certain people including Black, Hispanic, American [place] American people, are more likely to develop prediabetes.  Ways to prevent or slow progression to diabetes: Eat healthy foods - Eating red meat and processed meat, and drinking sugar-sweetened beverages, is associated with a higher risk of prediabetes. A diet high in fruits, vegetables, nuts, whole grains and olive oil  is associated with a lower risk of prediabetes. Get at least 150 minutes of moderate aerobic physical activity a week, or about 30 minutes on most days of the week.  The less active you are, the greater your risk of prediabetes. Physical activity helps you control your weight, uses up sugar for energy and makes the body use insulin more effectively. Lose excess weight - Being overweight is a primary risk factor for prediabetes. The more fatty tissue you have, especially inside and between the muscle and skin around your abdomen, the more resistant your cells become to insulin. Waist size. A large waist size can indicate insulin resistance. The risk of insulin resistance goes up for men with waists larger than 40 inches and for women with waists larger than 35 inches. Control your blood pressure and cholesterol.  If your blood pressure is not 130/80 or less, discuss with your provider to help get this under control.  It is ideal to have an HDL good cholesterol number >50 and have a LDL bad cholesterol number <100.   Don't smoke One simple strategy to help you make good food choices and eat appropriate portions sizes is to divide up your plate. These three divisions on your plate promote healthy eating:  One-half: fruit and nonstarchy vegetables One-quarter: whole grains One-quarter: protein-rich foods, such as legumes, fish or lean meats

## 2022-10-01 ENCOUNTER — Other Ambulatory Visit: Payer: Self-pay | Admitting: Medical

## 2022-10-01 MED ORDER — ATORVASTATIN CALCIUM 80 MG PO TABS: 80.0000 mg | ORAL_TABLET | Freq: Every day | ORAL | 1 refills | Status: DC

## 2022-10-06 ENCOUNTER — Other Ambulatory Visit: Payer: Self-pay

## 2022-10-06 ENCOUNTER — Emergency Department (HOSPITAL_COMMUNITY)
Admission: EM | Admit: 2022-10-06 | Discharge: 2022-10-07 | Disposition: A | Discharge status: Home / Self Care | Payer: Medicare HMO | Attending: Emergency Medicine | Admitting: Emergency Medicine

## 2022-10-06 ENCOUNTER — Emergency Department (HOSPITAL_COMMUNITY): Payer: Medicare HMO

## 2022-10-06 DIAGNOSIS — Z79899 Other long term (current) drug therapy: Secondary | ICD-10-CM | POA: Insufficient documentation

## 2022-10-06 DIAGNOSIS — I10 Essential (primary) hypertension: Secondary | ICD-10-CM | POA: Diagnosis not present

## 2022-10-06 DIAGNOSIS — R030 Elevated blood-pressure reading, without diagnosis of hypertension: Secondary | ICD-10-CM

## 2022-10-06 DIAGNOSIS — R0602 Shortness of breath: Secondary | ICD-10-CM | POA: Diagnosis not present

## 2022-10-06 LAB — CBC WITH DIFFERENTIAL/PLATELET
Abs Immature Granulocytes: 0.03 10*3/uL (ref 0.00–0.07)
Basophils Absolute: 0.1 10*3/uL (ref 0.0–0.1)
Basophils Relative: 1 %
Eosinophils Absolute: 0.6 10*3/uL — ABNORMAL HIGH (ref 0.0–0.5)
Eosinophils Relative: 6 %
HCT: 41.4 % (ref 39.0–52.0)
Hemoglobin: 13.8 g/dL (ref 13.0–17.0)
Immature Granulocytes: 0 %
Lymphocytes Relative: 10 %
Lymphs Abs: 1 10*3/uL (ref 0.7–4.0)
MCH: 30.9 pg (ref 26.0–34.0)
MCHC: 33.3 g/dL (ref 30.0–36.0)
MCV: 92.6 fL (ref 80.0–100.0)
Monocytes Absolute: 0.5 10*3/uL (ref 0.1–1.0)
Monocytes Relative: 5 %
Neutro Abs: 7.9 10*3/uL — ABNORMAL HIGH (ref 1.7–7.7)
Neutrophils Relative %: 78 %
Platelets: 296 10*3/uL (ref 150–400)
RBC: 4.47 MIL/uL (ref 4.22–5.81)
RDW: 15 % (ref 11.5–15.5)
WBC: 10.1 10*3/uL (ref 4.0–10.5)
nRBC: 0 % (ref 0.0–0.2)

## 2022-10-06 LAB — COMPREHENSIVE METABOLIC PANEL
ALT: 27 U/L (ref 0–44)
AST: 32 U/L (ref 15–41)
Albumin: 3.6 g/dL (ref 3.5–5.0)
Alkaline Phosphatase: 55 U/L (ref 38–126)
Anion gap: 9 (ref 5–15)
BUN: 19 mg/dL (ref 8–23)
CO2: 25 mmol/L (ref 22–32)
Calcium: 9.3 mg/dL (ref 8.9–10.3)
Chloride: 98 mmol/L (ref 98–111)
Creatinine, Ser: 1.23 mg/dL (ref 0.61–1.24)
GFR, Estimated: >60 mL/min (ref >60)
Glucose, Bld: 126 mg/dL — ABNORMAL HIGH (ref 70–99)
Potassium: 3.6 mmol/L (ref 3.5–5.1)
Sodium: 132 mmol/L — ABNORMAL LOW (ref 135–145)
Total Bilirubin: 0.5 mg/dL (ref 0.3–1.2)
Total Protein: 7 g/dL (ref 6.5–8.1)

## 2022-10-06 NOTE — ED Provider Triage Note (Addendum)
Emergency Medicine Provider Triage Evaluation Note  [name] , a 62 y.o. male  was evaluated in triage.  Pt complains of hypertension and tachycardia.  Checked his vitals at home, noticed his blood pressure was around 170/110, with heart rate around 97 bpm.  States this is little bit above his normal.  Also had some palpitations, unsure of shortness of breath.  Denies chest pain, nausea, vomiting, syncope.  Also with a few seconds of dizziness that dissipated quickly and on own, none since.  Reports taking medications as directed.  Review of Systems  Positive:  Negative: See above  Physical Exam  BP (!) 153/93 (BP Location: Left Arm)   Pulse 87   Temp 99.1 F (37.3 C)   Resp 20   SpO2 98%  Gen:   Awake, no distress   Resp:  Normal effort  MSK:   Moves extremities without difficulty  Other:  Chest non-TTP.  Abdomen soft, protuberant, non-TTP.  Not diaphoretic.  No truncal deviation.  Gaze aligned appropriately.  EOMI to providers face.  Strength, coordination appears grossly intact.  Medical Decision Making  Medically screening exam initiated at 8:19 PM.  Appropriate orders placed.  [name] was informed that the remainder of the evaluation will be completed by another provider, this initial triage assessment does not replace that evaluation, and the importance of remaining in the ED until their evaluation is complete.   Hx of OSA, morbid obesity, CHF, asthma, GERD, HTN.   Prince Rome, PA-C 16/10/96 2022

## 2022-10-06 NOTE — ED Triage Notes (Signed)
Patient reports palpitations with hypertension BP= 176/112 this evening with mild SOB , no emesis or diaphoresis .

## 2022-10-07 MED ORDER — ALUM & MAG HYDROXIDE-SIMETH 200-200-20 MG/5ML PO SUSP
30.0000 mL | Freq: Once | ORAL | Status: AC
  Administered 2022-10-07: 30 mL via ORAL
  Filled 2022-10-07: qty 30

## 2022-10-07 MED ORDER — LIDOCAINE VISCOUS HCL 2 % MT SOLN
15.0000 mL | Freq: Once | OROMUCOSAL | Status: AC
  Administered 2022-10-07: 15 mL via ORAL
  Filled 2022-10-07: qty 15

## 2022-10-07 NOTE — ED Provider Notes (Signed)
[facility] Provider Note   CSN: [id number] Arrival date & time: 10/06/22  7371     History  Chief Complaint  Patient presents with   Palpitations    Hypertension    [name] is a 62 y.o. male.  62 y.o male with a PMH of hypertension compliant with meds patient presents to the ED with a chief complaint of hypertension with tachycardia.  Patient reports around 10 AM yesterday he woke up shaking his blood pressure and it was 120/80, later he reports eating and taking his blood pressure medication and noticed his blood pressure to be elevated at 170/110, states that his heart rate was in the 90s which is above his baseline.  He does endorse some epigastric pain, reports he has history of reflux and takes daily medication for this.  He did eat hot flaming Cheetos the night before which she reports are likely exacerbating his symptoms.   Prior ECHO on 2019 : LV EF: 65% -   70%  The history is provided by the patient and medical records.  Hypertension This is a recurrent problem. Pertinent negatives include no chest pain, no abdominal pain and no shortness of breath.       Home Medications Prior to Admission medications   Medication Sig Start Date End Date Taking? Authorizing Provider  atorvastatin (LIPITOR) 80 MG tablet Take 1 tablet (80 mg total) by mouth daily. 10/01/22 10/01/23  Tysinger, Camelia Eng, PA-C  amLODipine (NORVASC) 10 MG tablet Take 1 tablet (10 mg total) by mouth daily. 08/27/22   Jerline Pain, MD  carvedilol (COREG) 12.5 MG tablet Take 1 tablet (12.5 mg total) by mouth 2 (two) times daily with a meal. 08/27/22   Jerline Pain, MD  chlorthalidone (HYGROTON) 25 MG tablet Take 1 tablet (25 mg total) by mouth daily. 07/01/22   Jerline Pain, MD  irbesartan (AVAPRO) 300 MG tablet Take 1 tablet (300 mg total) by mouth daily. 08/27/22   Jerline Pain, MD  omeprazole (PRILOSEC) 20 MG capsule TAKE 1 CAPSULE BY MOUTH EVERY DAY  08/14/22   Tysinger, Camelia Eng, PA-C      Allergies    Tomato    Review of Systems   Review of Systems  Constitutional:  Negative for chills and fever.  HENT:  Negative for sore throat.   Respiratory:  Negative for shortness of breath.   Cardiovascular:  Negative for chest pain.  Gastrointestinal:  Negative for abdominal pain, diarrhea, nausea and vomiting.  Genitourinary:  Negative for flank pain.  Musculoskeletal:  Negative for back pain.  Neurological:  Negative for dizziness and light-headedness.  All other systems reviewed and are negative.   Physical Exam Updated Vital Signs BP (!) 139/98   Pulse 84   Temp 98 F (36.7 C) (Oral)   Resp 16   SpO2 100%  Physical Exam Vitals and nursing note reviewed.  Constitutional:      Appearance: Normal appearance.  HENT:     Head: Normocephalic and atraumatic.     Mouth/Throat:     Mouth: Mucous membranes are moist.  Eyes:     Pupils: Pupils are equal, round, and reactive to light.  Cardiovascular:     Rate and Rhythm: Normal rate.  Pulmonary:     Effort: Pulmonary effort is normal.     Breath sounds: No wheezing.  Abdominal:     General: Abdomen is flat.     Palpations: Abdomen is soft.  Tenderness: There is no abdominal tenderness.  Musculoskeletal:     Cervical back: Normal range of motion and neck supple.  Skin:    General: Skin is warm and dry.  Neurological:     Mental Status: He is alert and oriented to person, place, and time.     ED Results / Procedures / Treatments   Labs (all labs ordered are listed, but only abnormal results are displayed) Labs Reviewed  COMPREHENSIVE METABOLIC PANEL - Abnormal; Notable for the following components:      Result Value   Sodium 132 (*)    Glucose, Bld 126 (*)    All other components within normal limits  CBC WITH DIFFERENTIAL/PLATELET - Abnormal; Notable for the following components:   Neutro Abs 7.9 (*)    Eosinophils Absolute 0.6 (*)    All other components within  normal limits  URINALYSIS, ROUTINE W REFLEX MICROSCOPIC    EKG EKG Interpretation  Date/Time:  Monday October 06 2022 20:15:40 EST Ventricular Rate:  87 PR Interval:  194 QRS Duration: 98 QT Interval:  372 QTC Calculation: 447 R Axis:   -36 Text Interpretation: Sinus rhythm with Premature atrial complexes Left axis deviation Abnormal ECG When compared with ECG of 10-May-2022 14:54, PREVIOUS ECG IS PRESENT Confirmed by Pattricia Boss [phone]) on 10/07/2022 10:31:16 AM  Radiology DG Chest 2 View  Result Date: 10/06/2022 CLINICAL DATA:  Shortness of breath and hypertension EXAM: CHEST - 2 VIEW COMPARISON:  05/10/2022 FINDINGS: The heart size and mediastinal contours are within normal limits. Both lungs are clear. The visualized skeletal structures are unremarkable. Aortic atherosclerotic calcification. IMPRESSION: No active cardiopulmonary disease. Electronically Signed   By: Placido Sou M.D.   On: 10/06/2022 21:14    Procedures Procedures    Medications Ordered in ED Medications  alum & mag hydroxide-simeth (MAALOX/MYLANTA) 200-200-20 MG/5ML suspension 30 mL (30 mLs Oral Given 10/07/22 1015)    And  lidocaine (XYLOCAINE) 2 % viscous mouth solution 15 mL (15 mLs Oral Given 10/07/22 1015)    ED Course/ Medical Decision Making/ A&P                             Medical Decision Making Risk OTC drugs. Prescription drug management.   This patient presents to the ED for concern of elevated blood pressure, this involves a number of treatment options, and is a complaint that carries with it a high risk of complications and morbidity.  The differential diagnosis includes hypertensive urgency versus hypertensive and emergency.   Co morbidities: Discussed in HPI   Brief History:  Patient here with 1 blood pressure elevated reading yesterday around 10 AM, also reports noticing palpitations no associated chest pain or shortness of breath.  No prior cardiac history.  Does have a  family history of CAD.  He does have close follow-up with primary care physician and is borderline diabetic currently not taking any medication for glucose control.  EMR reviewed including pt PMHx, past surgical history and past visits to ER.   See HPI for more details   Lab Tests:  I ordered and independently interpreted labs.  The pertinent results include:    I personally reviewed all laboratory work and imaging. Metabolic panel without any acute abnormality specifically kidney function within normal limits and no significant electrolyte abnormalities. CBC without leukocytosis or significant anemia.   Imaging Studies:  NAD. I personally reviewed all imaging studies and no acute abnormality found. I agree  with radiology interpretation.    Cardiac Monitoring:  The patient was maintained on a cardiac monitor.  I personally viewed and interpreted the cardiac monitored which showed an underlying rhythm of: EKG non-ischemic   Medicines ordered:  I ordered medication including Maalox  for acid reflux Reevaluation of the patient after these medicines showed that the patient resolved I have reviewed the patients home medicines and have made adjustments as needed   Reevaluation:  After the interventions noted above I re-evaluated patient and found that they have :improved   Social Determinants of Health:  The patient's social determinants of health were a factor in the care of this patient  Problem List / ED Course:  Patient presents to the ED with elevated blood pressure reading while at home yesterday around 10 AM, also noted to be having palpitations, this episode was short-lived and subsided without any intervention.  There was no associated chest pain, no shortness of breath.  He has been evaluated by cardiology previously does have an echo on the chart from 2019 with an  LV EF: 65% -   70%. Interpretation of his labs with a reassuring workup, no leukocytosis, hemoglobin is  stable.  CMP with no electrolyte derangement, kidney function is normal, LFTs are normal.  No headache, I do not suspect hypertensive urgency versus emergency.  Blood pressure was rechecked by me at 10:20 AM 139/98, heart rate is 94, nonhypoxic, no tachypnea.  Mentating well.  He is endorsing some epigastric pain from his prior history of reflux, he also had hot Cheetos the night before reports he has not eaten anything in the last 16 hours.  Given some Maalox for symptomatic treatment. I discussed with him the importance of following up with cardiology. Patient stable for discharge.    Dispostion:  After consideration of the diagnostic results and the patients response to treatment, I feel that the patent would benefit from follow up with cardiology.     Portions of this note were generated with [employer]. Dictation errors may occur despite best attempts at proofreading.   Final Clinical Impression(s) / ED Diagnoses Final diagnoses:  Elevated blood pressure reading    Rx / DC Orders ED Discharge Orders     None         Janeece Fitting, PA-C 10/07/22 1048    Pattricia Boss, MD 10/08/22 1313

## 2022-10-07 NOTE — Discharge Instructions (Addendum)
Your laboratories will be within normal limits today.  You can follow-up with your primary care physician for your ongoing blood pressure.

## 2022-10-08 ENCOUNTER — Telehealth: Payer: Self-pay | Admitting: Medical

## 2022-10-08 NOTE — Telephone Encounter (Signed)
Left message for pt to call me back 

## 2022-10-08 NOTE — Telephone Encounter (Signed)
[name] called and stated you all changed dosage his atorvastatin and he notices it makes him light headed. He said he has taken in for today but is unsure if he should take it for tomorrow.  Tagged Gabriel Cirri because I will be leaving 12:30 today

## 2022-10-09 NOTE — Telephone Encounter (Signed)
Advised pt of Shane's instructions.  Scheduled him for follow up

## 2022-10-10 ENCOUNTER — Telehealth: Payer: Self-pay

## 2022-10-10 NOTE — Patient Outreach (Signed)
  Care Coordination TOC Note Transition Care Management Follow-up Telephone Call Date of discharge and from where: 10/07/22-[facility] on EMMI-ED Discharge Alert Reason: "Scheduled follow-up appt? No" Red Alert Date: 10/09/22 How have you been since you were released from the hospital? Patient voices he is "doing good and BP much better." He has not checked BP today yet but states BP last evening was 115/83 HR 72. Patient aware of BP parameters and when to alert MD. He voices he is adhering to low salt diet and stopped adding salt to his foods in 2009.  Any questions or concerns? No  Items Reviewed: Did the pt receive and understand the discharge instructions provided? Yes  Medications obtained and verified? Yes  Other? Yes  Any new allergies since your discharge? No  Dietary orders reviewed? Yes Do you have support at home? Yes   Home Care and Equipment/Supplies: Were home health services ordered? not applicable If so, what is the name of the agency? N/A  Has the agency set up a time to come to the patient's home? not applicable Were any new equipment or medical supplies ordered?  No What is the name of the medical supply agency? N/A Were you able to get the supplies/equipment? not applicable Do you have any questions related to the use of the equipment or supplies? No  Functional Questionnaire: (I = Independent and D = Dependent) ADLs: I  Bathing/Dressing- I  Meal Prep- I  Eating- I  Maintaining continence- I  Transferring/Ambulation- I  Managing Meds- I  Follow up appointments reviewed:  PCP Hospital f/u appt confirmed? Yes  Scheduled to see Elisha Ponder on 01/07/23 @ 10 am. Patient states he talked with office and was advised this appt is okay [facility] f/u appt confirmed?  N/A   Are transportation arrangements needed? No  If their condition worsens, is the pt aware to call PCP or go to the Emergency Dept.? Yes Was the patient provided with contact  information for the PCP's office or ED? Yes Was to pt encouraged to call back with questions or concerns? Yes  SDOH assessments and interventions completed:   Yes SDOH Interventions Today    Flowsheet Row Most Recent Value  SDOH Interventions   Food Insecurity Interventions Intervention Not Indicated       Care Coordination Interventions:  Education provided on BP mgmt    Encounter Outcome:  Pt. Visit Completed    Enzo Montgomery, RN,BSN,CCM [facility] Telephonic Care Management Coordinator Direct Phone: [phone] Toll Free: [phone] Fax: [phone]

## 2022-11-27 ENCOUNTER — Other Ambulatory Visit: Payer: Self-pay | Admitting: Medical

## 2022-11-27 DIAGNOSIS — K219 Gastro-esophageal reflux disease without esophagitis: Secondary | ICD-10-CM

## 2022-12-24 ENCOUNTER — Other Ambulatory Visit: Payer: Self-pay | Admitting: Cardiology

## 2023-01-07 ENCOUNTER — Ambulatory Visit (INDEPENDENT_AMBULATORY_CARE_PROVIDER_SITE_OTHER): Payer: Medicare HMO | Admitting: Medical

## 2023-01-07 VITALS — BP 118/68 | HR 71 | Wt 337.4 lb

## 2023-01-07 DIAGNOSIS — G4733 Obstructive sleep apnea (adult) (pediatric): Secondary | ICD-10-CM | POA: Diagnosis not present

## 2023-01-07 DIAGNOSIS — I10 Essential (primary) hypertension: Secondary | ICD-10-CM | POA: Diagnosis not present

## 2023-01-07 DIAGNOSIS — R7301 Impaired fasting glucose: Secondary | ICD-10-CM

## 2023-01-07 DIAGNOSIS — E782 Mixed hyperlipidemia: Secondary | ICD-10-CM

## 2023-01-07 DIAGNOSIS — Z6841 Body Mass Index (BMI) 40.0 and over, adult: Secondary | ICD-10-CM | POA: Diagnosis not present

## 2023-01-07 DIAGNOSIS — Z7185 Encounter for immunization safety counseling: Secondary | ICD-10-CM | POA: Diagnosis not present

## 2023-01-07 DIAGNOSIS — I110 Hypertensive heart disease with heart failure: Secondary | ICD-10-CM | POA: Diagnosis not present

## 2023-01-07 DIAGNOSIS — I509 Heart failure, unspecified: Secondary | ICD-10-CM | POA: Diagnosis not present

## 2023-01-07 MED ORDER — EZETIMIBE 10 MG PO TABS: 10.0000 mg | ORAL_TABLET | Freq: Every day | ORAL | 2 refills | Status: DC

## 2023-01-07 NOTE — Patient Instructions (Addendum)
  High cholesterol Continue Atorvastatin Lipitor 40mg  daily since you didn't tolerate the 80mg  Begin Zetia 10mg  daily along with Atorvastatin Plan to go see your heart doctor in late June fasting to they can recheck your labs at that time for cholesterol   High blood pressure Continue your current medications Continue Amlodipine 10 milligrams daily Continue carvedilol 12.5 mg twice daily Continue chlorthalidone 25 mg daily Continue irbesartan 300 mg daily   Obesity I want you to lose weight Try to lose 20 pounds in the next 4-6 months Try to exercise every day with at least 30 minutes of walking If you are drinking any sugary drinks, then cut that out   Prediabetes Prediabetes means you have a higher than normal blood sugar level. It's not high enough to be considered type 2 diabetes yet, but without making some lifestyle changes you are more likely to develop type 2 diabetes.  If you have prediabetes, the long-term damage of diabetes, especially to your heart, blood vessels and kidneys may already be starting. You may not be able to change certain risk factors such as age, race, or family history, but you CAN make changes to your lifestyle, your eating habits, and your activity.  Although diabetes can develop at any age, the risk of prediabetes increases after age 66.  Your risk of prediabetes increases if you have a parent or sibling with type 2 diabetes.   Although it's unclear why, certain people including Black, Hispanic, American [place] and [place] American people, are more likely to develop prediabetes.  Ways to prevent or slow progression to diabetes: Eat healthy foods - Eating red meat and processed meat, and drinking sugar-sweetened beverages, is associated with a higher risk of prediabetes. A diet high in fruits, vegetables, nuts, whole grains and olive oil is associated with a lower risk of prediabetes. Get at least 150 minutes of moderate aerobic physical activity a week, or  about 30 minutes on most days of the week.  The less active you are, the greater your risk of prediabetes. Physical activity helps you control your weight, uses up sugar for energy and makes the body use insulin more effectively. Lose excess weight - Being overweight is a primary risk factor for prediabetes. The more fatty tissue you have, especially inside and between the muscle and skin around your abdomen, the more resistant your cells become to insulin. Waist size. A large waist size can indicate insulin resistance. The risk of insulin resistance goes up for men with waists larger than 40 inches and for women with waists larger than 35 inches. Control your blood pressure and cholesterol.  If your blood pressure is not 130/80 or less, discuss with your provider to help get this under control.  It is ideal to have an HDL good cholesterol number >50 and have a LDL bad cholesterol number <100.   Don't smoke One simple strategy to help you make good food choices and eat appropriate portions sizes is to divide up your plate. These three divisions on your plate promote healthy eating:  One-half: fruit and nonstarchy vegetables One-quarter: whole grains One-quarter: protein-rich foods, such as legumes, fish or lean meats

## 2023-01-07 NOTE — Progress Notes (Signed)
Subjective: Chief Complaint  Patient presents with   follow-up on prediabetes    Follow-up on prediabetes, no concerns, not fasting    Medical team: Dr. Donato Schultz, Robin Searing, NP, cardiology Eye doctor Dentist Dr. Annell Greening, ortho Dr. Corliss Parish, GI Larri Brewton, Kermit Balo, PA-C here for primary care   Concerns: 62 y.o. male with PMH of HTN, HLD, GERD, OSA, asthma, family history of CAD, HFpEF who presents today for follow-up of hypertension.   Here for recheck on lipids.  Last visit in January we increased atorvastatin Lipitor to 80 mg daily.  However after few days he started getting dizziness.  He stopped the Lipitor and the dizziness went away.  Thus he went back to 40 mg which is what he has been taking since then.  He was prediabetic last visit.  He has not been able to lose any weight since last visit.  He notes that he can work on some dietary changes.  History of sleep apnea, did not tolerate CPAP  Drinks occasional ETOH  Past Medical History:  Diagnosis Date   Asthma    mild, no inhaler used   Bilateral renal cysts 04/25/2019   CHF (congestive heart failure) (HCC)    DJD (degenerative joint disease)    has taken tramadol 50mg  q8h in past   Elevated LDL cholesterol level 06/19/2019   Esophageal reflux 04/23/2016   GERD (gastroesophageal reflux disease)    Hyperlipidemia    Hypertension    Morbid obesity (HCC)    weighed in excess of 500 lbs at one point   OSA (obstructive sleep apnea)    did not tolerate CPAP, uses nasal cannula   Proteinuria    GFR 94.12 February 2015   Current Outpatient Medications on File Prior to Visit  Medication Sig Dispense Refill   amLODipine (NORVASC) 10 MG tablet Take 1 tablet (10 mg total) by mouth daily. 90 tablet 3   atorvastatin (LIPITOR) 80 MG tablet Take 1 tablet (80 mg total) by mouth daily. (Patient taking differently: Take 80 mg by mouth daily. Patient taking 40mg  (half a tablet)) 90 tablet 1   carvedilol (COREG) 12.5 MG  tablet Take 1 tablet (12.5 mg total) by mouth 2 (two) times daily with a meal. 180 tablet 3   chlorthalidone (HYGROTON) 25 MG tablet TAKE 1 TABLET (25 MG TOTAL) BY MOUTH DAILY. 90 tablet 1   irbesartan (AVAPRO) 300 MG tablet Take 1 tablet (300 mg total) by mouth daily. 90 tablet 3   omeprazole (PRILOSEC) 20 MG capsule TAKE 1 CAPSULE BY MOUTH EVERY DAY 90 capsule 1   No current facility-administered medications on file prior to visit.   ROS as in subjective    Objective: BP 118/68   Pulse 71   Wt (!) 337 lb 6.4 oz (153 kg)   BMI 52.84 kg/m    Gen: wd, wn,nad Heart regular rate and rhythm, normal S1-S2, no murmurs Lungs clear Pulses within normal limits upper and lower extremities Mild varicose veins bilateral lower extremities    Assessment: Encounter Diagnoses  Name Primary?   Mixed hyperlipidemia Yes   Primary hypertension    OSA (obstructive sleep apnea)    Impaired fasting blood sugar    BMI 50.0-59.9, adult (HCC)      Plan: High cholesterol Continue Atorvastatin Lipitor 40mg  daily since you didn't tolerate the 80mg  Begin Zetia 10mg  daily along with Atorvastatin Plan to go see your heart doctor in late June fasting to they can recheck your labs at  that time for cholesterol   High blood pressure Continue your current medications Continue 10 milligrams daily Continue carvedilol 12.5 mg twice daily Continue chlorthalidone 25 mg daily Continue irbesartan 300 mg daily   Obesity I want you to lose weight Try to lose 20 pounds in the next 4-6 months Try to exercise every day with at least 30 minutes of walking If you are drinking any sugary drinks, then cut that out   Prediabetes Prediabetes means you have a higher than normal blood sugar level. It's not high enough to be considered type 2 diabetes yet, but without making some lifestyle changes you are more likely to develop type 2 diabetes.  If you have prediabetes, the long-term damage of diabetes,  especially to your heart, blood vessels and kidneys may already be starting. You may not be able to change certain risk factors such as age, race, or family history, but you CAN make changes to your lifestyle, your eating habits, and your activity.  Although diabetes can develop at any age, the risk of prediabetes increases after age 71.  Your risk of prediabetes increases if you have a parent or sibling with type 2 diabetes.   Although it's unclear why, certain people including Black, Hispanic, American [place] and [place] American people, are more likely to develop prediabetes.  Ways to prevent or slow progression to diabetes: Eat healthy foods - Eating red meat and processed meat, and drinking sugar-sweetened beverages, is associated with a higher risk of prediabetes. A diet high in fruits, vegetables, nuts, whole grains and olive oil is associated with a lower risk of prediabetes. Get at least 150 minutes of moderate aerobic physical activity a week, or about 30 minutes on most days of the week.  The less active you are, the greater your risk of prediabetes. Physical activity helps you control your weight, uses up sugar for energy and makes the body use insulin more effectively. Lose excess weight - Being overweight is a primary risk factor for prediabetes. The more fatty tissue you have, especially inside and between the muscle and skin around your abdomen, the more resistant your cells become to insulin. Waist size. A large waist size can indicate insulin resistance. The risk of insulin resistance goes up for men with waists larger than 40 inches and for women with waists larger than 35 inches. Control your blood pressure and cholesterol.  If your blood pressure is not 130/80 or less, discuss with your provider to help get this under control.  It is ideal to have an HDL good cholesterol number >50 and have a LDL bad cholesterol number <100.   Don't smoke One simple strategy to help you make good food  choices and eat appropriate portions sizes is to divide up your plate. These three divisions on your plate promote healthy eating:  One-half: fruit and nonstarchy vegetables One-quarter: whole grains One-quarter: protein-rich foods, such as legumes, fish or lean meats   Vaccine counseling I recommend you get the shingles vaccine at your local pharmacy   F/u 52mo

## 2023-01-30 ENCOUNTER — Other Ambulatory Visit: Payer: Self-pay | Admitting: Medical

## 2023-01-30 DIAGNOSIS — E782 Mixed hyperlipidemia: Secondary | ICD-10-CM

## 2023-02-09 ENCOUNTER — Other Ambulatory Visit: Payer: Self-pay | Admitting: Medical

## 2023-02-09 DIAGNOSIS — E782 Mixed hyperlipidemia: Secondary | ICD-10-CM

## 2023-02-09 NOTE — Telephone Encounter (Signed)
Pt was increased to 80mg  from 40mg 

## 2023-02-20 ENCOUNTER — Telehealth: Payer: Self-pay | Admitting: Medical

## 2023-02-20 NOTE — Telephone Encounter (Signed)
Pt scheduled fu for 7/1

## 2023-02-20 NOTE — Telephone Encounter (Signed)
Pt called and states he has been cutting his atorvastatin (LIPITOR) 80 MG tablet  In half but it is still making him fel dizzy and nauseous , he hasn't taken it in the past 2 days and he has felt better.

## 2023-02-24 ENCOUNTER — Telehealth: Payer: Self-pay | Admitting: Pharmacist

## 2023-02-24 NOTE — Addendum Note (Signed)
Addended by: Malena Peer D on: 02/24/2023 01:13 PM   Modules accepted: Orders

## 2023-02-24 NOTE — Telephone Encounter (Signed)
Pt c/o medication issue:  1. Name of Medication:   amLODipine (NORVASC) 10 MG tablet    carvedilol (COREG) 12.5 MG tablet    chlorthalidone (HYGROTON) 25 MG tablet    irbesartan (AVAPRO) 300 MG tablet    2. How are you currently taking this medication (dosage and times per day)? As written   3. Are you having a reaction (difficulty breathing--STAT)? No   4. What is your medication issue? Pt called in asking to speak with pharmacist Melissa, RPH-CPP. Pt states he has been experiencing dizziness when he bends down and gets up. He believes taking these 4 bp medications are causing the dizziness. He stated that he did not take the carvedilol last night and he feels pretty fine today. He asked if there is one of the medications he can stop taking. Please advise.

## 2023-02-24 NOTE — Telephone Encounter (Signed)
I spoke to patient. Reports BP is 118/77 HR76. Reports dizziness when he stands up, bends over and sometimes when he is walking. Thinks he would feel better off the medications. I advised for him to decrease his amlodipine to 5mg  daily and continue carvedilol 12.5mg  BID, irbesartan 300mg  daily and chlorthalidone 25mg  daily. Advised pt to call if he does not feel better or his BP increases to >130/80.

## 2023-03-05 ENCOUNTER — Ambulatory Visit: Payer: Medicare HMO | Admitting: Cardiology

## 2023-03-09 ENCOUNTER — Ambulatory Visit (INDEPENDENT_AMBULATORY_CARE_PROVIDER_SITE_OTHER): Payer: Medicare HMO | Admitting: Medical

## 2023-03-09 VITALS — BP 98/70 | HR 71 | Wt 319.4 lb

## 2023-03-09 DIAGNOSIS — E782 Mixed hyperlipidemia: Secondary | ICD-10-CM

## 2023-03-09 DIAGNOSIS — R42 Dizziness and giddiness: Secondary | ICD-10-CM | POA: Diagnosis not present

## 2023-03-09 DIAGNOSIS — M25552 Pain in left hip: Secondary | ICD-10-CM | POA: Diagnosis not present

## 2023-03-09 DIAGNOSIS — R7301 Impaired fasting glucose: Secondary | ICD-10-CM | POA: Diagnosis not present

## 2023-03-09 DIAGNOSIS — I10 Essential (primary) hypertension: Secondary | ICD-10-CM

## 2023-03-09 LAB — POCT GLYCOSYLATED HEMOGLOBIN (HGB A1C): Hemoglobin A1C: 5.9 % — AB (ref 4.0–5.6)

## 2023-03-09 NOTE — Patient Instructions (Signed)
Lightheadedness Likely due to lower blood pressure readings since she has lost some weight Make sure you are drinking at least 80 to 100 ounces of water daily Currently you are doing amlodipine 5 mg or 1/2 tablet daily of the 10 mg.  Continue this for now. Continue carvedilol Coreg 12.5 mg twice daily Continue irbesartan Avapro 300 mg daily blood pressure pill Continue chlorthalidone 25 mg, however start using half tablet of this daily If making this change today helps with the dizziness and lightheadedness, we will need to change the prescription of your medicines so you do not have to keep cutting tablets in half So plan fasting recheck visit in 6 to 8 weeks assuming the lightheadedness improves  Impaired glucose, at risk for diabetes Continue efforts to lose weight through healthy eating habits and regular exercise Your diabetes marker today was 5.9%  High cholesterol Continue Zetia 10 mg daily You wish to go back to 40 mg of atorvastatin instead of 80 mg today.  I had increased this in January since your levels are not at goal Continue atorvastatin 40 mg daily along with Zetia and lets plan to recheck fasting cholesterol in 6 to 8 weeks  Left hip pain, likely some arthritis Go for the left hip x-ray  Please go to [facility] for your left hip xray.   Their hours are 8am - 4:30 pm Monday - Friday.  Take your insurance card with you.  [facility] [phone]   [address] [place], [place] [address]

## 2023-03-09 NOTE — Progress Notes (Signed)
Subjective:  [name] is a 62 y.o. male who presents for Chief Complaint  Patient presents with   Consult    Discuss medications that could be causing dizziness. Been in contact with cardiology about this for the last month and changed some of his medications     Here for c/o dizziness x about a month.  Since losing weight in recent months, he notes feeling dizziness about every day.  Feels lightheaded when he gets up in the morning and also when bending over.  If walking for a while then stop to talk to somebody gets lightheaded.   Is having lightheadedness daily.   Checks BP regularly and getting normal to low.  Sometimes SBP in the 90s.  He recent called to cardiology about this and they had him back off Amlodipine from 10mg  to 5mg  but still getting the symptoms.  He called here recently and felt his statin was giving problems so he cut that back as well.   Doesn't check blood sugars.  Was prediabetic 09/2022 .  Has been trying to lose weight with healthy diet and some walking.   No recent weakness, numbness or tingling.  No polyuria or polydipsia.    No leg swelling, skipped beats, palpitations, SOB or chest pain.     He continues on Zetia 10mg  daily, Coreg 12.5mg  BID, irbesartan 300mg  daily and chlorthalidone 25mg  daily.   He notes about 3 week hx/o left hip pain. One day recent he turned and hip popped 3 times  No other aggravating or relieving factors.    No other c/o.  Past Medical History:  Diagnosis Date   Asthma    mild, no inhaler used   Bilateral renal cysts 04/25/2019   CHF (congestive heart failure) (HCC)    DJD (degenerative joint disease)    has taken tramadol 50mg  q8h in past   Elevated LDL cholesterol level 06/19/2019   Esophageal reflux 04/23/2016   GERD (gastroesophageal reflux disease)    Hyperlipidemia    Hypertension    Morbid obesity (HCC)    weighed in excess of 500 lbs at one point   OSA (obstructive sleep apnea)    did not tolerate CPAP, uses  nasal cannula   Proteinuria    GFR 94.12 February 2015   Current Outpatient Medications on File Prior to Visit  Medication Sig Dispense Refill   amLODipine (NORVASC) 10 MG tablet Take 0.5 tablets (5 mg total) by mouth daily. 90 tablet 3   carvedilol (COREG) 12.5 MG tablet Take 1 tablet (12.5 mg total) by mouth 2 (two) times daily with a meal. 180 tablet 3   chlorthalidone (HYGROTON) 25 MG tablet TAKE 1 TABLET (25 MG TOTAL) BY MOUTH DAILY. 90 tablet 1   ezetimibe (ZETIA) 10 MG tablet Take 1 tablet (10 mg total) by mouth daily. 30 tablet 2   irbesartan (AVAPRO) 300 MG tablet Take 1 tablet (300 mg total) by mouth daily. 90 tablet 3   omeprazole (PRILOSEC) 20 MG capsule TAKE 1 CAPSULE BY MOUTH EVERY DAY 90 capsule 1   atorvastatin (LIPITOR) 80 MG tablet Take 1 tablet (80 mg total) by mouth daily. (Patient not taking: Reported on 03/09/2023) 90 tablet 1   No current facility-administered medications on file prior to visit.     The following portions of the patient's history were reviewed and updated as appropriate: allergies, current medications, past family history, past medical history, past social history, past surgical history and problem list.  ROS Otherwise as in  subjective above   Objective: BP 98/70 (Patient Position: Standing)   Pulse 71   Wt (!) 319 lb 6.4 oz (144.9 kg)   BMI 50.03 kg/m   Wt Readings from Last 3 Encounters:  03/09/23 (!) 319 lb 6.4 oz (144.9 kg)  01/07/23 (!) 337 lb 6.4 oz (153 kg)  09/29/22 (!) 335 lb 6.4 oz (152.1 kg)   General appearance: alert, no distress, well developed, well nourished HEENT: normocephalic, sclerae anicteric, conjunctiva pink and moist, TMs pearly, nares patent, no discharge or erythema, pharynx normal Oral cavity: MMM, no lesions Neck: supple, no lymphadenopathy, no thyromegaly, no masses, no bruits Heart: RRR, normal S1, S2, no murmurs Lungs: CTA bilaterally, no wheezes, rhonchi, or rales MSK: tender over left hip, but relatively  normal ROM.  When he squats or shifts weight, has some pain in left hip Pulses: 2+ radial pulses, 2+ pedal pulses, normal cap refill Ext: no edema Legs neurovascularly intact    Assessment: Encounter Diagnoses  Name Primary?   Lightheaded Yes   Primary hypertension    Left hip pain    Impaired fasting blood sugar    Mixed hyperlipidemia      Plan: Lightheadedness Likely due to lower blood pressure readings since she has lost some weight Make sure you are drinking at least 80 to 100 ounces of water daily Currently you are doing amlodipine 5 mg or 1/2 tablet daily of the 10 mg.  Continue this for now. Continue carvedilol Coreg 12.5 mg twice daily Continue irbesartan Avapro 300 mg daily blood pressure pill Continue chlorthalidone 25 mg, however start using half tablet of this daily If making this change today helps with the dizziness and lightheadedness, we will need to change the prescription of your medicines so you do not have to keep cutting tablets in half So plan fasting recheck visit in 6 to 8 weeks assuming the lightheadedness improves  Impaired glucose, at risk for diabetes Continue efforts to lose weight through healthy eating habits and regular exercise Your diabetes marker today was 5.9%  High cholesterol Continue Zetia 10 mg daily You wish to go back to 40 mg of atorvastatin instead of 80 mg today.  I had increased this in January since your levels are not at goal Continue atorvastatin 40 mg daily along with Zetia and lets plan to recheck fasting cholesterol in 6 to 8 weeks  Left hip pain, likely some arthritis Go for the left hip x-ray  Please go to [facility] for your left hip xray.   Their hours are 8am - 4:30 pm Monday - Friday.  Take your insurance card with you.  [facility] [phone]   [address] [place], [place] [address]   Mikelle was seen today for consult.  Diagnoses and all orders for this visit:  Lightheaded -      Orthostatic vital signs  Primary hypertension  Left hip pain -     DG HIP UNILAT WITH PELVIS MIN 4 VIEWS LEFT; Future  Impaired fasting blood sugar -     HgB A1c  Mixed hyperlipidemia    Follow up: pending call back in 2 weeks, xray

## 2023-03-11 ENCOUNTER — Telehealth: Payer: Self-pay | Admitting: Medical

## 2023-03-11 NOTE — Telephone Encounter (Signed)
Pt called and states that he took 1/2 of the blood pressure last night and this morning his bp was 115/83 and he went shopping and come back and took it and it was 93/66 and said he is a ittle lighthead/dizzy he wants to know if you want him to continue him taking the 1/2 pill

## 2023-03-19 ENCOUNTER — Ambulatory Visit
Admission: RE | Admit: 2023-03-19 | Discharge: 2023-03-19 | Disposition: A | Discharge status: Home / Self Care | Payer: Medicare HMO | Source: Ambulatory Visit | Attending: Medical | Admitting: Medical

## 2023-03-19 ENCOUNTER — Other Ambulatory Visit: Payer: Self-pay | Admitting: Medical

## 2023-03-19 DIAGNOSIS — I10 Essential (primary) hypertension: Secondary | ICD-10-CM

## 2023-03-19 DIAGNOSIS — R42 Dizziness and giddiness: Secondary | ICD-10-CM

## 2023-03-19 DIAGNOSIS — E782 Mixed hyperlipidemia: Secondary | ICD-10-CM

## 2023-03-19 DIAGNOSIS — M25552 Pain in left hip: Secondary | ICD-10-CM

## 2023-03-19 DIAGNOSIS — R7301 Impaired fasting glucose: Secondary | ICD-10-CM

## 2023-03-25 NOTE — Progress Notes (Signed)
Left hip looks okay.  There is degenerative changes in the lumbar spine.  You can use Tylenol for pain.  You can use some Aleve short-term once daily for the next few days to see if this helps a little bit.  1 option would be to do some physical therapy see if this can help give you some improvements.  Let me know if you are agreeable to referral  I can see you back in the next month if the pain continues

## 2023-03-27 ENCOUNTER — Other Ambulatory Visit: Payer: Self-pay | Admitting: Medical

## 2023-04-03 ENCOUNTER — Other Ambulatory Visit: Payer: Self-pay | Admitting: Medical

## 2023-04-13 ENCOUNTER — Telehealth: Payer: Self-pay | Admitting: Medical

## 2023-04-13 NOTE — Telephone Encounter (Signed)
Pt called & states he'd been waiting on x-ray results,  said he doesn't know how to get on mychart.  I deactivated it.  Gave him results & Shane's recommendations & he said he was better,  will wait on PT for now.

## 2023-05-05 ENCOUNTER — Ambulatory Visit (INDEPENDENT_AMBULATORY_CARE_PROVIDER_SITE_OTHER): Payer: Medicare HMO | Admitting: Medical

## 2023-05-05 VITALS — BP 122/76 | HR 70 | Wt 305.2 lb

## 2023-05-05 DIAGNOSIS — E782 Mixed hyperlipidemia: Secondary | ICD-10-CM | POA: Diagnosis not present

## 2023-05-05 DIAGNOSIS — K219 Gastro-esophageal reflux disease without esophagitis: Secondary | ICD-10-CM | POA: Diagnosis not present

## 2023-05-05 DIAGNOSIS — R7301 Impaired fasting glucose: Secondary | ICD-10-CM

## 2023-05-05 DIAGNOSIS — I10 Essential (primary) hypertension: Secondary | ICD-10-CM

## 2023-05-05 DIAGNOSIS — Z2820 Immunization not carried out because of patient decision for unspecified reason: Secondary | ICD-10-CM

## 2023-05-05 NOTE — Progress Notes (Signed)
Subjective:  [name] is a 62 y.o. male who presents for Chief Complaint  Patient presents with   Medical Management of Chronic Issues    8 week fasting follow-up, no concerns, declines flu shot     Here for follow-up.  I saw him in July for several concerns.  Reviewing back of medicaiton today, he is taking zetia prn if BP elevated so may be confused about this.  Since last visit he actually stopped chlorthalidone and is only taking amlodipine if BP elevated.  Hypertension-currently using Carvedilol 12.5 mg twice daily and Irbesartan 300mg  daily.    Hyperlipidemia-currently taking  Lipitor 80 mg daily  Takkng omeprazole for GERD daily.    Getting headaches every now and then.  No numbness, tingling, chest pain or edema.    Exercises with yard work and mowing most days.   Takes care of multiple yards.     No other aggravating or relieving factors.    No other c/o.  Past Medical History:  Diagnosis Date   Asthma    mild, no inhaler used   Bilateral renal cysts 04/25/2019   CHF (congestive heart failure) (HCC)    DJD (degenerative joint disease)    has taken tramadol 50mg  q8h in past   Elevated LDL cholesterol level 06/19/2019   Esophageal reflux 04/23/2016   GERD (gastroesophageal reflux disease)    Hyperlipidemia    Hypertension    Morbid obesity (HCC)    weighed in excess of 500 lbs at one point   OSA (obstructive sleep apnea)    did not tolerate CPAP, uses nasal cannula   Proteinuria    GFR 94.12 February 2015   Current Outpatient Medications on File Prior to Visit  Medication Sig Dispense Refill   amLODipine (NORVASC) 10 MG tablet Take 0.5 tablets (5 mg total) by mouth daily. 90 tablet 3   atorvastatin (LIPITOR) 80 MG tablet TAKE 1 TABLET BY MOUTH EVERY DAY 90 tablet 0   carvedilol (COREG) 12.5 MG tablet Take 1 tablet (12.5 mg total) by mouth 2 (two) times daily with a meal. 180 tablet 3   chlorthalidone (HYGROTON) 25 MG tablet TAKE 1 TABLET (25 MG TOTAL) BY MOUTH  DAILY. 90 tablet 1   ezetimibe (ZETIA) 10 MG tablet TAKE 1 TABLET BY MOUTH EVERY DAY 90 tablet 1   irbesartan (AVAPRO) 300 MG tablet Take 1 tablet (300 mg total) by mouth daily. 90 tablet 3   omeprazole (PRILOSEC) 20 MG capsule TAKE 1 CAPSULE BY MOUTH EVERY DAY 90 capsule 1   No current facility-administered medications on file prior to visit.     The following portions of the patient's history were reviewed and updated as appropriate: allergies, current medications, past family history, past medical history, past social history, past surgical history and problem list.  ROS Otherwise as in subjective above   Objective: BP 122/76   Pulse 70   Wt (!) 305 lb 3.2 oz (138.4 kg)   BMI 47.80 kg/m   Wt Readings from Last 3 Encounters:  05/05/23 (!) 305 lb 3.2 oz (138.4 kg)  03/09/23 (!) 319 lb 6.4 oz (144.9 kg)  01/07/23 (!) 337 lb 6.4 oz (153 kg)   General appearance: alert, no distress, well developed, well nourished HEENT: normocephalic, sclerae anicteric, conjunctiva pink and moist, TMs pearly, nares patent, no discharge or erythema, pharynx normal Oral cavity: MMM, no lesions Neck: supple, no lymphadenopathy, no thyromegaly, no masses, no bruits Heart: RRR, normal S1, S2, no murmurs Lungs: CTA  bilaterally, no wheezes, rhonchi, or rales Pulses: 2+ radial pulses, 2+ pedal pulses, normal cap refill Ext: no edema Legs neurovascularly intact    Assessment: Encounter Diagnoses  Name Primary?   Primary hypertension Yes   Vaccine refused by patient    Impaired fasting blood sugar    GERD without esophagitis    Mixed hyperlipidemia    Obesity, Class III, BMI 40-49.9 (morbid obesity) (HCC)       Plan: We reviewed all your medications today and I want to make some clarification below so there is no confusion.  This is your current regimen that you should be taking:  High blood pressure Continue carvedilol 12.5 mg twice daily Continue irbesartan 300 mg once daily  You  currently are not taking chlorthalidone fluid pill and given that your blood pressure looks good and you have lost weight.  Discontinue Amlodipine as well   High cholesterol Continue Atorvastatin 80 mg daily Continue ezetimibe/Zetia 10 mg daily.  For what ever reason you are using Zetia as needed but this should be taken daily along with atorvastatin to control your cholesterol numbers  Acid reflux Continue omeprazole 20 mg daily as needed As you lose weight reflux tend to get better.  You may find that you do not need to be on this is months going forward A safer option is Pepcid famotidine over-the-counter since this does not cause problems with bone density or as omeprazole over the long haul can cause decreased bone mineral density and osteoporosis   Continue efforts to lose weight as you are doing   Regarding headaches, this can be due to a variety of things Continue to monitor your blood pressures.  The goal is 120/70 If you start seeing low numbers such as 105/60 or less then you need to let me know so we can back off some additional blood pressure medication Having a drop in blood pressure could potentially cause headaches Not being well-hydrated can cause headaches and make sure you are drinking 80 to 100 ounces of water daily Limit caffeine as this can cause headaches, consider cutting back on coffee Skipping meals abruptly can cause headaches to try to eat consistent meals I would try to avoid taking anything for headache every single day as you can get a medication induced headache if you are taking pain medicine or anti-inflammatories every day Limit BC powders or other anti-inflammatories as these can put you at risk for ulcer or elevated blood pressure Try Tylenol for headaches or Excedrin.  Use anti-inflammatory like ibuprofen or BC powder as a last resort If your headaches continue to get frequent or worse then let me know   [name] was seen today for medical management of  chronic issues.  Diagnoses and all orders for this visit:  Primary hypertension  Vaccine refused by patient  Impaired fasting blood sugar  GERD without esophagitis  Mixed hyperlipidemia  Obesity, Class III, BMI 40-49.9 (morbid obesity) (HCC)     Follow up: 4-6 mo

## 2023-05-05 NOTE — Patient Instructions (Addendum)
We reviewed all your medications today and I want to make some clarification below so there is no confusion.  This is your current regimen that you should be taking:  High blood pressure Continue carvedilol 12.5 mg twice daily Continue irbesartan 300 mg once daily  You currently are not taking chlorthalidone fluid pill and given that your blood pressure looks good and you have lost weight.  Discontinue Amlodipine as well   High cholesterol Continue Atorvastatin 80 mg daily Continue ezetimibe/Zetia 10 mg daily.  For what ever reason you are using Zetia as needed but this should be taken daily along with atorvastatin to control your cholesterol numbers  Acid reflux Continue omeprazole 20 mg daily as needed As you lose weight reflux tend to get better.  You may find that you do not need to be on this is months going forward A safer option is Pepcid famotidine over-the-counter since this does not cause problems with bone density or as omeprazole over the long haul can cause decreased bone mineral density and osteoporosis   Continue efforts to lose weight as you are doing   Regarding headaches, this can be due to a variety of things Continue to monitor your blood pressures.  The goal is 120/70 If you start seeing low numbers such as 105/60 or less then you need to let me know so we can back off some additional blood pressure medication Having a drop in blood pressure could potentially cause headaches Not being well-hydrated can cause headaches and make sure you are drinking 80 to 100 ounces of water daily Limit caffeine as this can cause headaches, consider cutting back on coffee Skipping meals abruptly can cause headaches to try to eat consistent meals I would try to avoid taking anything for headache every single day as you can get a medication induced headache if you are taking pain medicine or anti-inflammatories every day Limit BC powders or other anti-inflammatories as these can put  you at risk for ulcer or elevated blood pressure Try Tylenol for headaches or Excedrin.  Use anti-inflammatory like ibuprofen or BC powder as a last resort If your headaches continue to get frequent or worse then let me know

## 2023-05-23 ENCOUNTER — Other Ambulatory Visit: Payer: Self-pay | Admitting: Medical

## 2023-05-23 DIAGNOSIS — K219 Gastro-esophageal reflux disease without esophagitis: Secondary | ICD-10-CM

## 2023-06-09 ENCOUNTER — Other Ambulatory Visit: Payer: Self-pay | Admitting: Cardiology

## 2023-07-01 ENCOUNTER — Other Ambulatory Visit: Payer: Self-pay | Admitting: Medical

## 2023-07-14 ENCOUNTER — Encounter: Payer: Self-pay | Admitting: Cardiology

## 2023-07-14 ENCOUNTER — Encounter: Payer: Medicare HMO | Admitting: Medical

## 2023-07-14 ENCOUNTER — Ambulatory Visit: Payer: Medicare HMO | Attending: Cardiology | Admitting: Cardiology

## 2023-07-14 ENCOUNTER — Ambulatory Visit: Payer: Medicare HMO

## 2023-07-14 VITALS — BP 124/82 | HR 71 | Ht 67.0 in | Wt 291.0 lb

## 2023-07-14 DIAGNOSIS — I10 Essential (primary) hypertension: Secondary | ICD-10-CM | POA: Diagnosis not present

## 2023-07-14 DIAGNOSIS — R0789 Other chest pain: Secondary | ICD-10-CM

## 2023-07-14 DIAGNOSIS — Z0000 Encounter for general adult medical examination without abnormal findings: Secondary | ICD-10-CM

## 2023-07-14 NOTE — Patient Instructions (Signed)

## 2023-07-14 NOTE — Progress Notes (Signed)
Subjective:   [name] is a 62 y.o. male who presents for Medicare Annual/Subsequent preventive examination.  Visit Complete: Virtual I connected with  [name] on 07/14/23 by a audio enabled telemedicine application and verified that I am speaking with the correct person using two identifiers.  Patient Location: Home  Provider Location: Office/Clinic  I discussed the limitations of evaluation and management by telemedicine. The patient expressed understanding and agreed to proceed.  Vital Signs: Because this visit was a virtual/telehealth visit, some criteria may be missing or patient reported. Any vitals not documented were not able to be obtained and vitals that have been documented are patient reported.  .  Cardiac Risk Factors include: advanced age (>60men, >17 women);dyslipidemia;hypertension;male gender     Objective:    Today's Vitals   There is no height or weight on file to calculate BMI.     07/14/2023   11:32 AM 10/06/2022    8:16 PM 05/10/2022    2:53 PM 09/27/2021   11:18 AM 04/22/2013    5:49 AM 04/15/2013    1:01 PM  Advanced Directives  Does Patient Have a Medical Advance Directive? Yes No No Yes  Patient does not have advance directive;Patient would like information  Type of Advance Directive Out of facility DNR (pink MOST or yellow form)   Out of facility DNR (pink MOST or yellow form)    Would patient like information on creating a medical advance directive?      Advance directive packet given  Pre-existing out of facility DNR order (yellow form or pink MOST form)     No No    Current Medications (verified) Outpatient Encounter Medications as of 07/14/2023  Medication Sig   atorvastatin (LIPITOR) 80 MG tablet TAKE 1 TABLET BY MOUTH EVERY DAY   carvedilol (COREG) 12.5 MG tablet TAKE 1 TABLET (12.5MG  TOTAL) BY MOUTH TWICE A DAY WITH MEALS   omeprazole (PRILOSEC) 20 MG capsule TAKE 1 CAPSULE BY MOUTH EVERY DAY   ezetimibe (ZETIA) 10 MG tablet TAKE  1 TABLET BY MOUTH EVERY DAY (Patient not taking: Reported on 07/14/2023)   irbesartan (AVAPRO) 300 MG tablet TAKE 1 TABLET BY MOUTH EVERY DAY (Patient not taking: Reported on 07/14/2023)   No facility-administered encounter medications on file as of 07/14/2023.    Allergies (verified) Atorvastatin and Tomato   History: Past Medical History:  Diagnosis Date   Asthma    mild, no inhaler used   Bilateral renal cysts 04/25/2019   CHF (congestive heart failure) (HCC)    DJD (degenerative joint disease)    has taken tramadol 50mg  q8h in past   Elevated LDL cholesterol level 06/19/2019   Esophageal reflux 04/23/2016   GERD (gastroesophageal reflux disease)    Hyperlipidemia    Hypertension    Morbid obesity (HCC)    weighed in excess of 500 lbs at one point   OSA (obstructive sleep apnea)    did not tolerate CPAP, uses nasal cannula   Proteinuria    GFR 94.12 February 2015   Past Surgical History:  Procedure Laterality Date   COLONOSCOPY N/A 04/22/2013   Procedure: COLONOSCOPY;  Surgeon: Kandis Cocking, MD;  Location: [facility];  Service: General;  Laterality: N/A;   leg surgery for fracture Right 2008 or 2009   SPHINCTEROTOMY Left 04/22/2013   Procedure: Exam under anesthesia, left lateral internal SPHINCTEROTOMY;  Surgeon: Kandis Cocking, MD;  Location: [facility];  Service: General;  Laterality: Left;   Family History  Problem Relation Age of Onset   Heart disease Father        MI, DM2, HTN   Hypertension Mother        81 DM2, HTN   Cancer Mother    Diabetes Brother    Stroke Brother    Colon cancer Neg Hx    Esophageal cancer Neg Hx    Inflammatory bowel disease Neg Hx    Liver disease Neg Hx    Pancreatic cancer Neg Hx    Rectal cancer Neg Hx    Stomach cancer Neg Hx    Social History   Socioeconomic History   Marital status: Single    Spouse name: Not on file   Number of children: Not on file   Years of education: Not on file   Highest education level: Not on file   Occupational History   Not on file  Tobacco Use   Smoking status: Former    Current packs/day: 0.00    Average packs/day: 0.2 packs/day for 1.5 years (0.3 ttl pk-yrs)    Types: Cigarettes    Start date: 03/1979    Quit date: 1982    Years since quitting: 42.8   Smokeless tobacco: Never  Vaping Use   Vaping status: Never Used  Substance and Sexual Activity   Alcohol use: Yes    Comment: ocassionally   Drug use: No   Sexual activity: Yes    Partners: Female  Other Topics Concern   Not on file  Social History Narrative   Lives alone. No children.   Disabled due to heart and lung disease.  Exercise - walking some.  09/2022.   Social Determinants of Health   Financial Resource Strain: Low Risk  (07/14/2023)   Overall Financial Resource Strain (CARDIA)    Difficulty of Paying Living Expenses: Not hard at all  Food Insecurity: No Food Insecurity (07/14/2023)   Hunger Vital Sign    Worried About Running Out of Food in the Last Year: Never true    Ran Out of Food in the Last Year: Never true  Transportation Needs: No Transportation Needs (07/14/2023)   PRAPARE - Administrator, Civil Service (Medical): No    Lack of Transportation (Non-Medical): No  Physical Activity: Insufficiently Active (07/14/2023)   Exercise Vital Sign    Days of Exercise per Week: 2 days    Minutes of Exercise per Session: 30 min  Stress: No Stress Concern Present (07/14/2023)   [employer] of Occupational Health - Occupational Stress Questionnaire    Feeling of Stress : Not at all  Social Connections: Socially Isolated (07/14/2023)   Social Connection and Isolation Panel [NHANES]    Frequency of Communication with Friends and Family: More than three times a week    Frequency of Social Gatherings with Friends and Family: Once a week    Attends Religious Services: Never    Database administrator or Organizations: No    Attends Engineer, structural: Never    Marital Status: Divorced     Tobacco Counseling Counseling given: Not Answered   Clinical Intake:  Pre-visit preparation completed: Yes  Pain : No/denies pain     Nutritional Risks: None Diabetes: No  How often do you need to have someone help you when you read instructions, pamphlets, or other written materials from your doctor or pharmacy?: 1 - Never  Interpreter Needed?: No  Information entered by :: NAllen LPN   Activities of Daily Living  07/14/2023   11:27 AM 09/29/2022    9:34 AM  In your present state of health, do you have any difficulty performing the following activities:  Hearing? 0 0  Vision? 0 0  Difficulty concentrating or making decisions? 0 0  Walking or climbing stairs? 1 1  Comment bad knees sometimes walking  Dressing or bathing? 0 0  Doing errands, shopping? 0 0  Preparing Food and eating ? N N  Using the Toilet? N N  In the past six months, have you accidently leaked urine? N N  Do you have problems with loss of bowel control? N N  Managing your Medications? N N  Managing your Finances? N N  Housekeeping or managing your Housekeeping? N N    Patient Care Team: Tysinger, Kermit Balo, PA-C as PCP - General (Family Medicine) Jake Bathe, MD as PCP - Cardiology (Cardiology)  Indicate any recent Medical Services you may have received from other than [facility] providers in the past year (date may be approximate).     Assessment:   This is a routine wellness examination for [name].  Hearing/Vision screen Hearing Screening - Comments:: Denies hearing issues Vision Screening - Comments:: No regular eye exams   Goals Addressed             This Visit's Progress    Patient Stated       07/14/2023, wants to lose weight       Depression Screen    07/14/2023   11:33 AM 01/07/2023   10:02 AM 09/29/2022    9:30 AM 05/13/2022    3:54 PM 09/27/2021   11:19 AM 01/07/2021    2:10 PM 06/28/2020    8:47 AM  PHQ 2/9 Scores  PHQ - 2 Score 0 0 0 0 0 0 0  PHQ- 9 Score 0           Fall Risk    07/14/2023   11:32 AM 01/07/2023   10:02 AM 09/29/2022    9:29 AM 05/13/2022    3:54 PM 09/27/2021   11:18 AM  Fall Risk   Falls in the past year? 0 0 0 0 0  Number falls in past yr: 0 0 0 0   Injury with Fall? 0 0 0 0   Risk for fall due to : Medication side effect No Fall Risks No Fall Risks No Fall Risks Medication side effect  Follow up Falls prevention discussed;Falls evaluation completed Falls evaluation completed Falls evaluation completed Falls evaluation completed Falls evaluation completed;Education provided;Falls prevention discussed    MEDICARE RISK AT HOME: Medicare Risk at Home Any stairs in or around the home?: Yes If so, are there any without handrails?: No (has  a ramp) Home free of loose throw rugs in walkways, pet beds, electrical cords, etc?: Yes Adequate lighting in your home to reduce risk of falls?: Yes Life alert?: No Use of a cane, walker or w/c?: No Grab bars in the bathroom?: No Shower chair or bench in shower?: No Elevated toilet seat or a handicapped toilet?: Yes  TIMED UP AND GO:  Was the test performed?  No    Cognitive Function:        07/14/2023   11:34 AM 09/27/2021   11:20 AM  6CIT Screen  What Year? 0 points 0 points  What month? 0 points 0 points  What time? 0 points 0 points  Count back from 20 0 points 0 points  Months in reverse 4 points 4 points  Repeat phrase 0 points 8 points  Total Score 4 points 12 points    Immunizations Immunization History  Administered Date(s) Administered   Influenza-Unspecified 07/01/2011, 07/19/2012   PFIZER Comirnaty(Gray Top)Covid-19 Tri-Sucrose Vaccine 11/21/2020   PFIZER(Purple Top)SARS-COV-2 Vaccination 03/27/2020, 04/17/2020   Pfizer(Comirnaty)Fall Seasonal Vaccine 12 years and older 09/29/2022   Pneumococcal Polysaccharide-23 11/03/2011   Td 11/03/2011   Tdap 02/16/2014    TDAP status: Up to date  Flu Vaccine status: Declined, Education has been provided regarding the  importance of this vaccine but patient still declined. Advised may receive this vaccine at local pharmacy or Health Dept. Aware to provide a copy of the vaccination record if obtained from local pharmacy or Health Dept. Verbalized acceptance and understanding.  Pneumococcal vaccine status: Up to date  Covid-19 vaccine status: Information provided on how to obtain vaccines.   Qualifies for Shingles Vaccine? Yes   Zostavax completed No   Shingrix Completed?: No.    Education has been provided regarding the importance of this vaccine. Patient has been advised to call insurance company to determine out of pocket expense if they have not yet received this vaccine. Advised may also receive vaccine at local pharmacy or Health Dept. Verbalized acceptance and understanding.  Screening Tests Health Maintenance  Topic Date Due   Colonoscopy  04/23/2023   INFLUENZA VACCINE  12/07/2023 (Originally 04/09/2023)   DTaP/Tdap/Td (3 - Td or Tdap) 02/17/2024   Medicare Annual Wellness (AWV)  07/13/2024   Hepatitis C Screening  Completed   HIV Screening  Completed   HPV VACCINES  Aged Out   COVID-19 Vaccine  Discontinued   Zoster Vaccines- Shingrix  Discontinued    Health Maintenance  Health Maintenance Due  Topic Date Due   Colonoscopy  04/23/2023    Colorectal cancer screening: Type of screening: Colonoscopy. Completed 04/22/2013. Repeat every 10 years  Lung Cancer Screening: (Low Dose CT Chest recommended if Age 20-80 years, 20 pack-year currently smoking OR have quit w/in 15years.) does not qualify.   Lung Cancer Screening Referral: no  Additional Screening:  Hepatitis C Screening: does qualify; Completed 06/17/2018  Vision Screening: Recommended annual ophthalmology exams for early detection of glaucoma and other disorders of the eye. Is the patient up to date with their annual eye exam?  No  Who is the provider or what is the name of the office in which the patient attends annual eye exams?  none If pt is not established with a provider, would they like to be referred to a provider to establish care? No .   Dental Screening: Recommended annual dental exams for proper oral hygiene  Diabetic Foot Exam: n/a  Community Resource Referral / Chronic Care Management: CRR required this visit?  No   CCM required this visit?  No     Plan:     I have personally reviewed and noted the following in the patient's chart:   Medical and social history Use of alcohol, tobacco or illicit drugs  Current medications and supplements including opioid prescriptions. Patient is not currently taking opioid prescriptions. Functional ability and status Nutritional status Physical activity Advanced directives List of other physicians Hospitalizations, surgeries, and ER visits in previous 12 months Vitals Screenings to include cognitive, depression, and falls Referrals and appointments  In addition, I have reviewed and discussed with patient certain preventive protocols, quality metrics, and best practice recommendations. A written personalized care plan for preventive services as well as general preventive health recommendations were provided to patient.  Barb Merino, LPN   [id number]   After Visit Summary: (Pick Up) Due to this being a telephonic visit, with patients personalized plan was offered to patient and patient has requested to Pick up at office.  Nurse Notes: none

## 2023-07-14 NOTE — Progress Notes (Signed)
  Cardiology Office Note:  .   Date:  07/14/2023  ID:  [name], DOB [birth year], MRN [id number] PCP: Genia Del  [name] Cardiologist:  Donato Schultz, MD     History of Present Illness: .   [name] is a 62 y.o. male Discussed with the use of AI scribe  History of Present Illness   The 62 year old patient with a history of atypical chest pain, hyperlipidemia, morbid obesity, and central hypertension presents for a follow-up visit. The patient reports feeling well overall, with no new or worsening symptoms. He has been adhering to his prescribed medication regimen, which includes carvedilol 12.5mg  twice daily and atorvastatin 80mg  daily. However, he reports discontinuing irbesartan and Zetia.  The patient's LDL was slightly elevated at 132 in January, but he is unsure if he was taking the cholesterol medication at the time of the lab work. He also reports taking medication for acid reflux.  The patient has been making efforts to lose weight and reports some success, with a recent weight reduction from 305 to 290. He has been encouraged to continue with these efforts, focusing on a Mediterranean diet and maintaining physical activity.               Physical Exam:   VS:  BP 124/82   Pulse 71   Ht 5\' 7"  (1.702 m)   Wt 291 lb (132 kg)   SpO2 93%   BMI 45.58 kg/m    Wt Readings from Last 3 Encounters:  07/14/23 291 lb (132 kg)  05/05/23 (!) 305 lb 3.2 oz (138.4 kg)  03/09/23 (!) 319 lb 6.4 oz (144.9 kg)    GEN: Well nourished, well developed in no acute distress NECK: No JVD; No carotid bruits CARDIAC: RRR, no murmurs, no rubs, no gallops RESPIRATORY:  Clear to auscultation without rales, wheezing or rhonchi  ABDOMEN: Soft, non-tender, non-distended EXTREMITIES:  No edema; No deformity   ASSESSMENT AND PLAN: .    Assessment and Plan    Atypical Chest Pain Negative stress test and echocardiogram. No current symptoms. -Continue  current management.  Hypertension Well controlled on Carvedilol 12.5mg  twice daily. Irbesartan discontinued previously. -Continue Carvedilol 12.5mg  twice daily.  Hyperlipidemia LDL 132 in January, possibly due to non-compliance with Atorvastatin 80mg  daily. Zetia discontinued previously. -Continue Atorvastatin 80mg  daily.  Morbid Obesity Some weight loss noted. Discussed benefits of Mediterranean diet and physical activity. -Continue efforts to lose weight through diet and exercise.  Follow-up in 1 year.              Signed, Donato Schultz, MD

## 2023-07-14 NOTE — Patient Instructions (Signed)
Mr. Pound , Thank you for taking time to come for your Medicare Wellness Visit. I appreciate your ongoing commitment to your health goals. Please review the following plan we discussed and let me know if I can assist you in the future.   Referrals/Orders/Follow-Ups/Clinician Recommendations: none  This is a list of the screening recommended for you and due dates:  Health Maintenance  Topic Date Due   Colon Cancer Screening  04/23/2023   Flu Shot  12/07/2023*   DTaP/Tdap/Td vaccine (3 - Td or Tdap) 02/17/2024   Medicare Annual Wellness Visit  07/13/2024   Hepatitis C Screening  Completed   HIV Screening  Completed   HPV Vaccine  Aged Out   COVID-19 Vaccine  Discontinued   Zoster (Shingles) Vaccine  Discontinued  *Topic was postponed. The date shown is not the original due date.    Advanced directives: (ACP Link)Information on Advanced Care Planning can be found at [facility] Advance Health Care Directives Advance Health Care Directives ([email or web address]   Next Medicare Annual Wellness Visit scheduled for next year: Yes  Insert preventive care Attachment reference

## 2023-09-13 ENCOUNTER — Other Ambulatory Visit: Payer: Self-pay | Admitting: Cardiology

## 2023-10-01 ENCOUNTER — Ambulatory Visit (INDEPENDENT_AMBULATORY_CARE_PROVIDER_SITE_OTHER): Payer: Medicare HMO | Admitting: Medical

## 2023-10-01 VITALS — BP 128/80 | HR 74 | Ht 67.0 in | Wt 273.2 lb

## 2023-10-01 DIAGNOSIS — E782 Mixed hyperlipidemia: Secondary | ICD-10-CM | POA: Diagnosis not present

## 2023-10-01 DIAGNOSIS — R7301 Impaired fasting glucose: Secondary | ICD-10-CM | POA: Diagnosis not present

## 2023-10-01 DIAGNOSIS — E66813 Obesity, class 3: Secondary | ICD-10-CM

## 2023-10-01 DIAGNOSIS — Z1389 Encounter for screening for other disorder: Secondary | ICD-10-CM | POA: Diagnosis not present

## 2023-10-01 DIAGNOSIS — G4733 Obstructive sleep apnea (adult) (pediatric): Secondary | ICD-10-CM | POA: Diagnosis not present

## 2023-10-01 DIAGNOSIS — Z0000 Encounter for general adult medical examination without abnormal findings: Secondary | ICD-10-CM | POA: Diagnosis not present

## 2023-10-01 DIAGNOSIS — I10 Essential (primary) hypertension: Secondary | ICD-10-CM

## 2023-10-01 DIAGNOSIS — Z125 Encounter for screening for malignant neoplasm of prostate: Secondary | ICD-10-CM

## 2023-10-01 DIAGNOSIS — K219 Gastro-esophageal reflux disease without esophagitis: Secondary | ICD-10-CM | POA: Diagnosis not present

## 2023-10-01 DIAGNOSIS — Z2821 Immunization not carried out because of patient refusal: Secondary | ICD-10-CM | POA: Diagnosis not present

## 2023-10-01 DIAGNOSIS — Z1211 Encounter for screening for malignant neoplasm of colon: Secondary | ICD-10-CM

## 2023-10-01 DIAGNOSIS — Z7185 Encounter for immunization safety counseling: Secondary | ICD-10-CM

## 2023-10-01 DIAGNOSIS — R748 Abnormal levels of other serum enzymes: Secondary | ICD-10-CM | POA: Diagnosis not present

## 2023-10-01 DIAGNOSIS — H9190 Unspecified hearing loss, unspecified ear: Secondary | ICD-10-CM

## 2023-10-01 DIAGNOSIS — J4520 Mild intermittent asthma, uncomplicated: Secondary | ICD-10-CM

## 2023-10-01 LAB — POCT URINALYSIS DIP (PROADVANTAGE DEVICE)
Blood, UA: NEGATIVE
Glucose, UA: NEGATIVE mg/dL
Leukocytes, UA: NEGATIVE
Nitrite, UA: NEGATIVE
Specific Gravity, Urine: 1.02
Urobilinogen, Ur: NEGATIVE
pH, UA: 6 (ref 5.0–8.0)

## 2023-10-01 NOTE — Assessment & Plan Note (Signed)
Updated labs today ?

## 2023-10-01 NOTE — Assessment & Plan Note (Signed)
Continue efforts with healthy diet and exercise.  Advised increased exercise activity

## 2023-10-01 NOTE — Assessment & Plan Note (Signed)
No recent issues

## 2023-10-01 NOTE — Progress Notes (Signed)
Complete physical exam  Patient: [name]   DOB: 1961-03-08   63 y.o. Male  MRN: [id number]  Subjective:    Chief Complaint  Patient presents with   Annual Exam    Fasting cpe, no concerns. Declines pneumonia shot    [name] is a 63 y.o. male who presents today for a complete physical exam.   Ears feel stopped up every now and then.  No URI symptoms otherwise.  No ear trauma.  No swelling or pain.  He notes weight loss through strict diet.  Exercise - walking.    He is compliant with his medications.  He saw his cardiologist in November 2021    Most recent fall risk assessment:    10/01/2023    9:22 AM  Fall Risk   Falls in the past year? 0  Number falls in past yr: 0  Injury with Fall? 0  Risk for fall due to : No Fall Risks  Follow up Falls evaluation completed     Most recent depression screenings:    10/01/2023    9:22 AM 07/14/2023   11:33 AM  PHQ 2/9 Scores  PHQ - 2 Score 0 0  PHQ- 9 Score  0    Vision:Within last year  Patient Active Problem List   Diagnosis Date Noted   Obesity, Class III, BMI 40-49.9 (morbid obesity) (HCC) 05/05/2023   Encounter for health maintenance examination in adult 09/29/2022   Impaired fasting blood sugar 09/29/2022   BMI 50.0-59.9, adult (HCC) 09/29/2022   Screening for prostate cancer 09/29/2022   Screen for colon cancer 09/29/2022   Influenza vaccination declined 06/25/2022   Vaccine counseling 06/25/2022   Mixed hyperlipidemia 10/10/2021   GERD without esophagitis 10/10/2021   Pedal edema 10/10/2021   Burn, foot, second degree, left, initial encounter 08/23/2020   Caregiver stress 06/28/2020   Neck pain on right side 06/28/2020   Mild intermittent asthma without complication 06/19/2019   Pain in left ankle and joints of left foot 06/07/2019   Bilateral renal cysts 04/25/2019   Bloating 04/22/2019   Generalized abdominal pain 04/22/2019   Mild alcohol use disorder 04/22/2019   Pyrosis 04/22/2019    Low back pain radiating to left leg 04/14/2019   Bilateral primary osteoarthritis of knee 11/11/2018   Family history of heart disease in male family member before age 42 06/17/2018   Erectile dysfunction 06/17/2018   Eosinophilia 04/16/2018   HTN (hypertension) 04/23/2016   Morbid obesity (HCC) 04/23/2016   OSA (obstructive sleep apnea) 04/23/2016   Personal history of noncompliance with medical treatment, presenting hazards to health 04/23/2016   History of proteinuria syndrome 04/23/2016   History of erectile dysfunction 04/23/2016   History of pneumonia 04/23/2016   CHF (congestive heart failure) (HCC) 04/23/2016   Rectal fissure 03/24/2013   Hemorrhoid 08/18/2012   Past Medical History:  Diagnosis Date   Asthma    mild, no inhaler used   Bilateral renal cysts 04/25/2019   CHF (congestive heart failure) (HCC)    DJD (degenerative joint disease)    has taken tramadol 50mg  q8h in past   Elevated LDL cholesterol level 06/19/2019   Esophageal reflux 04/23/2016   GERD (gastroesophageal reflux disease)    Hyperlipidemia    Hypertension    Morbid obesity (HCC)    weighed in excess of 500 lbs at one point   OSA (obstructive sleep apnea)    did not tolerate CPAP, uses nasal cannula   Proteinuria  GFR 94.12 February 2015   Past Surgical History:  Procedure Laterality Date   COLONOSCOPY N/A 04/22/2013   Procedure: COLONOSCOPY;  Surgeon: Kandis Cocking, MD;  Location: [facility];  Service: General;  Laterality: N/A;   leg surgery for fracture Right 2008 or 2009   SPHINCTEROTOMY Left 04/22/2013   Procedure: Exam under anesthesia, left lateral internal SPHINCTEROTOMY;  Surgeon: Kandis Cocking, MD;  Location: [facility];  Service: General;  Laterality: Left;   Social History   Tobacco Use   Smoking status: Former    Current packs/day: 0.00    Average packs/day: 0.2 packs/day for 1.5 years (0.3 ttl pk-yrs)    Types: Cigarettes    Start date: 03/1979    Quit date: 1982    Years since  quitting: 43.0   Smokeless tobacco: Never  Vaping Use   Vaping status: Never Used  Substance Use Topics   Alcohol use: Yes    Comment: ocassionally   Drug use: No   Family History  Problem Relation Age of Onset   Heart disease Father        MI, DM2, HTN   Hypertension Mother        76 DM2, HTN   Cancer Mother    Diabetes Brother    Stroke Brother    Colon cancer Neg Hx    Esophageal cancer Neg Hx    Inflammatory bowel disease Neg Hx    Liver disease Neg Hx    Pancreatic cancer Neg Hx    Rectal cancer Neg Hx    Stomach cancer Neg Hx    Allergies  Allergen Reactions   Atorvastatin     Dizziness at higher dose   Tomato Rash      Patient Care Team: Vinnie Gombert, Kermit Balo, PA-C as PCP - General (Family Medicine) Jake Bathe, MD as Consulting Physician (Cardiology)    Outpatient Medications Prior to Visit  Medication Sig   atorvastatin (LIPITOR) 80 MG tablet TAKE 1 TABLET BY MOUTH EVERY DAY   carvedilol (COREG) 12.5 MG tablet TAKE 1 TABLET (12.5MG  TOTAL) BY MOUTH TWICE A DAY WITH MEALS   irbesartan (AVAPRO) 300 MG tablet Take 300 mg by mouth daily.   omeprazole (PRILOSEC) 20 MG capsule TAKE 1 CAPSULE BY MOUTH EVERY DAY   No facility-administered medications prior to visit.    Review of Systems  Constitutional:  Negative for chills, fever, malaise/fatigue and weight loss.  HENT:  Negative for congestion, ear pain, hearing loss, sore throat and tinnitus.   Eyes:  Negative for blurred vision, pain and redness.  Respiratory:  Negative for cough, hemoptysis and shortness of breath.   Cardiovascular:  Negative for chest pain, palpitations, orthopnea, claudication and leg swelling.  Gastrointestinal:  Negative for abdominal pain, blood in stool, constipation, diarrhea, nausea and vomiting.  Genitourinary:  Negative for dysuria, flank pain, frequency, hematuria and urgency.  Musculoskeletal:  Negative for falls, joint pain and myalgias.  Skin:  Negative for itching and rash.   Neurological:  Negative for dizziness, tingling, speech change, weakness and headaches.  Endo/Heme/Allergies:  Negative for polydipsia. Does not bruise/bleed easily.  Psychiatric/Behavioral:  Negative for depression and memory loss. The patient is not nervous/anxious and does not have insomnia.           Objective:     BP 128/80   Pulse 74   Ht 5\' 7"  (1.702 m)   Wt 273 lb 3.2 oz (123.9 kg)   SpO2 98%   BMI 42.79 kg/m  BP Readings from Last 3 Encounters:  10/01/23 128/80  07/14/23 124/82  05/05/23 122/76   Wt Readings from Last 3 Encounters:  10/01/23 273 lb 3.2 oz (123.9 kg)  07/14/23 291 lb (132 kg)  05/05/23 (!) 305 lb 3.2 oz (138.4 kg)      Physical Exam Vitals and nursing note reviewed.  Constitutional:      General: He is not in acute distress.    Appearance: Normal appearance. He is not ill-appearing.  HENT:     Head: Normocephalic and atraumatic.     Right Ear: External ear normal.     Left Ear: External ear normal.     Nose: Nose normal.     Mouth/Throat:     Mouth: Mucous membranes are moist.     Pharynx: Oropharynx is clear.  Eyes:     Extraocular Movements: Extraocular movements intact.     Conjunctiva/sclera: Conjunctivae normal.     Pupils: Pupils are equal, round, and reactive to light.  Neck:     Vascular: No carotid bruit.  Cardiovascular:     Rate and Rhythm: Normal rate and regular rhythm.     Pulses: Normal pulses.     Heart sounds: Normal heart sounds.  Pulmonary:     Effort: Pulmonary effort is normal.     Breath sounds: Normal breath sounds.  Abdominal:     General: Bowel sounds are normal. There is no distension.     Palpations: Abdomen is soft. There is no mass.     Tenderness: There is no abdominal tenderness.     Hernia: No hernia is present.  Genitourinary:    Comments: Deferred/declined Musculoskeletal:        General: No swelling, tenderness or deformity. Normal range of motion.     Cervical back: Normal range of motion  and neck supple. No tenderness.     Right lower leg: No edema.     Left lower leg: No edema.  Lymphadenopathy:     Cervical: No cervical adenopathy.  Skin:    General: Skin is warm and dry.     Capillary Refill: Capillary refill takes less than 2 seconds.  Neurological:     General: No focal deficit present.     Mental Status: He is alert and oriented to person, place, and time. Mental status is at baseline.     Cranial Nerves: No cranial nerve deficit.     Sensory: No sensory deficit.     Motor: No weakness.     Gait: Gait normal.     Deep Tendon Reflexes: Reflexes normal.  Psychiatric:        Mood and Affect: Mood normal.        Behavior: Behavior normal.        Judgment: Judgment normal.          Assessment & Plan:    Routine Health Maintenance and Physical Exam  Immunization History  Administered Date(s) Administered   Influenza-Unspecified 07/01/2011, 07/19/2012   PFIZER Comirnaty(Gray Top)Covid-19 Tri-Sucrose Vaccine 11/21/2020   PFIZER(Purple Top)SARS-COV-2 Vaccination 03/27/2020, 04/17/2020   Pfizer(Comirnaty)Fall Seasonal Vaccine 12 years and older 09/29/2022   Pneumococcal Polysaccharide-23 11/03/2011   Td 11/03/2011   Tdap 02/16/2014   Recommended several vaccines including Shingrix, flu, Tdap, and he declines all  Health Maintenance  Topic Date Due   Colonoscopy  04/23/2023   INFLUENZA VACCINE  12/07/2023 (Originally 04/09/2023)   Pneumococcal Vaccine 75-11 Years old (2 of 2 - PCV) 09/30/2024 (Originally 11/02/2012)   DTaP/Tdap/Td (3 -  Td or Tdap) 02/17/2024   Medicare Annual Wellness (AWV)  07/13/2024   Hepatitis C Screening  Completed   HIV Screening  Completed   HPV VACCINES  Aged Out   COVID-19 Vaccine  Discontinued   Zoster Vaccines- Shingrix  Discontinued    Discussed health benefits of physical activity, and encouraged him to engage in regular exercise appropriate for his age and condition.  Problem List Items Addressed This Visit     HTN  (hypertension)   Continue irbesartan 300 mg daily, carvedilol 12.5 mg twice daily.      Relevant Medications   irbesartan (AVAPRO) 300 MG tablet   Other Relevant Orders   TSH   POCT Urinalysis DIP (Proadvantage Device)   OSA (obstructive sleep apnea)   Relevant Orders   TSH   Mild intermittent asthma without complication   No recent issues.      Mixed hyperlipidemia   Continue atorvastatin 80 mg daily.  Labs today.      Relevant Medications   irbesartan (AVAPRO) 300 MG tablet   Other Relevant Orders   Lipid panel   TSH   GERD without esophagitis   Influenza vaccination declined   Vaccine counseling   Encounter for health maintenance examination in adult   Relevant Orders   Cologuard   Comprehensive metabolic panel   CBC   Lipid panel   TSH   PSA   POCT Urinalysis DIP (Proadvantage Device)   Hemoglobin A1c   Impaired fasting blood sugar   Updated labs today.      Relevant Orders   TSH   Hemoglobin A1c   Screening for prostate cancer   Discussed risk and benefits of PSA screening.  Labs today.      Relevant Orders   PSA   Screen for colon cancer   Referral for Cologuard.  We discussed colonoscopy versus Cologuard.      Obesity, Class III, BMI 40-49.9 (morbid obesity) (HCC)   Continue efforts with healthy diet and exercise.  Advised increased exercise activity      Other Visit Diagnoses       Screening for colon cancer    -  Primary   Relevant Orders   Ambulatory referral to Gastroenterology   Cologuard     Screening for hematuria or proteinuria       Relevant Orders   POCT Urinalysis DIP (Proadvantage Device)     Decreased hearing, unspecified laterality          Return for CPX physical fasting.     Kristian Covey, PA-C

## 2023-10-01 NOTE — Assessment & Plan Note (Signed)
Discussed risk and benefits of PSA screening.  Labs today.

## 2023-10-01 NOTE — Assessment & Plan Note (Signed)
Continue atorvastatin 80 mg daily.  Labs today.

## 2023-10-01 NOTE — Assessment & Plan Note (Signed)
Referral for Cologuard.  We discussed colonoscopy versus Cologuard.

## 2023-10-01 NOTE — Assessment & Plan Note (Signed)
Continue irbesartan 300 mg daily, carvedilol 12.5 mg twice daily.

## 2023-10-01 NOTE — Patient Instructions (Signed)
DENTIST RECOMMENDATION Dr. Jeraldine Primeau [facility], dentist [address], [place], [place] [address] [phone] [email or web address]     

## 2023-10-02 ENCOUNTER — Other Ambulatory Visit: Payer: Self-pay | Admitting: Medical

## 2023-10-02 DIAGNOSIS — R7989 Other specified abnormal findings of blood chemistry: Secondary | ICD-10-CM

## 2023-10-02 LAB — CBC
Hematocrit: 44.3 % (ref 37.5–51.0)
Hemoglobin: 14.1 g/dL (ref 13.0–17.7)
MCH: 29.6 pg (ref 26.6–33.0)
MCHC: 31.8 g/dL (ref 31.5–35.7)
MCV: 93 fL (ref 79–97)
Platelets: 243 10*3/uL (ref 150–450)
RBC: 4.77 x10E6/uL (ref 4.14–5.80)
RDW: 15.2 % (ref 11.6–15.4)
WBC: 7.5 10*3/uL (ref 3.4–10.8)

## 2023-10-02 LAB — COMPREHENSIVE METABOLIC PANEL
ALT: 319 [IU]/L — ABNORMAL HIGH (ref 0–44)
AST: 169 [IU]/L — ABNORMAL HIGH (ref 0–40)
Albumin: 3.8 g/dL — ABNORMAL LOW (ref 3.9–4.9)
Alkaline Phosphatase: 66 [IU]/L (ref 44–121)
BUN/Creatinine Ratio: 18 (ref 10–24)
BUN: 19 mg/dL (ref 8–27)
Bilirubin Total: 0.3 mg/dL (ref 0.0–1.2)
CO2: 22 mmol/L (ref 20–29)
Calcium: 9.5 mg/dL (ref 8.6–10.2)
Chloride: 100 mmol/L (ref 96–106)
Creatinine, Ser: 1.07 mg/dL (ref 0.76–1.27)
Globulin, Total: 2.8 g/dL (ref 1.5–4.5)
Glucose: 77 mg/dL (ref 70–99)
Potassium: 4.3 mmol/L (ref 3.5–5.2)
Sodium: 137 mmol/L (ref 134–144)
Total Protein: 6.6 g/dL (ref 6.0–8.5)
eGFR: 78 mL/min/{1.73_m2} (ref >59)

## 2023-10-02 LAB — LIPID PANEL
Chol/HDL Ratio: 3.7 {ratio} (ref 0.0–5.0)
Cholesterol, Total: 164 mg/dL (ref 100–199)
HDL: 44 mg/dL (ref >39)
LDL Chol Calc (NIH): 104 mg/dL — ABNORMAL HIGH (ref 0–99)
Triglycerides: 85 mg/dL (ref 0–149)
VLDL Cholesterol Cal: 16 mg/dL (ref 5–40)

## 2023-10-02 LAB — TSH: TSH: 1.23 u[IU]/mL (ref 0.450–4.500)

## 2023-10-02 LAB — HEMOGLOBIN A1C
Est. average glucose Bld gHb Est-mCnc: 114 mg/dL
Hgb A1c MFr Bld: 5.6 % (ref 4.8–5.6)

## 2023-10-02 LAB — PSA: Prostate Specific Ag, Serum: 0.9 ng/mL (ref 0.0–4.0)

## 2023-10-02 NOTE — Progress Notes (Signed)
Blood sugar kidney and electrolytes normal but liver tests are quite elevated.  Cholesterol numbers look okay.  Blood counts okay.  Thyroid okay.  Prostate marker okay.  Diabetes marker okay.  See if lab can add on acute hepatitis panel and lipase  Have him temporarily stop the Lipitor atorvastatin until we figure out what is going on with the liver  Any alcohol?  If so how much in a week?  Is he using ibuprofen or Tylenol regularly or heavily?    I will probably also go ahead and recommend doing a CT abdomen pelvis but lets find out about those questions

## 2023-10-04 NOTE — Progress Notes (Signed)
Thankfully negative for hepatitis and lipase normal.  I recommend a scan of abdomen given liver test.  If agreeable, I'll place order (CT abdomen pelvis)  I'll need to see him back after scan soon

## 2023-10-05 ENCOUNTER — Other Ambulatory Visit: Payer: Self-pay | Admitting: Medical

## 2023-10-06 ENCOUNTER — Other Ambulatory Visit: Payer: Self-pay | Admitting: Medical

## 2023-10-06 DIAGNOSIS — R634 Abnormal weight loss: Secondary | ICD-10-CM

## 2023-10-06 DIAGNOSIS — R7989 Other specified abnormal findings of blood chemistry: Secondary | ICD-10-CM

## 2023-10-07 LAB — ACUTE VIRAL HEPATITIS (HAV, HBV, HCV)
HCV Ab: NONREACTIVE
Hep A IgM: NEGATIVE
Hep B C IgM: NEGATIVE
Hepatitis B Surface Ag: NEGATIVE

## 2023-10-07 LAB — HCV INTERPRETATION

## 2023-10-07 LAB — LIPASE: Lipase: 37 U/L (ref 13–78)

## 2023-10-07 LAB — SPECIMEN STATUS REPORT

## 2023-10-12 DIAGNOSIS — Z1211 Encounter for screening for malignant neoplasm of colon: Secondary | ICD-10-CM | POA: Diagnosis not present

## 2023-10-17 LAB — COLOGUARD: COLOGUARD: NEGATIVE

## 2023-10-20 ENCOUNTER — Ambulatory Visit
Admission: RE | Admit: 2023-10-20 | Discharge: 2023-10-20 | Disposition: A | Discharge status: Home / Self Care | Payer: Medicare HMO | Source: Ambulatory Visit | Attending: Medical | Admitting: Medical

## 2023-10-20 DIAGNOSIS — R634 Abnormal weight loss: Secondary | ICD-10-CM | POA: Diagnosis not present

## 2023-10-20 DIAGNOSIS — R7989 Other specified abnormal findings of blood chemistry: Secondary | ICD-10-CM

## 2023-10-20 DIAGNOSIS — I700 Atherosclerosis of aorta: Secondary | ICD-10-CM | POA: Diagnosis not present

## 2023-10-20 MED ORDER — IOPAMIDOL (ISOVUE-300) INJECTION 61%
100.0000 mL | Freq: Once | INTRAVENOUS | Status: AC | PRN
  Administered 2023-10-20: 100 mL via INTRAVENOUS

## 2023-10-21 NOTE — Progress Notes (Signed)
Thankfully the CT scan shows no obvious problem with the liver but there is a lot of feces and stool in the colon.  There is some cyst of the kidneys.  If he was not drinking a lot of alcohol around the time of his visit last, then lets have him temporarily stop his cholesterol medicine Lipitor and plan to come back in and recheck liver test again in 3 to 4 weeks  However if he is having any new or concerning symptoms, then come back now  I would recommend he take some MiraLAX over-the-counter powder once daily to help with stool and help get rid of some of the excess poop

## 2023-11-06 ENCOUNTER — Encounter: Payer: Self-pay | Admitting: Medical

## 2023-11-06 DIAGNOSIS — R7989 Other specified abnormal findings of blood chemistry: Secondary | ICD-10-CM | POA: Insufficient documentation

## 2023-11-16 ENCOUNTER — Other Ambulatory Visit: Payer: Self-pay | Admitting: Medical

## 2023-11-16 DIAGNOSIS — K219 Gastro-esophageal reflux disease without esophagitis: Secondary | ICD-10-CM

## 2023-11-18 ENCOUNTER — Ambulatory Visit: Payer: Medicare HMO | Admitting: Medical

## 2023-11-18 VITALS — BP 122/82 | HR 60 | Wt 259.6 lb

## 2023-11-18 DIAGNOSIS — K219 Gastro-esophageal reflux disease without esophagitis: Secondary | ICD-10-CM | POA: Diagnosis not present

## 2023-11-18 DIAGNOSIS — I10 Essential (primary) hypertension: Secondary | ICD-10-CM

## 2023-11-18 DIAGNOSIS — R7301 Impaired fasting glucose: Secondary | ICD-10-CM

## 2023-11-18 DIAGNOSIS — N281 Cyst of kidney, acquired: Secondary | ICD-10-CM

## 2023-11-18 DIAGNOSIS — R634 Abnormal weight loss: Secondary | ICD-10-CM | POA: Diagnosis not present

## 2023-11-18 DIAGNOSIS — R7989 Other specified abnormal findings of blood chemistry: Secondary | ICD-10-CM

## 2023-11-18 DIAGNOSIS — E782 Mixed hyperlipidemia: Secondary | ICD-10-CM

## 2023-11-18 NOTE — Progress Notes (Signed)
 Subjective:  [name] is a 63 y.o. male who presents for Chief Complaint  Patient presents with   Follow-up    Follow-up on liver test     Here to recheck on weight loss and elevated liver test.  Last visit his liver tests were elevated but he was having no particular symptoms.  We pursued a CT scan that did not show any worrisome tumor or mass  He has continued to exercise and eat healthy.  He is doing more healthy eating than the exercise.  He continues to lose weight intentionally  He has no recent B symptoms such as fever, night sweats appetite change or other.  He wonders if alcohol did play a role in his elevated liver test last visit.  His last alcohol was 3 months ago  After his liver tests are elevated last visit we temporarily stopped Lipitor which she has continue to hold  He is compliant with his carvedilol and irbesartan and Prilosec  No other aggravating or relieving factors.    No other c/o.  Past Medical History:  Diagnosis Date   Asthma    mild, no inhaler used   Bilateral renal cysts 04/25/2019   CHF (congestive heart failure) (HCC)    DJD (degenerative joint disease)    has taken tramadol 50mg  q8h in past   Elevated LDL cholesterol level 06/19/2019   Esophageal reflux 04/23/2016   GERD (gastroesophageal reflux disease)    Hyperlipidemia    Hypertension    Morbid obesity (HCC)    weighed in excess of 500 lbs at one point   OSA (obstructive sleep apnea)    did not tolerate CPAP, uses nasal cannula   Proteinuria    GFR 94.12 February 2015     The following portions of the patient's history were reviewed and updated as appropriate: allergies, current medications, past family history, past medical history, past social history, past surgical history and problem list.  ROS Otherwise as in subjective above    Objective: BP 122/82   Pulse 60   Wt 259 lb 9.6 oz (117.8 kg)   BMI 40.66 kg/m   Wt Readings from Last 3 Encounters:  11/18/23 259 lb 9.6  oz (117.8 kg)  10/01/23 273 lb 3.2 oz (123.9 kg)  07/14/23 291 lb (132 kg)    General appearance: alert, no distress, well developed, well nourished Neck: supple, no lymphadenopathy, no thyromegaly, no masses Heart: RRR, normal S1, S2, no murmurs Lungs: CTA bilaterally, no wheezes, rhonchi, or rales Abdomen: +bs, soft, non tender, non distended, no masses, no hepatomegaly, no splenomegaly Pulses: 2+ radial pulses, 2+ pedal pulses, normal cap refill Ext: no edema    CT abdomen pelvis 10/20/23 IMPRESSION: *No acute findings in the abdomen or pelvis. *Multiple bilateral renal cortical cysts. Follow-up recommended. *Moderate amount of residual fecal material throughout the colon without obstruction or constipation. *Aortic atherosclerosis.     Assessment: Encounter Diagnoses  Name Primary?   Elevated LFTs Yes   Bilateral renal cysts    GERD without esophagitis    Primary hypertension    Impaired fasting blood sugar    Mixed hyperlipidemia      Plan: We discussed his liver test that were elevated on his last visit.  CT scan did not show worrisome tumor or mass.  He was negative hepatitis panel.  Labs as below today to recheck.  Hopefully these will be back to normal.  We also discontinued his statin last visit temporarily in the event that  was causing any problem  Bilateral renal cyst noted on recent CT scan-we discussed the findings.  Continue to monitor labs.  Consider urology follow-up  GERD-no recent concerns, continue PPI as needed  Hypertension-continue irbesartan 300 mg daily and carvedilol 12.5 mg twice daily.  He will likely need to drop down on the doses if he continues to lose weight  Impaired glucose-he continues to make healthy lifestyle changes and has continued to lose weight  Mixed dyslipidemia-hold atorvastatin for now until we see what his liver tests are going to do  Weight loss that she says is intentional although he is lost significant weight in recent  months.  I reviewed his recent CT abdomen pelvis.  Continue to monitor  Haziel was seen today for follow-up.  Diagnoses and all orders for this visit:  Elevated LFTs -     Comprehensive metabolic panel -     Lipase -     CBC with Differential/Platelet -     Iron  Bilateral renal cysts  GERD without esophagitis  Primary hypertension  Impaired fasting blood sugar  Mixed hyperlipidemia    Follow up: pending labs

## 2023-11-19 LAB — CBC WITH DIFFERENTIAL/PLATELET
Basophils Absolute: 0.1 10*3/uL (ref 0.0–0.2)
Basos: 2 %
EOS (ABSOLUTE): 0.5 10*3/uL — ABNORMAL HIGH (ref 0.0–0.4)
Eos: 8 %
Hematocrit: 42.4 % (ref 37.5–51.0)
Hemoglobin: 14 g/dL (ref 13.0–17.7)
Immature Grans (Abs): 0 10*3/uL (ref 0.0–0.1)
Immature Granulocytes: 0 %
Lymphocytes Absolute: 1.2 10*3/uL (ref 0.7–3.1)
Lymphs: 18 %
MCH: 31 pg (ref 26.6–33.0)
MCHC: 33 g/dL (ref 31.5–35.7)
MCV: 94 fL (ref 79–97)
Monocytes Absolute: 0.4 10*3/uL (ref 0.1–0.9)
Monocytes: 7 %
Neutrophils Absolute: 4.3 10*3/uL (ref 1.4–7.0)
Neutrophils: 65 %
Platelets: 263 10*3/uL (ref 150–450)
RBC: 4.51 x10E6/uL (ref 4.14–5.80)
RDW: 15.7 % — ABNORMAL HIGH (ref 11.6–15.4)
WBC: 6.5 10*3/uL (ref 3.4–10.8)

## 2023-11-19 LAB — COMPREHENSIVE METABOLIC PANEL
ALT: 42 IU/L (ref 0–44)
AST: 34 IU/L (ref 0–40)
Albumin: 3.9 g/dL (ref 3.9–4.9)
Alkaline Phosphatase: 58 IU/L (ref 44–121)
BUN/Creatinine Ratio: 16 (ref 10–24)
BUN: 17 mg/dL (ref 8–27)
Bilirubin Total: 0.4 mg/dL (ref 0.0–1.2)
CO2: 23 mmol/L (ref 20–29)
Calcium: 9.6 mg/dL (ref 8.6–10.2)
Chloride: 103 mmol/L (ref 96–106)
Creatinine, Ser: 1.06 mg/dL (ref 0.76–1.27)
Globulin, Total: 2.8 g/dL (ref 1.5–4.5)
Glucose: 80 mg/dL (ref 70–99)
Potassium: 4.4 mmol/L (ref 3.5–5.2)
Sodium: 139 mmol/L (ref 134–144)
Total Protein: 6.7 g/dL (ref 6.0–8.5)
eGFR: 79 mL/min/{1.73_m2} (ref >59)

## 2023-11-19 LAB — LIPASE: Lipase: 40 U/L (ref 13–78)

## 2023-11-19 LAB — IRON: Iron: 89 ug/dL (ref 38–169)

## 2023-11-19 NOTE — Progress Notes (Signed)
 Thankfully your liver tests are back to normal, electrolytes kidney and blood counts okay.  I need you to check your blood pressures once or twice a week.  The goal is 120/70.  However if you are losing additional weight and if your blood pressure drops down close to 115/65 or less then we may need to cut back on some of your blood pressure medication  Send me some blood pressure readings in 1 month

## 2023-12-01 ENCOUNTER — Other Ambulatory Visit: Payer: Self-pay | Admitting: Cardiology

## 2023-12-05 ENCOUNTER — Other Ambulatory Visit: Payer: Self-pay | Admitting: Cardiology

## 2024-02-13 ENCOUNTER — Other Ambulatory Visit: Payer: Self-pay | Admitting: Medical

## 2024-02-13 DIAGNOSIS — K219 Gastro-esophageal reflux disease without esophagitis: Secondary | ICD-10-CM

## 2024-02-15 NOTE — Telephone Encounter (Signed)
Patient has an appt in January

## 2024-03-09 ENCOUNTER — Other Ambulatory Visit: Payer: Self-pay | Admitting: Medical

## 2024-03-09 NOTE — Telephone Encounter (Signed)
 Per cardiology note in 07/2023 pt discontinued Zetia 

## 2024-06-03 ENCOUNTER — Other Ambulatory Visit: Payer: Self-pay | Admitting: Medical

## 2024-06-03 DIAGNOSIS — K219 Gastro-esophageal reflux disease without esophagitis: Secondary | ICD-10-CM

## 2024-06-03 NOTE — Telephone Encounter (Signed)
 Appt in [place]

## 2024-07-19 ENCOUNTER — Ambulatory Visit: Payer: Medicare HMO

## 2024-07-19 VITALS — BP 128/80 | HR 72 | Temp 97.5°F | Ht 68.0 in | Wt 238.2 lb

## 2024-07-19 DIAGNOSIS — Z0000 Encounter for general adult medical examination without abnormal findings: Secondary | ICD-10-CM

## 2024-07-19 NOTE — Patient Instructions (Addendum)
 [name],  Thank you for taking the time for your Medicare Wellness Visit. I appreciate your continued commitment to your health goals. Please review the care plan we discussed, and feel free to reach out if I can assist you further.  Please note that Annual Wellness Visits do not include a physical exam. Some assessments may be limited, especially if the visit was conducted virtually. If needed, we may recommend an in-person follow-up with your provider.  Ongoing Care Seeing your primary care provider every 3 to 6 months helps us  monitor your health and provide consistent, personalized care.   Referrals If a referral was made during today's visit and you haven't received any updates within two weeks, please contact the referred provider directly to check on the status.  Recommended Screenings:  Health Maintenance  Topic Date Due   Pneumococcal Vaccine for age over 46 (2 of 2 - PCV) 11/02/2012   DTaP/Tdap/Td vaccine (3 - Td or Tdap) 02/17/2024   Flu Shot  04/08/2024   Medicare Annual Wellness Visit  07/19/2025   Cologuard (Stool DNA test)  10/11/2026   Hepatitis C Screening  Completed   HIV Screening  Completed   Hepatitis B Vaccine  Aged Out   HPV Vaccine  Aged Out   Meningitis B Vaccine  Aged Out   Colon Cancer Screening  Discontinued   COVID-19 Vaccine  Discontinued   Zoster (Shingles) Vaccine  Discontinued       07/19/2024   10:44 AM  Advanced Directives  Does Patient Have a Medical Advance Directive? Yes  Type of Advance Directive Out of facility DNR (pink MOST or yellow form)    Vision: Annual vision screenings are recommended for early detection of glaucoma, cataracts, and diabetic retinopathy. These exams can also reveal signs of chronic conditions such as diabetes and high blood pressure.  Dental: Annual dental screenings help detect early signs of oral cancer, gum disease, and other conditions linked to overall health, including heart disease and  diabetes.  Please see the attached documents for additional preventive care recommendations.

## 2024-07-19 NOTE — Progress Notes (Signed)
 Subjective:   [name] is a 63 y.o. male who presents for a Medicare Annual Wellness Visit.  Allergies (verified) Atorvastatin  and Tomato   History: Past Medical History:  Diagnosis Date   Asthma    mild, no inhaler used   Bilateral renal cysts 04/25/2019   CHF (congestive heart failure) (HCC)    DJD (degenerative joint disease)    has taken tramadol  50mg  q8h in past   Elevated LDL cholesterol level 06/19/2019   Esophageal reflux 04/23/2016   GERD (gastroesophageal reflux disease)    Hyperlipidemia    Hypertension    Morbid obesity (HCC)    weighed in excess of 500 lbs at one point   OSA (obstructive sleep apnea)    did not tolerate CPAP, uses nasal cannula   Proteinuria    GFR 94.12 February 2015   Past Surgical History:  Procedure Laterality Date   COLONOSCOPY N/A 04/22/2013   Procedure: COLONOSCOPY;  Surgeon: Alm VEAR Angle, MD;  Location: [facility];  Service: General;  Laterality: N/A;   leg surgery for fracture Right 2008 or 2009   SPHINCTEROTOMY Left 04/22/2013   Procedure: Exam under anesthesia, left lateral internal SPHINCTEROTOMY;  Surgeon: Alm VEAR Angle, MD;  Location: [facility];  Service: General;  Laterality: Left;   Family History  Problem Relation Age of Onset   Heart disease Father        MI, DM2, HTN   Hypertension Mother        41 DM2, HTN   Cancer Mother    Diabetes Brother    Stroke Brother    Colon cancer Neg Hx    Esophageal cancer Neg Hx    Inflammatory bowel disease Neg Hx    Liver disease Neg Hx    Pancreatic cancer Neg Hx    Rectal cancer Neg Hx    Stomach cancer Neg Hx    Social History   Occupational History   Not on file  Tobacco Use   Smoking status: Former    Current packs/day: 0.00    Average packs/day: 0.2 packs/day for 1.5 years (0.3 ttl pk-yrs)    Types: Cigarettes    Start date: 03/1979    Quit date: 1982    Years since quitting: 43.8   Smokeless tobacco: Never  Vaping Use   Vaping status: Never Used  Substance and  Sexual Activity   Alcohol use: Not Currently    Comment: ocassionally   Drug use: No   Sexual activity: Yes    Partners: Female   Tobacco Counseling Counseling given: Not Answered  SDOH Screenings   Food Insecurity: Food Insecurity Present (07/19/2024)  Housing: Unknown (07/19/2024)  Transportation Needs: No Transportation Needs (07/19/2024)  Utilities: Not At Risk (07/19/2024)  Alcohol Screen: Low Risk  (07/19/2024)  Depression (PHQ2-9): Low Risk  (07/19/2024)  Financial Resource Strain: Low Risk  (07/19/2024)  Physical Activity: Inactive (07/19/2024)  Social Connections: Moderately Isolated (07/19/2024)  Stress: No Stress Concern Present (07/19/2024)  Tobacco Use: Medium Risk (07/19/2024)  Health Literacy: Adequate Health Literacy (07/19/2024)   Depression Screen    07/19/2024   10:40 AM 10/01/2023    9:22 AM 07/14/2023   11:33 AM 01/07/2023   10:02 AM 09/29/2022    9:30 AM 05/13/2022    3:54 PM 09/27/2021   11:19 AM  PHQ 2/9 Scores  PHQ - 2 Score 0 0 0 0 0 0 0  PHQ- 9 Score 0  0  Data saved with a previous flowsheet row definition     Goals Addressed             This Visit's Progress    Patient Stated       07/19/2024, wants to lose weight       Visit info / Clinical Intake: Medicare Wellness Visit Type:: Subsequent Annual Wellness Visit Persons participating in visit:: patient Medicare Wellness Visit Mode:: In-person (required for WTM) Information given by:: patient Interpreter Needed?: No Pre-visit prep was completed: yes AWV questionnaire completed by patient prior to visit?: no Living arrangements:: (!) lives alone Patient's Overall Health Status Rating: very good Typical amount of pain: some Does pain affect daily life?: no Are you currently prescribed opioids?: no  Dietary Habits and Nutritional Risks How many meals a day?: 2 Eats fruit and vegetables daily?: yes Most meals are obtained by: preparing own meals Diabetic::  no  Functional Status Activities of Daily Living (to include ambulation/medication): Independent Ambulation: Independent Medication Administration: Independent Home Management: Independent Manage your own finances?: yes Primary transportation is: driving Concerns about vision?: no *vision screening is required for WTM* Concerns about hearing?: no  Fall Screening Falls in the past year?: 0 Number of falls in past year: 0 Was there an injury with Fall?: 0 Fall Risk Category Calculator: 0 Patient Fall Risk Level: Low Fall Risk  Fall Risk Patient at Risk for Falls Due to: Medication side effect Fall risk Follow up: Falls prevention discussed; Falls evaluation completed  Home and Transportation Safety: All rugs have non-skid backing?: N/A, no rugs All stairs or steps have railings?: N/A, no stairs (has a ramp) Grab bars in the bathtub or shower?: (!) no Have non-skid surface in bathtub or shower?: yes Good home lighting?: yes Regular seat belt use?: yes Hospital stays in the last year:: no  Cognitive Assessment Difficulty concentrating, remembering, or making decisions? : no Will 6CIT or Mini Cog be Completed: yes What year is it?: 0 points What month is it?: 0 points Give patient an address phrase to remember (5 components): [address] About what time is it?: 0 points Count backwards from 20 to 1: 0 points Say the months of the year in reverse: 4 points Repeat the address phrase from earlier: 2 points 6 CIT Score: 6 points  Advance Directives (For Healthcare) Does Patient Have a Medical Advance Directive?: Yes Type of Advance Directive: Out of facility DNR (pink MOST or yellow form) Out of facility DNR (pink MOST or yellow form) in Chart? (Ambulatory ONLY): Yes - validated most recent copy scanned in chart  Reviewed/Updated  Reviewed/Updated: Reviewed All (Medical, Surgical, Family, Medications, Allergies, Care Teams, Patient Goals)        Objective:     Today's Vitals   07/19/24 1036 07/19/24 1054  BP: (!) 148/90 128/80  Pulse: 72   Temp: (!) 97.5 F (36.4 C)   TempSrc: Oral   Weight: 238 lb 3.2 oz (108 kg)   Height: 5' 8 (1.727 m)    Body mass index is 36.22 kg/m.  Current Medications (verified) Outpatient Encounter Medications as of 07/19/2024  Medication Sig   carvedilol  (COREG ) 12.5 MG tablet Take 1 tablet (12.5 mg total) by mouth 2 (two) times daily with a meal.   irbesartan  (AVAPRO ) 300 MG tablet TAKE 1 TABLET BY MOUTH EVERY DAY (Patient taking differently: Take 300 mg by mouth as needed.)   omeprazole  (PRILOSEC) 20 MG capsule TAKE 1 CAPSULE BY MOUTH EVERY DAY  atorvastatin  (LIPITOR) 80 MG tablet TAKE 1 TABLET BY MOUTH EVERY DAY (Patient not taking: Reported on 07/19/2024)   No facility-administered encounter medications on file as of 07/19/2024.   Hearing/Vision screen Hearing Screening - Comments:: Denies hearing issues Vision Screening - Comments:: No regular eye exams Immunizations and Health Maintenance Health Maintenance  Topic Date Due   Pneumococcal Vaccine: 50+ Years (2 of 2 - PCV) 11/02/2012   DTaP/Tdap/Td (3 - Td or Tdap) 02/17/2024   Influenza Vaccine  04/08/2024   Medicare Annual Wellness (AWV)  07/19/2025   Fecal DNA (Cologuard)  10/11/2026   Hepatitis C Screening  Completed   HIV Screening  Completed   Hepatitis B Vaccines 19-59 Average Risk  Aged Out   HPV VACCINES  Aged Out   Meningococcal B Vaccine  Aged Out   Colonoscopy  Discontinued   COVID-19 Vaccine  Discontinued   Zoster Vaccines- Shingrix  Discontinued        Assessment/Plan:  This is a routine wellness examination for Harshil.  Patient Care Team: Tysinger, Alm RAMAN, PA-C as PCP - General (Family Medicine) Jeffrie Oneil BROCKS, MD as Consulting Physician (Cardiology)  I have personally reviewed and noted the following in the patient's chart:   Medical and social history Use of alcohol, tobacco or illicit drugs  Current medications and  supplements including opioid prescriptions. Functional ability and status Nutritional status Physical activity Advanced directives List of other physicians Hospitalizations, surgeries, and ER visits in previous 12 months Vitals Screenings to include cognitive, depression, and falls Referrals and appointments  No orders of the defined types were placed in this encounter.  In addition, I have reviewed and discussed with patient certain preventive protocols, quality metrics, and best practice recommendations. A written personalized care plan for preventive services as well as general preventive health recommendations were provided to patient.   Ardella FORBES Dawn, LPN   [id number]   Return in 1 year (on 07/19/2025).  After Visit Summary: (In Person-Printed) AVS printed and given to the patient  Nurse Notes: Declines vaccines

## 2024-08-31 ENCOUNTER — Other Ambulatory Visit: Payer: Self-pay | Admitting: Cardiology

## 2024-08-31 MED ORDER — IRBESARTAN 300 MG PO TABS: 300.0000 mg | ORAL_TABLET | Freq: Every day | ORAL | 0 refills | Status: DC

## 2024-09-22 ENCOUNTER — Other Ambulatory Visit: Payer: Self-pay | Admitting: Cardiology

## 2024-09-25 ENCOUNTER — Other Ambulatory Visit: Payer: Self-pay | Admitting: Cardiology

## 2024-10-05 ENCOUNTER — Encounter: Payer: Medicare HMO | Admitting: Medical

## 2024-10-21 ENCOUNTER — Ambulatory Visit: Admitting: Medical

## 2025-07-25 ENCOUNTER — Ambulatory Visit
